# Patient Record
Sex: Female | Born: 1970 | Race: Black or African American | Hispanic: No | Marital: Single
Health system: Midwestern US, Community
[De-identification: ages and names within clinical notes are randomized; demographics above are authoritative.]

## PROBLEM LIST (undated history)

## (undated) DIAGNOSIS — M5126 Other intervertebral disc displacement, lumbar region: Secondary | ICD-10-CM

## (undated) DIAGNOSIS — I10 Essential (primary) hypertension: Secondary | ICD-10-CM

## (undated) DIAGNOSIS — D649 Anemia, unspecified: Secondary | ICD-10-CM

## (undated) DIAGNOSIS — R87619 Unspecified abnormal cytological findings in specimens from cervix uteri: Secondary | ICD-10-CM

## (undated) DIAGNOSIS — N3941 Urge incontinence: Principal | ICD-10-CM

## (undated) DIAGNOSIS — D509 Iron deficiency anemia, unspecified: Principal | ICD-10-CM

## (undated) DIAGNOSIS — E559 Vitamin D deficiency, unspecified: Secondary | ICD-10-CM

## (undated) DIAGNOSIS — R718 Other abnormality of red blood cells: Secondary | ICD-10-CM

## (undated) DIAGNOSIS — A6004 Herpesviral vulvovaginitis: Secondary | ICD-10-CM

## (undated) DIAGNOSIS — R928 Other abnormal and inconclusive findings on diagnostic imaging of breast: Secondary | ICD-10-CM

## (undated) DIAGNOSIS — R9341 Abnormal radiologic findings on diagnostic imaging of renal pelvis, ureter, or bladder: Secondary | ICD-10-CM

## (undated) DIAGNOSIS — Z0189 Encounter for other specified special examinations: Secondary | ICD-10-CM

## (undated) HISTORY — DX: Anemia, unspecified: D64.9

## (undated) HISTORY — DX: Unspecified abnormal cytological findings in specimens from cervix uteri: R87.619

---

## 1997-10-09 ENCOUNTER — Emergency Department (HOSPITAL_COMMUNITY): Admission: EM | Admit: 1997-10-09 | Discharge: 1997-10-09 | Payer: Self-pay

## 2002-10-27 LAB — HM PAP SMEAR: HM Pap smear: ABNORMAL

## 2003-03-22 HISTORY — PX: TUBAL LIGATION: SHX77

## 2003-04-20 ENCOUNTER — Inpatient Hospital Stay (HOSPITAL_COMMUNITY): Admission: AD | Admit: 2003-04-20 | Discharge: 2003-04-20 | Payer: Self-pay | Admitting: Family Medicine

## 2003-07-07 ENCOUNTER — Ambulatory Visit (HOSPITAL_COMMUNITY): Admission: RE | Admit: 2003-07-07 | Discharge: 2003-07-07 | Payer: Self-pay | Admitting: Obstetrics & Gynecology

## 2003-10-15 ENCOUNTER — Inpatient Hospital Stay (HOSPITAL_COMMUNITY): Admission: AD | Admit: 2003-10-15 | Discharge: 2003-10-15 | Payer: Self-pay | Admitting: Obstetrics & Gynecology

## 2003-11-06 ENCOUNTER — Inpatient Hospital Stay (HOSPITAL_COMMUNITY): Admission: AD | Admit: 2003-11-06 | Discharge: 2003-11-06 | Payer: Self-pay | Admitting: Obstetrics & Gynecology

## 2003-11-20 ENCOUNTER — Inpatient Hospital Stay (HOSPITAL_COMMUNITY): Admission: AD | Admit: 2003-11-20 | Discharge: 2003-11-24 | Payer: Self-pay | Admitting: Obstetrics & Gynecology

## 2003-11-21 ENCOUNTER — Encounter (INDEPENDENT_AMBULATORY_CARE_PROVIDER_SITE_OTHER): Payer: Self-pay | Admitting: Specialist

## 2010-02-14 ENCOUNTER — Emergency Department (HOSPITAL_COMMUNITY): Admission: EM | Admit: 2010-02-14 | Discharge: 2010-02-14 | Payer: Self-pay | Admitting: Emergency Medicine

## 2010-08-06 NOTE — Op Note (Signed)
Ruth Schmidt, Ruth Schmidt                          ACCOUNT NO.:  [id number]   MEDICAL RECORD NO.:  [id number]                   PATIENT TYPE:  INP   LOCATION:  9128                                 FACILITY:  [facility]   PHYSICIAN:  Roseanna Rainbow, M.D.         DATE OF BIRTH:  03-Feb-1971   DATE OF PROCEDURE:  11/21/2003  DATE OF DISCHARGE:                                 OPERATIVE REPORT   PREOPERATIVE DIAGNOSIS:  1.  Intrauterine pregnancy at term.  2.  Pregnancy induced hypertension.  3.  Suspicious fetal heart tracing.  4.  Multiparity, desires permanent sterilization.  5.  Latent labor.   POSTOPERATIVE DIAGNOSIS:  1.  Intrauterine pregnancy at term.  2.  Pregnancy induced hypertension.  3.  Suspicious fetal heart tracing.  4.  Multiparity, desires permanent sterilization.  5.  Latent labor.   PROCEDURE:  1.  Primary low uterine flap elective cesarean delivery.  2.  Modified Pomeroy bilateral tubal ligation via Pfannenstiel incision.   SURGEON:  Roseanna Rainbow, M.D.   ANESTHESIA:  Epidural.   ESTIMATED BLOOD LOSS:  800 mL.   FLUIDS/URINE OUTPUT:  As per anesthesiology.   COMPLICATIONS:  None.   DESCRIPTION OF PROCEDURE:  The patient was taken to the operating room with  an epidural catheter in place.  She was then placed in the dorsal supine  position with a leftward tilt and prepped and draped in the usual sterile  fashion.  A Pfannenstiel skin incision was then made with the scalpel and  carried down to the underlying layer of fascia.  The fascia was nicked in  the midline.  The fascial incision was then extended bilaterally with curved  Mayo scissors.  The superior aspect of the fascial incision was then grasped  with Kocher clamps and extended up in the underlying rectus muscles,  dissected up.  The inferior aspect of the fascial incision was then  manipulated in a similar fashion.  The rectus muscles were separated in the  midline.  The parietal peritoneum  was tented up and entered sharply.  The  peritoneal incision was then extended superiorly and inferiorly with good  visualization of the bladder. The bladder blade was then placed.  The  vesicouterine peritoneum was tented up and entered sharply.  The incision  was extended bilaterally and the bladder flap created bluntly.  The bladder  blade was then replaced.  The lower uterine segment was incised in a  transverse fashion with the scalpel.  The uterine incision was then extended  bluntly.  The infant's head was then delivered atraumatically.  The nose and  mouth were suctioned with the bulb suction.  A loose nuchal cord was noted  that was easily reduced.  The cord was clamped and cut.  The infant was  handed off to the waiting neonatologist.  Apgars were 9.  An umbilical  artery pH was 7.28.  The placenta was then removed.  The uterus was  exteriorized and evacuated of any remaining blood clots, amniotic fluid and  debris with a moistened laparotomy sponge.  The uterine incision was then  reapproximated in a running interlocking fashion.  Adequate hemostasis was  noted.  At this point, the mid isthmic portion of the left fallopian tube  was grasped with a Babcock clamp.  A 2-3 cm segment of tube was doubly  ligated with ties of plain gut and excised.  The right fallopian tube was  then manipulated in a similar fashion.  The uterus was returned to the  abdomen.  The pericolic gutters were then copiously irrigated.  The parietal  peritoneum was then reapproximated in a running fashion with 2-0 Vicryl.  The fascia was reapproximated in a running fashion with 0 Vicryl.  The skin  was reapproximated with staples.  At the close the procedure, the instrument  and pack counts were said to be correct x2.  The patient was taken to the  PACU, awake and in stable condition.                                               Roseanna Rainbow, M.D.    Judee Clara  D:  11/21/2003  T:  11/23/2003   Job:  [id number]

## 2010-08-06 NOTE — Discharge Summary (Signed)
Ruth Schmidt, Ruth Schmidt                ACCOUNT NO.:  [id number]   MEDICAL RECORD NO.:  [id number]          PATIENT TYPE:  INP   LOCATION:  9128                          FACILITY:  [facility]   PHYSICIAN:  Roseanna Rainbow, M.D.DATE OF BIRTH:  [birth year]   DATE OF ADMISSION:  11/20/2003  DATE OF DISCHARGE:  11/24/2003                                 DISCHARGE SUMMARY   CHIEF COMPLAINT:  The patient is a 40 year old para 2 with an estimated date  of confinement of December 06, 2003 with an intrauterine pregnancy at 55  and 5 weeks with elevated blood pressures.   HISTORY OF PRESENT ILLNESS:  The patient presented to the office complaining  of decreased fetal movement.  A subsequent nonstress test was nonreactive.  Blood pressures were elevated in the office with systolics in the 140s to  160s and diastolics in the 80s to 100s.  She complained of a mild headache  relieved with Tylenol.  She reported increased swelling in her lower  extremities and face.  Biophysical profile was 8/8 and an NST was reactive  in the maternity admissions unit on the day of admission.  Antepartum  course, problems, risks:  Pap smear with high-grade dysplasia.  Dating was  consistent with an ultrasound at 18 weeks 2 days.  GBS was positive on July  27.  Hemoglobin 11.6; platelets 216,000.  Blood type O positive, antibody  screen negative, RPR nonreactive, rubella immune, hepatitis B surface  antigen negative, HIV nonreactive, urine culture and sensitivity no growth,  Pap CIN-3.  Quad screen within normal limits.   PAST OBSTETRICAL AND GYNECOLOGICAL HISTORY:  In 1993 she was delivered of a  female, 5 pounds 11 ounces, spontaneous vaginal delivery full-term.  In 1998  she was delivered of a female, 6 pounds 11 ounces, full-term spontaneous  vaginal delivery.  She has had one voluntary termination of pregnancy.   PAST MEDICAL HISTORY:  She denies.   PAST SURGICAL HISTORY:  She denies.   SOCIAL HISTORY:  No  tobacco, ethanol, or drug use.   FAMILY HISTORY:  Hypertension, COPD.   MEDICATIONS:  Prenatal vitamins.   ALLERGIES:  No known drug allergies.   PHYSICAL EXAMINATION:  VITAL SIGNS:  Blood pressure is 140s to 160s over 80s  to 100s.  Fetal heart tracing reassuring.  Tocodynamometer with irregular  uterine contractions.  HEENT:  There is mild facial edema.  ABDOMEN:  Gravid.  PELVIC:  Sterile vaginal exam:  Cervix was fingertip to 1, 50%, with the  vertex at a -3 station.  EXTREMITIES:  Trace to 1+ lower extremity edema.  NEUROLOGIC:  Nonfocal.  Patellar reflexes were 1+ bilaterally.   LABORATORY DATA:  Urinalysis no proteinuria.  Complete metabolic profile  within normal limits.  Uric acid of 5.1.  Hemoglobin was 11.7 and platelets  were 176,000.   ASSESSMENT:  Multipara at 37 weeks with:  1.  Pregnancy-induced hypertension, rule out severe pregnancy-induced      hypertension; no evidence of HELLP syndrome.  2.  Unfavorable Bishop's.  3.  Reassuring fetal heart tracing.  4.  Group B  streptococcus positive.   PLAN:  Admission, induction of labor, penicillin GBS prophylaxis, possible  magnesium sulfate seizure prophylaxis.   HOSPITAL COURSE:  The patient was subsequently admitted and induction of  labor was initiated.  There was possible tachysystole with Cytotec and  efforts were made on intrauterine fetal resuscitation.  Her labor was  complicated by persistent suspicious fetal heart tracing.  She was brought  for a cesarean delivery and bilateral tubal ligation.  Please see the  dictated operative summary for further details.  Her postoperative course  was uneventful.  Her blood pressures remained in the normotensive range.  She was discharged to home on postoperative day #3 tolerating a regular  diet.   DISCHARGE DIAGNOSES:  1.  Mild preeclampsia.  2.  Intrauterine pregnancy at 37 weeks.  3.  Suspicious fetal heart tracing.  4.  Protracted latent phase of labor.    PROCEDURES:  Cesarean delivery and bilateral tubal ligation.   CONDITION:  Stable.   DIET:  Regular.   MEDICATIONS:  Resume home medications and Tylox.   ACTIVITY:  No strenuous activity, pelvic rest.   DISPOSITION:  The patient was to follow up in 1 week in the office.      LAJ/MEDQ  D:  12/10/2003  T:  12/11/2003  Job:  [id number]

## 2011-04-27 ENCOUNTER — Emergency Department (HOSPITAL_COMMUNITY): Payer: No Typology Code available for payment source

## 2011-04-27 ENCOUNTER — Encounter (HOSPITAL_COMMUNITY): Payer: Self-pay | Admitting: Emergency Medicine

## 2011-04-27 ENCOUNTER — Emergency Department (HOSPITAL_COMMUNITY)
Admission: EM | Admit: 2011-04-27 | Discharge: 2011-04-27 | Disposition: A | Discharge status: Home / Self Care | Payer: No Typology Code available for payment source | Attending: Emergency Medicine | Admitting: Emergency Medicine

## 2011-04-27 DIAGNOSIS — Z79899 Other long term (current) drug therapy: Secondary | ICD-10-CM | POA: Insufficient documentation

## 2011-04-27 DIAGNOSIS — I10 Essential (primary) hypertension: Secondary | ICD-10-CM | POA: Insufficient documentation

## 2011-04-27 DIAGNOSIS — M546 Pain in thoracic spine: Secondary | ICD-10-CM | POA: Insufficient documentation

## 2011-04-27 DIAGNOSIS — M542 Cervicalgia: Secondary | ICD-10-CM | POA: Insufficient documentation

## 2011-04-27 DIAGNOSIS — R209 Unspecified disturbances of skin sensation: Secondary | ICD-10-CM | POA: Insufficient documentation

## 2011-04-27 HISTORY — DX: Essential (primary) hypertension: I10

## 2011-04-27 MED ORDER — IBUPROFEN 800 MG PO TABS
800.0000 mg | ORAL_TABLET | Freq: Once | ORAL | Status: AC
  Administered 2011-04-27: 800 mg via ORAL
  Filled 2011-04-27: qty 1

## 2011-04-27 MED ORDER — NAPROXEN 500 MG PO TABS: 500.0000 mg | ORAL_TABLET | Freq: Two times a day (BID) | ORAL | Status: DC

## 2011-04-27 MED ORDER — TRAMADOL HCL 50 MG PO TABS: 50.0000 mg | ORAL_TABLET | Freq: Four times a day (QID) | ORAL | Status: AC | PRN

## 2011-04-27 MED ORDER — LORAZEPAM 1 MG PO TABS
1.0000 mg | ORAL_TABLET | Freq: Once | ORAL | Status: AC
  Administered 2011-04-27: 1 mg via ORAL
  Filled 2011-04-27: qty 1
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
## 2012-03-26 ENCOUNTER — Encounter (HOSPITAL_COMMUNITY): Payer: Self-pay | Admitting: *Deleted

## 2012-03-26 ENCOUNTER — Emergency Department (HOSPITAL_COMMUNITY)
Admission: EM | Admit: 2012-03-26 | Discharge: 2012-03-26 | Disposition: A | Discharge status: Home / Self Care | Payer: Self-pay | Attending: Emergency Medicine | Admitting: Emergency Medicine

## 2012-03-26 ENCOUNTER — Emergency Department (HOSPITAL_COMMUNITY): Payer: Self-pay

## 2012-03-26 DIAGNOSIS — R112 Nausea with vomiting, unspecified: Secondary | ICD-10-CM | POA: Insufficient documentation

## 2012-03-26 DIAGNOSIS — I10 Essential (primary) hypertension: Secondary | ICD-10-CM | POA: Insufficient documentation

## 2012-03-26 DIAGNOSIS — R109 Unspecified abdominal pain: Secondary | ICD-10-CM

## 2012-03-26 DIAGNOSIS — Z3202 Encounter for pregnancy test, result negative: Secondary | ICD-10-CM | POA: Insufficient documentation

## 2012-03-26 LAB — CBC WITH DIFFERENTIAL/PLATELET
Basophils Relative: 0 % (ref 0–1)
Eosinophils Absolute: 0 10*3/uL (ref 0.0–0.7)
Eosinophils Relative: 1 % (ref 0–5)
HCT: 33.4 % — ABNORMAL LOW (ref 36.0–46.0)
Hemoglobin: 10.6 g/dL — ABNORMAL LOW (ref 12.0–15.0)
MCH: 26.1 pg (ref 26.0–34.0)
MCHC: 31.7 g/dL (ref 30.0–36.0)
MCV: 82.3 fL (ref 78.0–100.0)
Monocytes Absolute: 0.5 10*3/uL (ref 0.1–1.0)
Monocytes Relative: 19 % — ABNORMAL HIGH (ref 3–12)

## 2012-03-26 LAB — COMPREHENSIVE METABOLIC PANEL
Alkaline Phosphatase: 44 U/L (ref 39–117)
BUN: 8 mg/dL (ref 6–23)
Calcium: 9.6 mg/dL (ref 8.4–10.5)
GFR calc Af Amer: >90 mL/min (ref >90)
Glucose, Bld: 97 mg/dL (ref 70–99)
Potassium: 4.1 mEq/L (ref 3.5–5.1)
Total Protein: 7.9 g/dL (ref 6.0–8.3)

## 2012-03-26 LAB — URINALYSIS, ROUTINE W REFLEX MICROSCOPIC
Leukocytes, UA: NEGATIVE
Nitrite: NEGATIVE
pH: 7 (ref 5.0–8.0)

## 2012-03-26 MED ORDER — SODIUM CHLORIDE 0.9 % IV BOLUS (SEPSIS)
1000.0000 mL | Freq: Once | INTRAVENOUS | Status: AC
  Administered 2012-03-26: 1000 mL via INTRAVENOUS

## 2012-03-26 MED ORDER — ONDANSETRON HCL 4 MG/2ML IJ SOLN
4.0000 mg | Freq: Once | INTRAMUSCULAR | Status: AC
  Administered 2012-03-26: 4 mg via INTRAVENOUS
  Filled 2012-03-26: qty 2

## 2012-03-26 MED ORDER — PANTOPRAZOLE SODIUM 40 MG IV SOLR
40.0000 mg | Freq: Once | INTRAVENOUS | Status: AC
  Administered 2012-03-26: 40 mg via INTRAVENOUS
  Filled 2012-03-26: qty 40

## 2012-03-26 MED ORDER — ONDANSETRON HCL 8 MG PO TABS: 8.0000 mg | ORAL_TABLET | Freq: Three times a day (TID) | ORAL | Status: DC | PRN

## 2012-03-26 NOTE — Progress Notes (Signed)
During her 03/26/12 [facility] ED visit, Pt listed with no insurance coverage. CM and [facility] liaison spoke with pt.  Pt offered services to assist with finding a [place] self pay provider & health reform information Pt states her coverage for Blue cross and blue shield will be on April 21 2012 and pcp is Tamela Oddi (CM unable to find pcp listed in EPIC)

## 2012-03-26 NOTE — ED Notes (Signed)
Pt reports generalized abd pain x4 days, worse since last night. +nausea, and "making herself vomit to feel better." Thought she had just ate something bad. Chills at home. Also reports spotting this am.

## 2012-03-26 NOTE — ED Provider Notes (Signed)
History     CSN: [id number]  Arrival date & time 03/26/12  0744   First MD Initiated Contact with Patient 03/26/12 [phone]      Chief Complaint  Patient presents with  . Abdominal Pain    (Consider location/radiation/quality/duration/timing/severity/associated sxs/prior treatment) Patient is a 42 y.o. female presenting with abdominal pain. The history is provided by the patient.  Abdominal Pain The primary symptoms of the illness include abdominal pain. The primary symptoms of the illness do not include fever or shortness of breath.  Symptoms associated with the illness do not include chills or back pain.  pt c/o mid/diffuse abd crampy pain for past few days w intermittent nv. Emesis color of recent ingested food/drink, then sl yellowish. No bloody or bilious emesis. Having normal bms. No diarrhea or constipation.  No focal or constant abd pain. No radiation of pain or back/flank pain. No specific exacerbating or alleviating factors. No fever or chills. No dysuria or gu c/o. No vaginal discharge or bleeding. lnmp 2 weeks ago. No hx pud, gallstones, or pancreatitis. Only prior abd surgery was c section.   Past Medical History  Diagnosis Date  . Hypertension     Past Surgical History  Procedure Date  . Cesarean section     No family history on file.  History  Substance Use Topics  . Smoking status: Never Smoker   . Smokeless tobacco: Not on file  . Alcohol Use: Yes     Comment: occasional    OB History    Grav Para Term Preterm Abortions TAB SAB Ect Mult Living                  Review of Systems  Constitutional: Negative for fever and chills.  HENT: Negative for neck pain.   Eyes: Negative for redness.  Respiratory: Negative for shortness of breath.   Cardiovascular: Negative for chest pain.  Gastrointestinal: Positive for abdominal pain.  Genitourinary: Negative for flank pain.  Musculoskeletal: Negative for back pain.  Skin: Negative for rash.  Neurological:  Negative for headaches.  Hematological: Does not bruise/bleed easily.  Psychiatric/Behavioral: Negative for confusion.    Allergies  Review of patient's allergies indicates no known allergies.  Home Medications   Current Outpatient Rx  Name  Route  Sig  Dispense  Refill  . BISMUTH SUBSALICYLATE 262 MG/15ML PO SUSP   Oral   Take 15 mLs by mouth every 6 (six) hours as needed. For symptoms.         Marland Kitchen ALKA-SELTZER PLUS MUCUS & CONG PO   Oral   Take 2 capsules by mouth at bedtime as needed. For symptoms.           BP 155/97  Pulse 90  Temp 98.2 F (36.8 C) (Oral)  Resp 14  SpO2 100%  LMP 03/26/2012  Physical Exam  Nursing note and vitals reviewed. Constitutional: She is oriented to person, place, and time. She appears well-developed and well-nourished. No distress.  HENT:  Mouth/Throat: Oropharynx is clear and moist.  Eyes: Conjunctivae normal are normal. No scleral icterus.  Neck: Neck supple. No tracheal deviation present.  Cardiovascular: Normal rate, regular rhythm, normal heart sounds and intact distal pulses.   Pulmonary/Chest: Effort normal and breath sounds normal. No respiratory distress.  Abdominal: Soft. Normal appearance and bowel sounds are normal. She exhibits no distension and no mass. There is no tenderness. There is no rebound and no guarding.  Genitourinary:       No cva tenderness  Musculoskeletal: She exhibits no edema and no tenderness.  Neurological: She is alert and oriented to person, place, and time.  Skin: Skin is warm and dry. No rash noted.  Psychiatric: She has a normal mood and affect.    ED Course  Procedures (including critical care time)  Results for orders placed during the hospital encounter of 03/26/12  CBC WITH DIFFERENTIAL      Component Value Range   WBC 2.5 (*) 4.0 - 10.5 K/uL   RBC 4.06  3.87 - 5.11 MIL/uL   Hemoglobin 10.6 (*) 12.0 - 15.0 g/dL   HCT 09.8 (*) 11.9 - 14.7 %   MCV 82.3  78.0 - 100.0 fL   MCH 26.1  26.0 -  34.0 pg   MCHC 31.7  30.0 - 36.0 g/dL   RDW 82.9 (*) 56.2 - 13.0 %   Platelets 297  150 - 400 K/uL   Neutrophils Relative 54  43 - 77 %   Neutro Abs 1.4 (*) 1.7 - 7.7 K/uL   Lymphocytes Relative 25  12 - 46 %   Lymphs Abs 0.6 (*) 0.7 - 4.0 K/uL   Monocytes Relative 19 (*) 3 - 12 %   Monocytes Absolute 0.5  0.1 - 1.0 K/uL   Eosinophils Relative 1  0 - 5 %   Eosinophils Absolute 0.0  0.0 - 0.7 K/uL   Basophils Relative 0  0 - 1 %   Basophils Absolute 0.0  0.0 - 0.1 K/uL  LIPASE, BLOOD      Component Value Range   Lipase 26  11 - 59 U/L  COMPREHENSIVE METABOLIC PANEL      Component Value Range   Sodium 133 (*) 135 - 145 mEq/L   Potassium 4.1  3.5 - 5.1 mEq/L   Chloride 99  96 - 112 mEq/L   CO2 24  19 - 32 mEq/L   Glucose, Bld 97  70 - 99 mg/dL   BUN 8  6 - 23 mg/dL   Creatinine, Ser 8.65  0.50 - 1.10 mg/dL   Calcium 9.6  8.4 - 78.4 mg/dL   Total Protein 7.9  6.0 - 8.3 g/dL   Albumin 3.8  3.5 - 5.2 g/dL   AST 21  0 - 37 U/L   ALT 15  0 - 35 U/L   Alkaline Phosphatase 44  39 - 117 U/L   Total Bilirubin 0.4  0.3 - 1.2 mg/dL   GFR calc non Af Amer >90  >90 mL/min   GFR calc Af Amer >90  >90 mL/min  URINALYSIS, ROUTINE W REFLEX MICROSCOPIC      Component Value Range   Color, Urine YELLOW  YELLOW   APPearance CLEAR  CLEAR   Specific Gravity, Urine 1.025  1.005 - 1.030   pH 7.0  5.0 - 8.0   Glucose, UA NEGATIVE  NEGATIVE mg/dL   Hgb urine dipstick NEGATIVE  NEGATIVE   Bilirubin Urine NEGATIVE  NEGATIVE   Ketones, ur TRACE (*) NEGATIVE mg/dL   Protein, ur NEGATIVE  NEGATIVE mg/dL   Urobilinogen, UA 1.0  0.0 - 1.0 mg/dL   Nitrite NEGATIVE  NEGATIVE   Leukocytes, UA NEGATIVE  NEGATIVE  PREGNANCY, URINE      Component Value Range   Preg Test, Ur NEGATIVE  NEGATIVE   US Abdomen Complete  03/26/2012  *RADIOLOGY REPORT*  Clinical Data:  Abdominal pain.  Evaluate for gallstones.  COMPLETE ABDOMINAL ULTRASOUND  Comparison:  No priors.  Findings:  Gallbladder:  No  shadowing  gallstones or echogenic sludge.  No gallbladder wall thickening or pericholecystic fluid.  Negative sonographic Murphy's sign according to the ultrasound technologist.  Common bile duct:  Normal caliber measuring 3.6 mm in the porta hepatis.  Liver:  Normal size and echotexture without focal parenchymal abnormality.  Patent portal vein with hepatopetal flow.  IVC:  Patent throughout its visualized course in the abdomen.  Pancreas:  Although the pancreas is difficult to visualize in its entirety, no focal pancreatic abnormality is identified.  Spleen:  Normal size and echotexture without focal parenchymal abnormality.5.8 cm in length.  Right Kidney:  No hydronephrosis.  Well-preserved cortex.  Normal size and parenchymal echotexture without focal abnormalities. 11.3 cm in length.  Left Kidney:  No hydronephrosis.  Well-preserved cortex.  Normal size and parenchymal echotexture without focal abnormalities. 11.7 cm in length.  Abdominal aorta:  Normal caliber measuring 1.9 cm proximally, and tapering appropriately distally.  IMPRESSION: 1.  No acute findings in the abdomen to account for the patient's symptoms.  Specifically, no evidence of gallstones or findings to suggest acute cholecystitis at this time.   Original Report Authenticated By: Trudie Reed, M.D.       MDM  Iv ns bolus. zofran iv.  Reviewed nursing notes and prior charts for additional history.   2nd liter ns. protonix iv.  Pt drove self to ed.  Recheck abd soft no tenderness w light/deep palpation.   Recheck no recurrent nv. abd soft nt. Discussed labs and u/s w pt.         Suzi Roots, MD 03/26/12 1122

## 2012-06-13 ENCOUNTER — Ambulatory Visit: Payer: Self-pay | Admitting: Obstetrics & Gynecology

## 2012-06-14 ENCOUNTER — Other Ambulatory Visit: Payer: Self-pay

## 2012-06-14 DIAGNOSIS — Z1231 Encounter for screening mammogram for malignant neoplasm of breast: Secondary | ICD-10-CM

## 2012-06-27 ENCOUNTER — Ambulatory Visit
Admission: RE | Admit: 2012-06-27 | Discharge: 2012-06-27 | Disposition: A | Discharge status: Home / Self Care | Payer: BC Managed Care – PPO | Source: Ambulatory Visit

## 2012-06-27 DIAGNOSIS — Z1231 Encounter for screening mammogram for malignant neoplasm of breast: Secondary | ICD-10-CM

## 2012-06-28 ENCOUNTER — Other Ambulatory Visit: Payer: Self-pay | Admitting: Obstetrics & Gynecology

## 2012-06-28 DIAGNOSIS — R928 Other abnormal and inconclusive findings on diagnostic imaging of breast: Secondary | ICD-10-CM

## 2012-07-09 ENCOUNTER — Encounter: Payer: Self-pay | Admitting: Obstetrics & Gynecology

## 2012-07-11 ENCOUNTER — Ambulatory Visit
Admission: RE | Admit: 2012-07-11 | Discharge: 2012-07-11 | Disposition: A | Discharge status: Home / Self Care | Payer: BC Managed Care – PPO | Source: Ambulatory Visit | Attending: Obstetrics & Gynecology | Admitting: Obstetrics & Gynecology

## 2012-07-11 DIAGNOSIS — R928 Other abnormal and inconclusive findings on diagnostic imaging of breast: Secondary | ICD-10-CM

## 2012-07-11 LAB — HM MAMMOGRAPHY: HM Mammogram: NORMAL

## 2012-07-23 ENCOUNTER — Encounter: Payer: Self-pay | Admitting: Obstetrics & Gynecology

## 2012-07-23 ENCOUNTER — Ambulatory Visit: Payer: Self-pay | Admitting: Obstetrics & Gynecology

## 2012-07-23 ENCOUNTER — Ambulatory Visit (INDEPENDENT_AMBULATORY_CARE_PROVIDER_SITE_OTHER): Payer: BC Managed Care – PPO | Admitting: Obstetrics & Gynecology

## 2012-07-23 VITALS — BP 142/85 | HR 92 | Temp 97.6°F | Ht 63.0 in | Wt 140.0 lb

## 2012-07-23 DIAGNOSIS — Z01419 Encounter for gynecological examination (general) (routine) without abnormal findings: Secondary | ICD-10-CM

## 2012-07-23 NOTE — Progress Notes (Signed)
Subjective:     Ruth Schmidt is a 42 y.o. female here for a routine exam.  Current complaints: none.  Personal health questionnaire reviewed: not asked.   Gynecologic History Patient's last menstrual period was 07/17/2012. Contraception: tubal ligation Last Pap: 2004. Results were: abnormal Last mammogram: 07/11/2012. Results were: normal   The following portions of the patient's history were reviewed and updated as appropriate: allergies, current medications, past family history, past medical history, past social history, past surgical history and problem list.  Review of Systems Pertinent items are noted in HPI.    Objective:    General appearance: alert Breasts: normal appearance, no masses or tenderness Abdomen: soft, non-tender; bowel sounds normal; no masses,  no organomegaly Pelvic: cervix normal in appearance, external genitalia normal, no adnexal masses or tenderness, uterus normal size, irregular shape, small anterior myoma palpated and and vagina normal without discharge    Informal U/S confirmed the pelvic exam  Assessment:    Healthy female exam.    Plan:    Education reviewed: f/u w/a primary care provider.

## 2012-07-23 NOTE — Patient Instructions (Addendum)

## 2012-07-24 ENCOUNTER — Encounter: Payer: Self-pay | Admitting: Obstetrics & Gynecology

## 2012-07-24 DIAGNOSIS — Z0001 Encounter for general adult medical examination with abnormal findings: Secondary | ICD-10-CM | POA: Insufficient documentation

## 2012-07-24 DIAGNOSIS — Z0000 Encounter for general adult medical examination without abnormal findings: Secondary | ICD-10-CM | POA: Insufficient documentation

## 2012-07-24 LAB — RPR

## 2012-07-24 LAB — PAP IG, CT-NG NAA, HPV HIGH-RISK: HPV DNA High Risk: NOT DETECTED

## 2012-12-19 ENCOUNTER — Ambulatory Visit: Payer: BC Managed Care – PPO | Admitting: Internal Medicine

## 2013-01-04 ENCOUNTER — Ambulatory Visit (INDEPENDENT_AMBULATORY_CARE_PROVIDER_SITE_OTHER): Payer: BC Managed Care – PPO | Admitting: Internal Medicine

## 2013-01-04 ENCOUNTER — Encounter: Payer: Self-pay | Admitting: Internal Medicine

## 2013-01-04 ENCOUNTER — Other Ambulatory Visit (INDEPENDENT_AMBULATORY_CARE_PROVIDER_SITE_OTHER): Payer: BC Managed Care – PPO

## 2013-01-04 VITALS — BP 150/110 | HR 99 | Temp 98.6°F | Ht 63.0 in | Wt 146.0 lb

## 2013-01-04 DIAGNOSIS — I10 Essential (primary) hypertension: Secondary | ICD-10-CM

## 2013-01-04 DIAGNOSIS — Z0000 Encounter for general adult medical examination without abnormal findings: Secondary | ICD-10-CM

## 2013-01-04 DIAGNOSIS — D649 Anemia, unspecified: Secondary | ICD-10-CM

## 2013-01-04 DIAGNOSIS — D509 Iron deficiency anemia, unspecified: Secondary | ICD-10-CM | POA: Insufficient documentation

## 2013-01-04 LAB — CBC WITH DIFFERENTIAL/PLATELET
Basophils Relative: 0.6 % (ref 0.0–3.0)
Eosinophils Relative: 0.7 % (ref 0.0–5.0)
HCT: 32.3 % — ABNORMAL LOW (ref 36.0–46.0)
Lymphs Abs: 1.1 10*3/uL (ref 0.7–4.0)
MCHC: 31.6 g/dL (ref 30.0–36.0)
MCV: 85.8 fl (ref 78.0–100.0)
Monocytes Absolute: 0.4 10*3/uL (ref 0.1–1.0)
Monocytes Relative: 10.8 % (ref 3.0–12.0)
Neutrophils Relative %: 61.4 % (ref 43.0–77.0)
Platelets: 275 10*3/uL (ref 150.0–400.0)
RBC: 3.77 Mil/uL — ABNORMAL LOW (ref 3.87–5.11)
WBC: 4.1 10*3/uL — ABNORMAL LOW (ref 4.5–10.5)

## 2013-01-04 LAB — HEPATIC FUNCTION PANEL
AST: 20 U/L (ref 0–37)
Albumin: 4.1 g/dL (ref 3.5–5.2)
Alkaline Phosphatase: 42 U/L (ref 39–117)
Bilirubin, Direct: 0.1 mg/dL (ref 0.0–0.3)
Total Bilirubin: 0.6 mg/dL (ref 0.3–1.2)
Total Protein: 7.8 g/dL (ref 6.0–8.3)

## 2013-01-04 LAB — URINALYSIS, ROUTINE W REFLEX MICROSCOPIC
Ketones, ur: NEGATIVE
Leukocytes, UA: NEGATIVE
Nitrite: NEGATIVE
Specific Gravity, Urine: 1.025 (ref 1.000–1.030)
Urine Glucose: NEGATIVE
pH: 5.5 (ref 5.0–8.0)

## 2013-01-04 LAB — BASIC METABOLIC PANEL
BUN: 7 mg/dL (ref 6–23)
CO2: 27 mEq/L (ref 19–32)
Chloride: 103 mEq/L (ref 96–112)
Potassium: 3.8 mEq/L (ref 3.5–5.1)

## 2013-01-04 LAB — VITAMIN B12: Vitamin B-12: 739 pg/mL (ref 211–911)

## 2013-01-04 LAB — LIPID PANEL
Cholesterol: 206 mg/dL — ABNORMAL HIGH (ref 0–200)
HDL: 68.4 mg/dL (ref >39.00)
VLDL: 15 mg/dL (ref 0.0–40.0)

## 2013-01-04 LAB — TSH: TSH: 1.7 u[IU]/mL (ref 0.35–5.50)

## 2013-01-04 MED ORDER — LOSARTAN POTASSIUM-HCTZ 100-25 MG PO TABS: 1.0000 | ORAL_TABLET | Freq: Every day | ORAL | Status: DC

## 2013-01-04 NOTE — Assessment & Plan Note (Signed)
Reports history of menorrhagia Recheck CBC now with iron studies

## 2013-01-04 NOTE — Progress Notes (Signed)
Subjective:    Patient ID: Ruth Schmidt, female    DOB: 29-Jan-1971, 42 y.o.   MRN: [id number]  HPI  New patient to me and our practice, here to establish primary care provider Here for annual physical and labs  Also notes concern about elevated blood pressure History of preeclampsia during her pregnancy in 2005 Strong family history of hypertension and reports blood pressure "high all the time" but has never been on medication for same  Past Medical History  Diagnosis Date  . Hypertension   . Abnormal Pap smear of cervix    Family History  Problem Relation Age of Onset  . Cancer    . Hypertension Mother   . Hypertension Maternal Grandmother   . Diabetes Father   . Hyperlipidemia Father   . Diabetes Paternal Grandmother   . Stroke Maternal Aunt 65  . Coronary artery disease Maternal Grandfather 47    MI, sudden death  . Lung cancer Maternal Uncle   . Colon cancer Maternal Uncle    History  Substance Use Topics  . Smoking status: Never Smoker   . Smokeless tobacco: Never Used  . Alcohol Use: Yes     Comment: wine    Review of Systems  Constitutional: Negative for fatigue and unexpected weight change.  Respiratory: Negative for cough, shortness of breath and wheezing.   Cardiovascular: Negative for chest pain, palpitations and leg swelling.  Gastrointestinal: Negative for nausea, abdominal pain and diarrhea.  Neurological: Negative for dizziness, weakness, light-headedness and headaches.  Psychiatric/Behavioral: Negative for dysphoric mood. The patient is not nervous/anxious.   All other systems reviewed and are negative.       Objective:   Physical Exam BP 150/110  Pulse 99  Temp(Src) 98.6 F (37 C) (Oral)  Ht 5\' 3"  (1.6 m)  Wt 146 lb (66.226 kg)  BMI 25.87 kg/m2  SpO2 97% Wt Readings from Last 3 Encounters:  01/04/13 146 lb (66.226 kg)  07/23/12 140 lb (63.504 kg)   Constitutional: She appears well-developed and well-nourished. No distress.  HENT:  Head: Normocephalic and atraumatic. Ears: B TMs ok, no erythema or effusion; Nose: Nose normal. Mouth/Throat: Oropharynx is clear and moist. No oropharyngeal exudate.  Eyes: Conjunctivae and EOM are normal. Pupils are equal, round, and reactive to light. No scleral icterus.  Neck: Normal range of motion. Neck supple. No JVD present. No thyromegaly present.  Cardiovascular: Normal rate, regular rhythm and normal heart sounds.  No murmur heard. No BLE edema. Pulmonary/Chest: Effort normal and breath sounds normal. No respiratory distress. She has no wheezes.  Abdominal: Soft. Bowel sounds are normal. She exhibits no distension. There is no tenderness. no masses Musculoskeletal: Normal range of motion, no joint effusions. No gross deformities Neurological: She is alert and oriented to person, place, and time. No cranial nerve deficit. Coordination, balance, strength, speech and gait are normal.  Skin: Skin is warm and dry. No rash noted. No erythema.  Psychiatric: She has a normal mood and affect. Her behavior is normal. Judgment and thought content normal.   Lab Results  Component Value Date   WBC 2.5* 03/26/2012   HGB 10.6* 03/26/2012   HCT 33.4* 03/26/2012   PLT 297 03/26/2012   GLUCOSE 97 03/26/2012   ALT 15 03/26/2012   AST 21 03/26/2012   NA 133* 03/26/2012   K 4.1 03/26/2012   CL 99 03/26/2012   CREATININE 0.59 03/26/2012   BUN 8 03/26/2012   CO2 24 03/26/2012  Assessment & Plan:  Assessment and plan:  CPX/v70.0 - Patient has been counseled on age-appropriate routine health concerns for screening and prevention. These are reviewed and up-to-date. Immunizations are up-to-date or declined. Labs ordered and reviewed.  Also See problem list. Medications and labs reviewed today.

## 2013-01-04 NOTE — Assessment & Plan Note (Signed)
BP Readings from Last 3 Encounters:  01/04/13 150/110  07/23/12 142/85  03/26/12 141/93   Long-standing history of same but never on medication therapy History of preeclampsia 2005 Strong family history of hypertension and stroke, no renal disease Start combination therapy with diuretic now Check labs including urinalysis Followup in 3-4 weeks to recheck blood pressure and titrate/adjust as needed we reviewed potential risk/benefit and possible side effects - pt understands and agrees to same

## 2013-01-04 NOTE — Patient Instructions (Addendum)
It was good to see you today.  We have reviewed your prior records including labs and tests today  Health Maintenance reviewed - all recommended immunizations and age-appropriate screenings are up-to-date or declined. Consider flu immunization as discussed.  Test(s) ordered today. Your results will be released to MyChart (or called to you) after review, usually within 72hours after test completion. If any changes need to be made, you will be notified at that same time.  Medications reviewed and updated: Start losartan-hctz once daily each morning for blood pressure control  Your prescription(s) have been submitted to your pharmacy. Please take as directed and contact our office if you believe you are having problem(s) with the medication(s).  Followup in 3-4 weeks to recheck blood pressure and adjust medications as needed, please call sooner if problems  Health Maintenance, Females A healthy lifestyle and preventative care can promote health and wellness.  Maintain regular health, dental, and eye exams.  Eat a healthy diet. Foods like vegetables, fruits, whole grains, low-fat dairy products, and lean protein foods contain the nutrients you need without too many calories. Decrease your intake of foods high in solid fats, added sugars, and salt. Get information about a proper diet from your caregiver, if necessary.  Regular physical exercise is one of the most important things you can do for your health. Most adults should get at least 150 minutes of moderate-intensity exercise (any activity that increases your heart rate and causes you to sweat) each week. In addition, most adults need muscle-strengthening exercises on 2 or more days a week.   Maintain a healthy weight. The body mass index (BMI) is a screening tool to identify possible weight problems. It provides an estimate of body fat based on height and weight. Your caregiver can help determine your BMI, and can help you achieve or  maintain a healthy weight. For adults 20 years and older:  A BMI below 18.5 is considered underweight.  A BMI of 18.5 to 24.9 is normal.  A BMI of 25 to 29.9 is considered overweight.  A BMI of 30 and above is considered obese.  Maintain normal blood lipids and cholesterol by exercising and minimizing your intake of saturated fat. Eat a balanced diet with plenty of fruits and vegetables. Blood tests for lipids and cholesterol should begin at age 35 and be repeated every 5 years. If your lipid or cholesterol levels are high, you are over 50, or you are a high risk for heart disease, you may need your cholesterol levels checked more frequently.Ongoing high lipid and cholesterol levels should be treated with medicines if diet and exercise are not effective.  If you smoke, find out from your caregiver how to quit. If you do not use tobacco, do not start.  If you are pregnant, do not drink alcohol. If you are breastfeeding, be very cautious about drinking alcohol. If you are not pregnant and choose to drink alcohol, do not exceed 1 drink per day. One drink is considered to be 12 ounces (355 mL) of beer, 5 ounces (148 mL) of wine, or 1.5 ounces (44 mL) of liquor.  Avoid use of street drugs. Do not share needles with anyone. Ask for help if you need support or instructions about stopping the use of drugs.  High blood pressure causes heart disease and increases the risk of stroke. Blood pressure should be checked at least every 1 to 2 years. Ongoing high blood pressure should be treated with medicines, if weight loss and exercise are  not effective.  If you are 16 to 41 years old, ask your caregiver if you should take aspirin to prevent strokes.  Diabetes screening involves taking a blood sample to check your fasting blood sugar level. This should be done once every 3 years, after age 12, if you are within normal weight and without risk factors for diabetes. Testing should be considered at a younger  age or be carried out more frequently if you are overweight and have at least 1 risk factor for diabetes.  Breast cancer screening is essential preventative care for women. You should practice "breast self-awareness." This means understanding the normal appearance and feel of your breasts and may include breast self-examination. Any changes detected, no matter how small, should be reported to a caregiver. Women in their 4s and 30s should have a clinical breast exam (CBE) by a caregiver as part of a regular health exam every 1 to 3 years. After age 70, women should have a CBE every year. Starting at age 47, women should consider having a mammogram (breast X-ray) every year. Women who have a family history of breast cancer should talk to their caregiver about genetic screening. Women at a high risk of breast cancer should talk to their caregiver about having an MRI and a mammogram every year.  The Pap test is a screening test for cervical cancer. Women should have a Pap test starting at age 28. Between ages 38 and 61, Pap tests should be repeated every 2 years. Beginning at age 66, you should have a Pap test every 3 years as long as the past 3 Pap tests have been normal. If you had a hysterectomy for a problem that was not cancer or a condition that could lead to cancer, then you no longer need Pap tests. If you are between ages 54 and 80, and you have had normal Pap tests going back 10 years, you no longer need Pap tests. If you have had past treatment for cervical cancer or a condition that could lead to cancer, you need Pap tests and screening for cancer for at least 20 years after your treatment. If Pap tests have been discontinued, risk factors (such as a new sexual partner) need to be reassessed to determine if screening should be resumed. Some women have medical problems that increase the chance of getting cervical cancer. In these cases, your caregiver may recommend more frequent screening and Pap  tests.  The human papillomavirus (HPV) test is an additional test that may be used for cervical cancer screening. The HPV test looks for the virus that can cause the cell changes on the cervix. The cells collected during the Pap test can be tested for HPV. The HPV test could be used to screen women aged 34 years and older, and should be used in women of any age who have unclear Pap test results. After the age of 49, women should have HPV testing at the same frequency as a Pap test.  Colorectal cancer can be detected and often prevented. Most routine colorectal cancer screening begins at the age of 52 and continues through age 69. However, your caregiver may recommend screening at an earlier age if you have risk factors for colon cancer. On a yearly basis, your caregiver may provide home test kits to check for hidden blood in the stool. Use of a small camera at the end of a tube, to directly examine the colon (sigmoidoscopy or colonoscopy), can detect the earliest forms of colorectal cancer.  Talk to your caregiver about this at age 41, when routine screening begins. Direct examination of the colon should be repeated every 5 to 10 years through age 31, unless early forms of pre-cancerous polyps or small growths are found.  Hepatitis C blood testing is recommended for all people born from [birth year] through 1964 and any individual with known risks for hepatitis C.  Practice safe sex. Use condoms and avoid high-risk sexual practices to reduce the spread of sexually transmitted infections (STIs). Sexually active women aged 67 and younger should be checked for Chlamydia, which is a common sexually transmitted infection. Older women with new or multiple partners should also be tested for Chlamydia. Testing for other STIs is recommended if you are sexually active and at increased risk.  Osteoporosis is a disease in which the bones lose minerals and strength with aging. This can result in serious bone fractures. The risk  of osteoporosis can be identified using a bone density scan. Women ages 53 and over and women at risk for fractures or osteoporosis should discuss screening with their caregivers. Ask your caregiver whether you should be taking a calcium supplement or vitamin D to reduce the rate of osteoporosis.  Menopause can be associated with physical symptoms and risks. Hormone replacement therapy is available to decrease symptoms and risks. You should talk to your caregiver about whether hormone replacement therapy is right for you.  Use sunscreen with a sun protection factor (SPF) of 30 or greater. Apply sunscreen liberally and repeatedly throughout the day. You should seek shade when your shadow is shorter than you. Protect yourself by wearing long sleeves, pants, a wide-brimmed hat, and sunglasses year round, whenever you are outdoors.  Notify your caregiver of new moles or changes in moles, especially if there is a change in shape or color. Also notify your caregiver if a mole is larger than the size of a pencil eraser.  Stay current with your immunizations. Document Released: 09/20/2010 Document Revised: 05/30/2011 Document Reviewed: 09/20/2010 [facility] Patient Information 2014 [place], [place]. DASH Diet The DASH diet stands for "Dietary Approaches to Stop Hypertension." It is a healthy eating plan that has been shown to reduce high blood pressure (hypertension) in as little as 14 days, while also possibly providing other significant health benefits. These other health benefits include reducing the risk of breast cancer after menopause and reducing the risk of type 2 diabetes, heart disease, colon cancer, and stroke. Health benefits also include weight loss and slowing kidney failure in patients with chronic kidney disease.  DIET GUIDELINES  Limit salt (sodium). Your diet should contain less than 1500 mg of sodium daily.  Limit refined or processed carbohydrates. Your diet should include mostly whole  grains. Desserts and added sugars should be used sparingly.  Include small amounts of heart-healthy fats. These types of fats include nuts, oils, and tub margarine. Limit saturated and trans fats. These fats have been shown to be harmful in the body. CHOOSING FOODS  The following food groups are based on a 2000 calorie diet. See your Registered Dietitian for individual calorie needs. Grains and Grain Products (6 to 8 servings daily)  Eat More Often: Whole-wheat bread, brown rice, whole-grain or wheat pasta, quinoa, popcorn without added fat or salt (air popped).  Eat Less Often: White bread, white pasta, white rice, cornbread. Vegetables (4 to 5 servings daily)  Eat More Often: Fresh, frozen, and canned vegetables. Vegetables may be raw, steamed, roasted, or grilled with a minimal amount of fat.  Eat  Less Often/Avoid: Creamed or fried vegetables. Vegetables in a cheese sauce. Fruit (4 to 5 servings daily)  Eat More Often: All fresh, canned (in natural juice), or frozen fruits. Dried fruits without added sugar. One hundred percent fruit juice ( cup [237 mL] daily).  Eat Less Often: Dried fruits with added sugar. Canned fruit in light or heavy syrup. [employer], Fish, and Poultry (2 servings or less daily. One serving is 3 to 4 oz [85-114 g]).  Eat More Often: Ninety percent or leaner ground beef, tenderloin, sirloin. Round cuts of beef, chicken breast, [place] breast. All fish. Grill, bake, or broil your meat. Nothing should be fried.  Eat Less Often/Avoid: Fatty cuts of meat, [place], or chicken leg, thigh, or wing. Fried cuts of meat or fish. Dairy (2 to 3 servings)  Eat More Often: Low-fat or fat-free milk, low-fat plain or light yogurt, reduced-fat or part-skim cheese.  Eat Less Often/Avoid: Milk (whole, 2%).Whole milk yogurt. Full-fat cheeses. Nuts, Seeds, and Legumes (4 to 5 servings per week)  Eat More Often: All without added salt.  Eat Less Often/Avoid: Salted nuts and  seeds, canned beans with added salt. Fats and Sweets (limited)  Eat More Often: Vegetable oils, tub margarines without trans fats, sugar-free gelatin. Mayonnaise and salad dressings.  Eat Less Often/Avoid: Coconut oils, palm oils, butter, stick margarine, cream, half and half, cookies, candy, pie. FOR MORE INFORMATION The Dash Diet Eating Plan: www.dashdiet.org Document Released: 02/24/2011 Document Revised: 05/30/2011 Document Reviewed: 02/24/2011 [facility] Patient Information 2014 [place], [place]. Hypertension Hypertension is another name for high blood pressure. High blood pressure may mean that your heart needs to work harder to pump blood. Blood pressure consists of two numbers, which includes a higher number over a lower number (example: 110/72). HOME CARE   Make lifestyle changes as told by your doctor. This may include weight loss and exercise.  Take your blood pressure medicine every day.  Limit how much salt you use.  Stop smoking if you smoke.  Do not use drugs.  Talk to your doctor if you are using decongestants or birth control pills. These medicines might make blood pressure higher.  Females should not drink more than 1 alcoholic drink per day. Males should not drink more than 2 alcoholic drinks per day.  See your doctor as told. GET HELP RIGHT AWAY IF:   You have a blood pressure reading with a top number of 180 or higher.  You get a very bad headache.  You get blurred or changing vision.  You feel confused.  You feel weak, numb, or faint.  You get chest or belly (abdominal) pain.  You throw up (vomit).  You cannot breathe very well. MAKE SURE YOU:   Understand these instructions.  Will watch your condition.  Will get help right away if you are not doing well or get worse. Document Released: 08/24/2007 Document Revised: 05/30/2011 Document Reviewed: 08/24/2007 [facility] Patient Information 2014 [place], [place].

## 2013-01-28 ENCOUNTER — Encounter: Payer: Self-pay | Admitting: Internal Medicine

## 2013-01-28 ENCOUNTER — Ambulatory Visit (INDEPENDENT_AMBULATORY_CARE_PROVIDER_SITE_OTHER): Payer: BC Managed Care – PPO | Admitting: Internal Medicine

## 2013-01-28 VITALS — BP 132/82 | HR 89 | Temp 97.4°F | Wt 143.2 lb

## 2013-01-28 DIAGNOSIS — B360 Pityriasis versicolor: Secondary | ICD-10-CM

## 2013-01-28 DIAGNOSIS — I10 Essential (primary) hypertension: Secondary | ICD-10-CM

## 2013-01-28 DIAGNOSIS — L819 Disorder of pigmentation, unspecified: Secondary | ICD-10-CM

## 2013-01-28 DIAGNOSIS — D509 Iron deficiency anemia, unspecified: Secondary | ICD-10-CM

## 2013-01-28 DIAGNOSIS — L811 Chloasma: Secondary | ICD-10-CM

## 2013-01-28 MED ORDER — NAVA-~~LOC~~ 4 % EX CREA: 1.0000 "application " | TOPICAL_CREAM | Freq: Every day | CUTANEOUS | Status: DC

## 2013-01-28 MED ORDER — KETOCONAZOLE 2 % EX CREA: 1.0000 "application " | TOPICAL_CREAM | Freq: Every day | CUTANEOUS | Status: DC

## 2013-01-28 NOTE — Assessment & Plan Note (Signed)
BP Readings from Last 3 Encounters:  01/28/13 132/82  01/04/13 150/110  07/23/12 142/85   Long-standing history of same but never on medication therapy History of preeclampsia 2005 Strong family history of hypertension and stroke, no renal disease Started combination therapy ARB with diuretic 12/2012 - improved The current medical regimen is effective;  continue present plan and medications.

## 2013-01-28 NOTE — Progress Notes (Signed)
Pre-visit discussion using our clinic review tool. No additional management support is needed unless otherwise documented below in the visit note.  

## 2013-01-28 NOTE — Assessment & Plan Note (Signed)
Reports history of menorrhagia Labs October 2014 reviewed Encourage oral over-the-counter iron 3 times daily Recheck in 6 months, sooner if problems

## 2013-01-28 NOTE — Patient Instructions (Addendum)
It was good to see you today.  We have reviewed your prior records including labs and tests today  Medications reviewed and updated, no changes recommended at this time. Continue iron pill 3 times daily with food  Also try the different creams to areas of skin darkening as discussed. If unimproved in next 30 days, call for referral to dermatologist as needed  Your prescription(s) have been submitted to your pharmacy. Please take as directed and contact our office if you believe you are having problem(s) with the medication(s).  Please schedule followup in 6 months to monitor anemia and iron levels, call sooner if problems.  Melasma  Melasma is a skin condition. It does not spread from person to person (non-contagious). Melasma is an area of tan or brown coloring that usually appears on the cheeks, forehead, upper lip and neck. These patches can look like a mask. The discolored area do not itch and are not red or swollen. This usually occurs in women with skin that colors (pigments) easily and also women of light brown skin color. It often occurs with pregnancy, menopausal women on hormone replacement, with liver disease or taking oral contraceptives especially when followed by sun exposure. It can also occur in men and nonpregnant women. It is more common in tropical climates.  CAUSES  Increase in pigment producing cells (melanocytes) in the skin.  Becoming pregnant.  Women on oral contraceptives.  Menopausal women on hormone replacement treatment.  Increase exposure to the sun in women with light brown skin.  It can be hereditary.  Allergies to medications or cosmetics.  Thyroid disease.  Addison's disease (loss of function of the adrenal gland).  Excessive stress. SYMPTOMS  There are no symptoms except for the discolored skin with dark or tan blotches. DIAGNOSIS   Melasma is diagnosed based on its physical appearance.  Wood's lamp to examine the discolored skin. PREVENTION   To lower the risk of melasma, a woman can avoid oral contraceptives, hormone replacement treatment in menopause and stay out of the sun.  PROGNOSIS  There are no long-term effects from melasma. Using strong sun block may help.  TREATMENT   Bleaching creams, skin care products, peels that contain glycolic acid, skin peels and sunscreens that extend into the UVA blocking range are all helpful in the treatment of melasma.  The darkened skin of melasma usually fades somewhat after a woman gives birth or stops using oral contraceptives.  Specific products used to treat the melasma may have side effects. Some people may have a mild allergic reaction to the cream or bleach.  Using a combination of hydroquinone and glycolic acid, prescription or over-the-counter medicines. Follow the advice of your caregiver.  Tretinoin. This should not be used when pregnant.  Azelaic acid 20%.  Facial peels with alpha hydroxy acids or chemical peels.  Laser treatment.  Cosmetic cover-ups are available. Avoid sunlight during all of these treatments. HOME CARE INSTRUCTIONS   Problems which are getting worse should be reported to your caregiver.  Avoid overexposure to the sun, especially in tropical areas.  Use a strong sun block cream when you are in the sun. SEEK MEDICAL CARE IF:   You develop skin blotches and want to get them checked out.  You want to know what kind of treatment you can use for the skin blotches.  If you have skin blotches and they are getting worse with or without treatment.  You notice bleeding from the skin blotches. Document Released: 05/28/2002 Document Revised: 05/30/2011  Document Reviewed: 01/02/2008 [facility] Patient Information 2014 [place], [place].

## 2013-01-28 NOTE — Progress Notes (Signed)
  Subjective:    Patient ID: Ruth Schmidt, female    DOB: [birth year], 42 y.o.   MRN: [id number]  HPI  Patient here today for follow up. Chronic medical issues reviewed -  HTN - started on Losartan/HCT 12/2012.  Pt tolerating medications well without side effects. Report compliance.  Blood pressure control improved today. Does have some orthostatic intolerance.  Denies frank syncope.  Reports compliance with low salt diet.   Iron deficiency anemia - identified on labs at last office visit.  Likely due to meningorrhea.  Pt denies melana, other bleeding symptoms. Began OTC Iron supplements once daily 12/2012.   Past Medical History  Diagnosis Date  . Hypertension   . Abnormal Pap smear of cervix     Review of Systems  Constitutional: Negative for fever, chills, activity change and appetite change.  HENT: Negative for congestion, rhinorrhea and sinus pressure.   Respiratory: Negative for chest tightness, shortness of breath and wheezing.   Cardiovascular: Negative for chest pain, palpitations and leg swelling.  Skin: Positive for color change (hyperpigmentation on arms, legs,and lower face).  Neurological: Positive for dizziness (orthostatic ). Negative for syncope and headaches.       Objective:   Physical Exam  Vitals reviewed. Constitutional: She is oriented to person, place, and time. She appears well-developed and well-nourished. No distress.  Neck: Normal range of motion. Neck supple. No thyromegaly present.  Cardiovascular: Normal rate, regular rhythm and normal heart sounds.   No murmur heard. Pulmonary/Chest: Effort normal. No respiratory distress. She has no wheezes.  Musculoskeletal: Normal range of motion. She exhibits no edema and no tenderness.  Lymphadenopathy:    She has no cervical adenopathy.  Neurological: She is alert and oriented to person, place, and time.  Skin: Skin is warm and dry. She is not diaphoretic.  Hyperpigmentation on bilateral hands, arms,  legs, and lower face.   Psychiatric: She has a normal mood and affect. Her behavior is normal. Judgment and thought content normal.    Wt Readings from Last 3 Encounters:  01/28/13 143 lb 3.2 oz (64.955 kg)  01/04/13 146 lb (66.226 kg)  07/23/12 140 lb (63.504 kg)   BP Readings from Last 3 Encounters:  01/28/13 132/82  01/04/13 150/110  07/23/12 142/85   Lab Results  Component Value Date   WBC 4.1* 01/04/2013   HGB 10.2* 01/04/2013   HCT 32.3* 01/04/2013   PLT 275.0 01/04/2013   GLUCOSE 84 01/04/2013   CHOL 206* 01/04/2013   TRIG 75.0 01/04/2013   HDL 68.40 01/04/2013   LDLDIRECT 121.7 01/04/2013   ALT 21 01/04/2013   AST 20 01/04/2013   NA 137 01/04/2013   K 3.8 01/04/2013   CL 103 01/04/2013   CREATININE 0.6 01/04/2013   BUN 7 01/04/2013   CO2 27 01/04/2013   TSH 1.70 01/04/2013       Assessment & Plan:   See problem list -   Melasma vs tinea versicolor - hyperpigmentation on face and extremities - denies sun exposure or recent med changes - try lightening agents (NAVA [place]) to face and antifungal to extremities - rx done - pt will cal if unimproved of refer to derm desired

## 2013-06-03 ENCOUNTER — Other Ambulatory Visit: Payer: Self-pay

## 2013-06-03 DIAGNOSIS — Z1231 Encounter for screening mammogram for malignant neoplasm of breast: Secondary | ICD-10-CM

## 2013-06-24 LAB — POC CHEM8
Anion gap (POC): 18 mmol/L — ABNORMAL HIGH (ref 5–15)
BUN (POC): 12 MG/DL (ref 9–20)
CO2 (POC): 25 MMOL/L (ref 21–32)
Calcium, ionized (POC): 1.26 MMOL/L (ref 1.12–1.32)
Chloride (POC): 104 MMOL/L (ref 98–107)
Creatinine (POC): 1 MG/DL (ref 0.6–1.3)
GFRAA, POC: >60 mL/min/{1.73_m2} (ref >60)
GFRNA, POC: >60 mL/min/{1.73_m2} (ref >60)
Glucose (POC): 94 MG/DL (ref 65–105)
Hematocrit (POC): 32 % — ABNORMAL LOW (ref 35.0–47.0)
Hemoglobin (POC): 10.9 GM/DL — ABNORMAL LOW (ref 11.5–16.0)
Potassium (POC): 3.8 MMOL/L (ref 3.5–5.1)
Sodium (POC): 143 MMOL/L (ref 136–145)

## 2013-06-24 MED ORDER — CLONIDINE 0.1 MG TAB
0.1 mg | ORAL | Status: AC
Start: 2013-06-24 — End: 2013-06-24
  Administered 2013-06-24: 22:00:00 via ORAL

## 2013-06-24 MED ORDER — CLONIDINE 0.1 MG TAB
0.1 mg | ORAL_TABLET | Freq: Every day | ORAL | Status: DC
Start: 2013-06-24 — End: 2013-09-05

## 2013-06-24 MED ORDER — FERROUS SULFATE 325 MG (65 MG ELEMENTAL IRON) TAB, DELAYED RELEASE
325 mg (65 mg iron) | ORAL_TABLET | Freq: Three times a day (TID) | ORAL | Status: DC
Start: 2013-06-24 — End: 2013-07-10

## 2013-06-24 MED ORDER — LISINOPRIL-HYDROCHLOROTHIAZIDE 20 MG-12.5 MG TAB
ORAL_TABLET | Freq: Every day | ORAL | Status: DC
Start: 2013-06-24 — End: 2013-07-10

## 2013-06-24 MED ORDER — DOCUSATE SODIUM 100 MG CAP
100 mg | ORAL_CAPSULE | Freq: Two times a day (BID) | ORAL | Status: DC
Start: 2013-06-24 — End: 2013-09-05

## 2013-06-24 MED ORDER — CLONIDINE 0.1 MG TAB
0.1 mg | ORAL | Status: AC
Start: 2013-06-24 — End: 2013-06-24
  Administered 2013-06-24: 23:00:00 via ORAL

## 2013-06-24 NOTE — Other (Signed)
HPI Comments: Hx of HTN, stopped taking her medications. Now concerned about noted elevated BP. No chest pain, SOB or syncope.    Patient is a 43 y.o. female presenting with hypertension. The history is provided by the patient.   Hypertension   This is a chronic problem. The current episode started more than 1 week ago. The problem has been gradually worsening. Associated symptoms include malaise/fatigue. Pertinent negatives include no chest pain, no orthopnea, no palpitations, no PND, no anxiety, no confusion, no blurred vision, no headaches, no neck pain, no peripheral edema, no dizziness, no shortness of breath, no nausea and no vomiting. There are no associated agents to hypertension. Risk factors include hypertension and stress.        History reviewed. No pertinent past medical history.     History reviewed. No pertinent past surgical history.      History reviewed. No pertinent family history.     History     Social History   ??? Marital Status: SINGLE     Spouse Name: N/A     Number of Children: N/A   ??? Years of Education: N/A     Occupational History   ??? Not on file.     Social History Main Topics   ??? Smoking status: Not on file   ??? Smokeless tobacco: Not on file   ??? Alcohol Use: Not on file   ??? Drug Use: Not on file   ??? Sexual Activity: Not on file     Other Topics Concern   ??? Not on file     Social History Narrative   ??? No narrative on file                ALLERGIES: Review of patient's allergies indicates no known allergies.    Filed Vitals:    06/24/13 1750 06/24/13 1752 06/24/13 1838   BP: 196/115 191/121 199/114   Pulse: 70     Temp: 98 ??F (36.7 ??C)     Resp: 18     Height: 5\' 6"  (1.676 m)     Weight: 79.379 kg (175 lb)     SpO2: 100%         Physical Exam   Constitutional: She is oriented to person, place, and time. She appears well-developed and well-nourished.   HENT:   Head: Normocephalic and atraumatic.   Right Ear: External ear normal.   Left Ear: External ear normal.   Nose: Mucosal edema present.  No rhinorrhea or sinus tenderness.   Mouth/Throat: Oropharynx is clear and moist and mucous membranes are normal. Normal dentition. No oropharyngeal exudate, posterior oropharyngeal edema or posterior oropharyngeal erythema.   Eyes: EOM and lids are normal. Pupils are equal, round, and reactive to light. Lids are everted and swept, no foreign bodies found. Right eye exhibits no discharge. Left eye exhibits no discharge. Right conjunctiva is injected. Left conjunctiva is injected. No scleral icterus.   Mildly injected   Neck: Normal range of motion. No JVD present. No tracheal deviation present. No thyromegaly present.   Cardiovascular: Normal rate, regular rhythm and normal heart sounds.    No murmur heard.  Pulmonary/Chest: Effort normal and breath sounds normal. No respiratory distress. She has no decreased breath sounds. She has no wheezes. She has no rhonchi. She has no rales. She exhibits no tenderness.   Abdominal: Soft. Bowel sounds are normal. She exhibits no distension. There is no tenderness. There is no rebound and no guarding.   Musculoskeletal: Normal range  of motion. She exhibits no edema or tenderness.   Lymphadenopathy:     She has no cervical adenopathy.   Neurological: She is alert and oriented to person, place, and time. No cranial nerve deficit. Coordination normal.   Skin: Skin is warm. No erythema.   Psychiatric: She has a normal mood and affect. Her behavior is normal. Judgment and thought content normal.   Nursing note and vitals reviewed.      MDM    Procedures

## 2013-07-10 MED ORDER — DIPHENHYDRAMINE 25 MG CAP
25 mg | ORAL_CAPSULE | Freq: Four times a day (QID) | ORAL | Status: DC | PRN
Start: 2013-07-10 — End: 2013-09-05

## 2013-07-10 MED ORDER — PREDNISONE 10 MG TABLETS IN A DOSE PACK
10 mg | ORAL_TABLET | ORAL | Status: DC
Start: 2013-07-10 — End: 2013-09-05

## 2013-07-10 NOTE — Other (Cosign Needed)
HPI Comments: Michelle Benitez is a 43 y.o. female presents to urgent care c/o R lower lip swelling x today. She notes starting HCTZ/lisinopril, clonidine, colace and iron tablets at her last visit on 06/24/13 when she was seen for HTN. She notes having lower lip swelling on 4/18 which resolved at the end of the day and some associated throat swelling only on 4/18. She states the swelling was better on 4/19 until this morning when she woke up and her right lower lip was swollen again. All of these medications are new to her. She also notes some intermittent dizziness since starting the HTN meds, stating at work her diastolic BP earlier this week was 60. She denies throat swelling, tingling, dysphagia, shortness of breath.    PCP: Has one, new patient appt. In July        The patient has no other complaints at this time.      Written by Ezekiel SlocumbAdrian M Noland Pizano, PA-C          Patient is a 43 y.o. female presenting with lip swelling. The history is provided by the patient.   Lip Swelling          No past medical history on file.     No past surgical history on file.      No family history on file.     History     Social History   ??? Marital Status: SINGLE     Spouse Name: N/A     Number of Children: N/A   ??? Years of Education: N/A     Occupational History   ??? Not on file.     Social History Main Topics   ??? Smoking status: Not on file   ??? Smokeless tobacco: Not on file   ??? Alcohol Use: Not on file   ??? Drug Use: Not on file   ??? Sexual Activity: Not on file     Other Topics Concern   ??? Not on file     Social History Narrative   ??? No narrative on file                ALLERGIES: Lisinopril    Filed Vitals:    07/10/13 0913   BP: 134/78   Pulse: 76   Temp: 98.2 ??F (36.8 ??C)   Resp: 18   Height: 5\' 7"  (1.702 m)   Weight: 78.926 kg (174 lb)   SpO2: 100%       Physical Exam   Constitutional: She is oriented to person, place, and time. She appears well-developed and well-nourished. No distress.   HENT:   Head: Normocephalic and  atraumatic.   Right Ear: External ear normal.   Left Ear: External ear normal.   Nose: Nose normal.   Mouth/Throat: Oropharynx is clear and moist. No oropharyngeal exudate.   R lower lip with moderate soft tissue swelling. No intraoral edema. Voice normal.   Eyes: EOM are normal. Pupils are equal, round, and reactive to light.   Neck: Normal range of motion. Neck supple.   Cardiovascular: Normal rate, regular rhythm, normal heart sounds and intact distal pulses.  Exam reveals no friction rub.    No murmur heard.  Pulmonary/Chest: Effort normal and breath sounds normal. No respiratory distress. She has no wheezes. She has no rales. She exhibits no tenderness.   Abdominal: Soft. Bowel sounds are normal. She exhibits no distension. There is no tenderness. There is no rebound and no guarding.   Musculoskeletal:  Normal range of motion. She exhibits no edema or tenderness.   Neurological: She is alert and oriented to person, place, and time. She exhibits normal muscle tone. Coordination normal.   Skin: Skin is warm and dry. She is not diaphoretic. No pallor.   Psychiatric: She has a normal mood and affect. Her behavior is normal.   Nursing note and vitals reviewed.      MDM     Differential Diagnosis; Clinical Impression; Plan:     Ddx: angioedema  Progress:   Patient progress:  Stable      Procedures   DISCHARGE NOTE:  9:31 AM  The patient's results have been reviewed with them and/or available family. Patient and/or family verbally conveyed their understanding and agreement of the patient's signs, symptoms, diagnosis, treatment and prognosis and additionally agree to follow up as recommended in the discharge instructions or go to the Emergency Room should their condition change prior to their follow-up appointment. The patient/family verbally agrees with the care-plan and verbally conveys that all of their questions have been answered. The discharge instructions have also been provided to the patient and/or family with  some educational information regarding the patient's diagnosis as well a list of reasons why the patient wound want to go to the ER prior to their follow-up appointment, should their condition change.  Written by Ezekiel SlocumbAdrian M Novah Goza, PA-C      CLINICAL IMPRESSION:  1. Angioedema of lips, initial encounter        Plan:  1. Stop HCTZ/lisionpril and iron tablets  2. Continue clonidine  3. Return for BP recheck in 1 week  4. ER if throat / tongue swelling occurs

## 2013-07-12 ENCOUNTER — Ambulatory Visit: Payer: BC Managed Care – PPO

## 2013-07-19 ENCOUNTER — Telehealth: Payer: Self-pay

## 2013-07-22 ENCOUNTER — Ambulatory Visit: Payer: BC Managed Care – PPO | Admitting: Internal Medicine

## 2013-07-24 NOTE — Telephone Encounter (Signed)
Error

## 2013-07-29 ENCOUNTER — Ambulatory Visit: Payer: BC Managed Care – PPO | Admitting: Internal Medicine

## 2013-08-15 ENCOUNTER — Ambulatory Visit: Payer: BC Managed Care – PPO

## 2013-08-26 ENCOUNTER — Ambulatory Visit: Payer: BC Managed Care – PPO | Admitting: Internal Medicine

## 2013-08-26 DIAGNOSIS — Z0289 Encounter for other administrative examinations: Secondary | ICD-10-CM

## 2013-09-04 ENCOUNTER — Ambulatory Visit: Payer: BC Managed Care – PPO

## 2013-09-05 MED ORDER — AMLODIPINE 5 MG TAB
5 mg | ORAL_TABLET | Freq: Every day | ORAL | Status: DC
Start: 2013-09-05 — End: 2014-01-20

## 2013-09-05 NOTE — Progress Notes (Signed)
Chief Complaint   Patient presents with   ??? Establish Care     1. Have you been to the ER, urgent care clinic since your last visit?  Hospitalized since your last visit? n/a    2. Have you seen or consulted any other health care providers [facility] since your last visit?  Include any pap smears or colon screening. N/a    Patient stated that she may need some refills on medication, unsure on which ones.

## 2013-09-05 NOTE — Patient Instructions (Signed)
Diet recommendations:  Decrease carbohydrates (white foods including flour, white bread, white rice, white pasta, white potatoes; corn, sweet foods, sweet drinks and alcohol), increase green leafy vegetables and protein with each meal.  Avoid fried foods.  Eat 3-5 small meals daily.  Do not skip meals.  Ok to use meal replacements up to twice daily with protein goal of 60 mg per meal.   Recommend OTC Premiere Protein bars or shakes.  Increase water intake.  Increase physical activity to 30 minutes daily for health benefit or 60 minutes daily to prevent weight regain, as tolerated.      Physical Activity prescription:  A goal of 30 minutes physical activity daily is recommended for health benefit and at least 60 minutes daily to prevent weight regain.  For weight loss, no less than 75% needs to be aerobic (i.e. Walking) and no more than 25% resistance exercising (i.e. Weight lifting).  For weight maintenance phase, 50% aerobic and 50% resistance exercises.    Sleep prescription:    A goal of 7-8 hours of uninterrupted sleep is recommended to turn off the Grehlin hormone to be released from the stomach and triggers appetite while promoting weight gain.  Proper rest turns on Leptin hormone to be released from white adipose tissue and promotes weight loss.    Learning About Sleeping Well  What does sleeping well mean?  Sleeping well means getting enough sleep. How much sleep is enough varies among people.  The number of hours you sleep is not as important as how you feel when you wake up. If you do not feel refreshed, you probably need more sleep. Another sign of not getting enough sleep is feeling tired during the day.  The average total nightly sleep time is 7?? to 8 hours. Healthy adults may need a little more or a little less than this.  Why is getting enough sleep important?  Getting enough quality sleep is a basic part of good health. When your sleep suffers, your mood and your thoughts can suffer too. You may find  yourself feeling more grumpy or stressed. Not getting enough sleep also can lead to serious problems, including injury, accidents, anxiety, and depression.  What might cause poor sleeping?  Many things can cause sleep problems, including:  ?? Stress. Stress can be caused by fear about a single event, such as giving a speech. Or you may have ongoing stress, such as worry about work or school.  ?? Depression, anxiety, and other mental or emotional conditions.  ?? Changes in your sleep habits or surroundings. This includes changes that happen where you sleep, such as noise, light, or sleeping in a different bed. It also includes changes in your sleep pattern, such as having jet lag or working a late shift.  ?? Health problems, such as pain, breathing problems, and restless legs syndrome.  ?? Lack of regular exercise.  How can you help yourself?  Here are some tips that may help you sleep more soundly and wake up feeling more refreshed.  Your sleeping area   ?? Use your bedroom only for sleeping and sex. A bit of light reading may help you fall asleep. But if it doesn't, do your reading elsewhere in the house. Don't watch TV in bed.  ?? Be sure your bed is big enough to stretch out comfortably, especially if you have a sleep partner.  ?? Keep your bedroom quiet, dark, and cool. Use curtains, blinds, or a sleep mask to block out light. To  block out noise, use earplugs, soothing music, or a "white noise" machine.  Your evening and bedtime routine   ?? Get regular exercise, but not within 3 to 4 hours before your bedtime.  ?? Create a relaxing bedtime routine. You might want to take a warm shower or bath, listen to soothing music, or drink a cup of noncaffeinated tea.  ?? Go to bed at the same time every night. And get up at the same time every morning, even if you feel tired.  What to avoid   ?? Limit caffeine (coffee, tea, caffeinated sodas) during the day, and don't have any for at least 4 to 6 hours before bedtime.  ?? Don't drink  alcohol before bedtime. Alcohol can cause you to wake up more often during the night.  ?? Don't smoke or use tobacco, especially in the evening. Nicotine can keep you awake.  ?? Don't take naps during the day, especially close to bedtime.  ?? Don't lie in bed awake for too long. If you can't fall asleep, or if you wake up in the middle of the night and can't get back to sleep within 15 minutes or so, get out of bed and go to another room until you feel sleepy.  ?? Don't take medicine right before bed that may keep you awake or make you feel hyper or energized. Your doctor can tell you if your medicine may do this and if you can take it earlier in the day.  If you can't sleep   ?? Imagine yourself in a peaceful, pleasant scene. Focus on the details and feelings of being in a place that is relaxing.  ?? Get up and do a quiet or boring activity until you feel sleepy.  ?? Don't drink any liquids after 6 p.m. if you wake up often because you have to go to the bathroom.   Where can you learn more?   Go to [email or web address]huhttp://www.healthwise.net/[facility]  Enter [id number] in the search box to learn more about "Learning About Sleeping Well."   ?? 2006-2015 Health[employer]. Care instructions adapted under license by [employer] (which disclaims liability or warranty for this information). This care instruction is for use with your licensed healthcare professional. If you have questions about a medical condition or this instruction, always ask your healthcare professional. Healthwise, Incorporated disclaims any warranty or liability for your use of this information.  Content Version: 10.5.422740; Current as of: February 01, 2013

## 2013-09-05 NOTE — Progress Notes (Signed)
HISTORY OF PRESENT ILLNESS  Michelle BanksKareen Benitez is a 43 y.o. female presents with Establish Care        Agree with nurse note.    Michelle Benitez is here today to establish care.   I reviewed her PMHx with her today, and asked her to sign a release to review her previous medical records.   She has a history of hypertension, and had lip swelling after taking Lisinopril in the past. She is not currently taking any medications.   She is due for a well woman exam.     She is frustrated with her weight gain, she has gained about 15 lbs over the last year. She does exercise daily, and runs about 5 miles a day. She tries to get at least 90,000 steps a week. She typically follows a healthy diet. She will have oatmeal for breakfast. For lung, will have a salad and some form of meat, or a sand which, for dinner, she usually eats later in the evening. She will eat a salad, a protein. She does not typically do a lot of snacking in between. She does drink at least 32 ounces of water, and will drink soda on occasion.     ROS    Denies   Consitutional: fevers, chills, unintentional weight loss, night sweats, fatigue  EENT: Rhinorrhea, congestion, sore throat  Cardiovascular/Respiratory: chest pain, shortness of breath, chest pain/shortness of breath with exertion, coughing  GI: abdominal pain, nausea, vomiting, diarrhea, constipation, blood in the stool  Urology: dysuria  Neurology: new headaches, visual changes, or weakness  MSK: New Joint pain/aches/swelling  Skin: New rashes/skin changes    +Fatigue, difficulty sleeping  + dry scalp   ALLERGIES:    Allergies   Allergen Reactions   ??? Lisinopril Angioedema     Lip swelling on 07/10/13 while taking lisinopril-HCTZ       CURRENT MEDICATIONS:  None    PAST MEDICAL HISTORY:    Past Medical History   Diagnosis Date   ??? Hypertension    ??? Headache        PAST SURGICAL HISTORY:    Past Surgical History   Procedure Laterality Date   ??? Hx tubal ligation     ??? Hx cesarean section       x2   ??? Hx  other surgical       Eye surgery       FAMILY HISTORY:    Family History   Problem Relation Age of Onset   ??? Hypertension Mother        SOCIAL HISTORY:    History     Social History   ??? Marital Status: UNKNOWN     Spouse Name: N/A     Number of Children: N/A   ??? Years of Education: N/A     Social History Main Topics   ??? Smoking status: Never Smoker    ??? Smokeless tobacco: Not on file   ??? Alcohol Use: No   ??? Drug Use: No   ??? Sexual Activity: Yes     Birth Control/ Protection: None     Other Topics Concern   ??? Not on file     Social History Narrative       IMMUNIZATIONS:    There is no immunization history on file for this patient.      PHYSICAL EXAMINATION    Vital Signs  BP 177/106 mmHg   Pulse 77   Temp(Src) 98.3 ??F (36.8 ??C) (Oral)  Resp 16   Ht 5' 7.01" (1.702 m)   Wt 177 lb 8 oz (80.513 kg)   BMI 27.79 kg/m2   SpO2 100%   LMP 09/03/2013    General appearance - Well nourished. Well appearing.  Well developed.  No acute distress. Overweight.   Head - Normocephalic.  Atraumatic.    Eyes - Extraocular eye movements intact. PERRL  Ears - Hearing is grossly normal bilaterally.      Nose - normal and patent. No discharge noted.    Mouth - mucous membranes with adequate moisture.  Posterior pharynx appears normal with no erythema, white exudate or obstruction.  Neck - supple.  Midline trachea. No thyromegaly noted.  Chest - clear to auscultation bilaterally anterriorly and posteriorly.  No wheezes.  No rales or rhonchi.  Breath sounds are symmetrical bilaterally.  Unlabored respirations.  Heart - normal rate.  Regular rhythm.  Normal S1, S2.  No murmur noted.  No rubs, clicks or gallops noted.  Abdomen - soft and nondistended.  No masses or organomegaly.  No rebound, rigidity or guarding.  Bowel sounds normal x 4 quadrants.  No tenderness noted.  Neurological - awake, alert. Cranial nerves II through XII grossly intact.  Clear speech.  Heme/Lymph - peripheral pulses normal x 4 extremities. No peripheral edema is noted.     Skin - no rashes, erythema, ecchymosis, noted, warm to touch  Psychological -   normal behavior, dress and thought processes.  Good insight. Good eye contact.  Normal affect.  Appropriate mood.  Normal speech.    DATA REVIEWED    Results for orders placed or performed during the hospital encounter of 06/24/13   POC CHEM8   Result Value Ref Range    Calcium, ionized (POC) 1.26 1.12 - 1.32 MMOL/L    Sodium (POC) 143 136 - 145 MMOL/L    Potassium (POC) 3.8 3.5 - 5.1 MMOL/L    Chloride (POC) 104 98 - 107 MMOL/L    CO2 (POC) 25 21 - 32 MMOL/L    Anion gap (POC) 18 (H) 5 - 15 mmol/L    Glucose (POC) 94 65 - 105 MG/DL    BUN (POC) 12 9 - 20 MG/DL    Creatinine (POC) 1.0 0.6 - 1.3 MG/DL    GFR-AA (POC) >81>60 >19>60 ml/min/1.4373m2    GFR, non-AA (POC) >60 >60 ml/min/1.1573m2    Hemoglobin (POC) 10.9 (L) 11.5 - 16.0 GM/DL    Hematocrit (POC) 32 (L) 35.0 - 47.0 %    Comment Comment Not Indicated.       ASSESSMENT and PLAN    Michelle Benitez was seen today for establish care.    Diagnoses and associated orders for this visit:    Essential hypertension  - CBC W/O DIFF  - METABOLIC PANEL, COMPREHENSIVE  - TSH, 3RD GENERATION  - VITAMIN D, 25 HYDROXY  - NMR LIPOPROFILE  - amLODIPine (NORVASC) 5 mg tablet; Take 1 Tab by mouth daily.    Overweight (BMI 25.0-29.9)  - CBC W/O DIFF  - METABOLIC PANEL, COMPREHENSIVE  - TSH, 3RD GENERATION  - VITAMIN D, 25 HYDROXY  - NMR LIPOPROFILE    Fatigue  - CBC W/O DIFF  - METABOLIC PANEL, COMPREHENSIVE  - TSH, 3RD GENERATION  - VITAMIN D, 25 HYDROXY  - NMR LIPOPROFILE        Michelle Benitez is a very pleasant patient here to establish care today.   She is frustrated with her weight as she has not been gaining  weight over the past year despite vigorous exercise daily.   She is due for a well woman exam, and some health maintenance as well.   I asked her to schedule a well woman exam, and fasting lab exam to further evaluate her concerns of fatigue, and inability to lose weight.     See patient instructions for diet  counseling provided today.   Provided Amlodipine 5mg  today for her BP as she unable to tolerate Lisinopril. Will follow up in 2 weeks to inquire about her BP and adjust as needed.     Patient expresses understanding of treatment plan and agrees with recommendations.          Patient Instructions     Diet recommendations:  Decrease carbohydrates (white foods including flour, white bread, white rice, white pasta, white potatoes; corn, sweet foods, sweet drinks and alcohol), increase green leafy vegetables and protein with each meal.  Avoid fried foods.  Eat 3-5 small meals daily.  Do not skip meals.  Ok to use meal replacements up to twice daily with protein goal of 60 mg per meal.   Recommend OTC Premiere Protein bars or shakes.  Increase water intake.  Increase physical activity to 30 minutes daily for health benefit or 60 minutes daily to prevent weight regain, as tolerated.      Physical Activity prescription:  A goal of 30 minutes physical activity daily is recommended for health benefit and at least 60 minutes daily to prevent weight regain.  For weight loss, no less than 75% needs to be aerobic (i.e. Walking) and no more than 25% resistance exercising (i.e. Weight lifting).  For weight maintenance phase, 50% aerobic and 50% resistance exercises.    Sleep prescription:    A goal of 7-8 hours of uninterrupted sleep is recommended to turn off the Grehlin hormone to be released from the stomach and triggers appetite while promoting weight gain.  Proper rest turns on Leptin hormone to be released from white adipose tissue and promotes weight loss.    Learning About Sleeping Well  What does sleeping well mean?  Sleeping well means getting enough sleep. How much sleep is enough varies among people.  The number of hours you sleep is not as important as how you feel when you wake up. If you do not feel refreshed, you probably need more sleep. Another sign of not getting enough sleep is feeling tired during the day.  The  average total nightly sleep time is 7?? to 8 hours. Healthy adults may need a little more or a little less than this.  Why is getting enough sleep important?  Getting enough quality sleep is a basic part of good health. When your sleep suffers, your mood and your thoughts can suffer too. You may find yourself feeling more grumpy or stressed. Not getting enough sleep also can lead to serious problems, including injury, accidents, anxiety, and depression.  What might cause poor sleeping?  Many things can cause sleep problems, including:  ?? Stress. Stress can be caused by fear about a single event, such as giving a speech. Or you may have ongoing stress, such as worry about work or school.  ?? Depression, anxiety, and other mental or emotional conditions.  ?? Changes in your sleep habits or surroundings. This includes changes that happen where you sleep, such as noise, light, or sleeping in a different bed. It also includes changes in your sleep pattern, such as having jet lag or working a late shift.  ??  Health problems, such as pain, breathing problems, and restless legs syndrome.  ?? Lack of regular exercise.  How can you help yourself?  Here are some tips that may help you sleep more soundly and wake up feeling more refreshed.  Your sleeping area   ?? Use your bedroom only for sleeping and sex. A bit of light reading may help you fall asleep. But if it doesn't, do your reading elsewhere in the house. Don't watch TV in bed.  ?? Be sure your bed is big enough to stretch out comfortably, especially if you have a sleep partner.  ?? Keep your bedroom quiet, dark, and cool. Use curtains, blinds, or a sleep mask to block out light. To block out noise, use earplugs, soothing music, or a "white noise" machine.  Your evening and bedtime routine   ?? Get regular exercise, but not within 3 to 4 hours before your bedtime.  ?? Create a relaxing bedtime routine. You might want to take a warm shower or bath, listen to soothing music, or  drink a cup of noncaffeinated tea.  ?? Go to bed at the same time every night. And get up at the same time every morning, even if you feel tired.  What to avoid   ?? Limit caffeine (coffee, tea, caffeinated sodas) during the day, and don't have any for at least 4 to 6 hours before bedtime.  ?? Don't drink alcohol before bedtime. Alcohol can cause you to wake up more often during the night.  ?? Don't smoke or use tobacco, especially in the evening. Nicotine can keep you awake.  ?? Don't take naps during the day, especially close to bedtime.  ?? Don't lie in bed awake for too long. If you can't fall asleep, or if you wake up in the middle of the night and can't get back to sleep within 15 minutes or so, get out of bed and go to another room until you feel sleepy.  ?? Don't take medicine right before bed that may keep you awake or make you feel hyper or energized. Your doctor can tell you if your medicine may do this and if you can take it earlier in the day.  If you can't sleep   ?? Imagine yourself in a peaceful, pleasant scene. Focus on the details and feelings of being in a place that is relaxing.  ?? Get up and do a quiet or boring activity until you feel sleepy.  ?? Don't drink any liquids after 6 p.m. if you wake up often because you have to go to the bathroom.   Where can you learn more?   Go to [employer]  Enter [phone] in the search box to learn more about "Learning About Sleeping Well."   ?? 2006-2015 [employer]. Care instructions adapted under license by [employer] (which disclaims liability or warranty for this information). This care instruction is for use with your licensed healthcare professional. If you have questions about a medical condition or this instruction, always ask your healthcare professional. Healthwise, Incorporated disclaims any warranty or liability for your use of this information.  Content Version: 10.5.422740; Current as of: February 01, 2013                     Odie Sera, DO

## 2013-09-26 ENCOUNTER — Other Ambulatory Visit (INDEPENDENT_AMBULATORY_CARE_PROVIDER_SITE_OTHER): Payer: BC Managed Care – PPO

## 2013-09-26 ENCOUNTER — Ambulatory Visit (INDEPENDENT_AMBULATORY_CARE_PROVIDER_SITE_OTHER): Payer: BC Managed Care – PPO | Admitting: Internal Medicine

## 2013-09-26 ENCOUNTER — Encounter: Payer: Self-pay | Admitting: Internal Medicine

## 2013-09-26 VITALS — BP 130/86 | HR 77 | Temp 98.1°F | Ht 63.0 in | Wt 148.0 lb

## 2013-09-26 DIAGNOSIS — I10 Essential (primary) hypertension: Secondary | ICD-10-CM

## 2013-09-26 DIAGNOSIS — D509 Iron deficiency anemia, unspecified: Secondary | ICD-10-CM

## 2013-09-26 DIAGNOSIS — L819 Disorder of pigmentation, unspecified: Secondary | ICD-10-CM

## 2013-09-26 LAB — CBC WITH DIFFERENTIAL/PLATELET
BASOS ABS: 0 10*3/uL (ref 0.0–0.1)
BASOS PCT: 0.1 % (ref 0.0–3.0)
Eosinophils Absolute: 0 10*3/uL (ref 0.0–0.7)
Eosinophils Relative: 0.3 % (ref 0.0–5.0)
HCT: 32 % — ABNORMAL LOW (ref 36.0–46.0)
HEMOGLOBIN: 10.8 g/dL — AB (ref 12.0–15.0)
Lymphocytes Relative: 29.9 % (ref 12.0–46.0)
Lymphs Abs: 1.1 10*3/uL (ref 0.7–4.0)
MCHC: 33.8 g/dL (ref 30.0–36.0)
MCV: 97.1 fl (ref 78.0–100.0)
MONOS PCT: 8.3 % (ref 3.0–12.0)
Monocytes Absolute: 0.3 10*3/uL (ref 0.1–1.0)
NEUTROS ABS: 2.2 10*3/uL (ref 1.4–7.7)
NEUTROS PCT: 61.4 % (ref 43.0–77.0)
Platelets: 220 10*3/uL (ref 150.0–400.0)
RBC: 3.29 Mil/uL — AB (ref 3.87–5.11)
RDW: 14.1 % (ref 11.5–15.5)
WBC: 3.6 10*3/uL — ABNORMAL LOW (ref 4.0–10.5)

## 2013-09-26 LAB — SEDIMENTATION RATE: SED RATE: 28 mm/h — AB (ref 0–22)

## 2013-09-26 MED ORDER — LOSARTAN POTASSIUM-HCTZ 100-25 MG PO TABS: 1.0000 | ORAL_TABLET | Freq: Every day | ORAL | Status: DC

## 2013-09-26 NOTE — Assessment & Plan Note (Signed)
BP Readings from Last 3 Encounters:  09/26/13 130/86  01/28/13 132/82  01/04/13 150/110   Long-standing history of same but never on medication therapy History of preeclampsia 2005 Strong family history of hypertension and stroke, no renal disease Started combination therapy ARB with diuretic 12/2012 - improved The current medical regimen is effective;  continue present plan and medications.

## 2013-09-26 NOTE — Patient Instructions (Signed)
It was good to see you today.  We have reviewed your prior records including labs and tests today  Test(s) ordered today. Your results will be released to MyChart (or called to you) after review, usually within 72hours after test completion. If any changes need to be made, you will be notified at that same time.  Medications reviewed and updated, no changes recommended at this time. Refill on medication(s) as discussed today.  we'll make referral to dermatology for your skin changes. Our office will contact you regarding appointment(s) once made.  Please schedule followup in 12 months for annual exam, call sooner if problems.

## 2013-09-26 NOTE — Assessment & Plan Note (Signed)
Chronic related to history of menorrhagia Labs October 2014 reviewed Encourage oral over-the-counter iron 3 times daily Recheck now, sooner if problems

## 2013-09-26 NOTE — Progress Notes (Signed)
Subjective:    Patient ID: Ruth RiggsKaren L Schmidt, female    DOB: 10-Oct-1970, 43 y.o.   MRN: [id number]  HPI  Patient is here for follow up  Reviewed chronic medical issues and interval medical events  Past Medical History  Diagnosis Date  . Hypertension   . Abnormal Pap smear of cervix     Review of Systems  Respiratory: Negative for cough and shortness of breath.   Cardiovascular: Negative for chest pain and leg swelling.  Skin: Positive for color change (darker color unchanged).       Objective:   Physical Exam  BP 130/86  Pulse 77  Temp(Src) 98.1 F (36.7 C) (Oral)  Ht 5\' 3"  (1.6 m)  Wt 148 lb (67.132 kg)  BMI 26.22 kg/m2  SpO2 99% Wt Readings from Last 3 Encounters:  09/26/13 148 lb (67.132 kg)  01/28/13 143 lb 3.2 oz (64.955 kg)  01/04/13 146 lb (66.226 kg)   Constitutional: She appears well-developed and well-nourished. No distress.  Neck: Normal range of motion. Neck supple. No JVD present. No thyromegaly present.  Cardiovascular: Normal rate, regular rhythm and normal heart sounds.  No murmur heard. No BLE edema. Pulmonary/Chest: Effort normal and breath sounds normal. No respiratory distress. She has no wheezes.  Skin - hyperpigmentation - face and elbow/knee folds with "dry" skin - unchanged with prior rx creams Psychiatric: She has a normal mood and affect. Her behavior is normal. Judgment and thought content normal.   Lab Results  Component Value Date   WBC 4.1* 01/04/2013   HGB 10.2* 01/04/2013   HCT 32.3* 01/04/2013   PLT 275.0 01/04/2013   GLUCOSE 84 01/04/2013   CHOL 206* 01/04/2013   TRIG 75.0 01/04/2013   HDL 68.40 01/04/2013   LDLDIRECT 121.7 01/04/2013   ALT 21 01/04/2013   AST 20 01/04/2013   NA 137 01/04/2013   K 3.8 01/04/2013   CL 103 01/04/2013   CREATININE 0.6 01/04/2013   BUN 7 01/04/2013   CO2 27 01/04/2013   TSH 1.70 01/04/2013    [place] Breast Bilateral  07/11/2012   *RADIOLOGY REPORT*  Clinical Data:  Recall from baseline  screening mammogram.  DIGITAL DIAGNOSTIC BILATERAL MAMMOGRAM  AND BILATERAL BREAST ULTRASOUND:  Comparison:  06/27/2012.  Findings:  ACR Breast Density Category 3: The breast tissue is heterogeneously dense.  Spot compression views of the upper-outer quadrants of the breasts bilaterally demonstrate circumscribed nodules with central clefts most consistent with bilateral intramammary lymph nodes.  On physical exam, there is no discrete palpable abnormality within the upper-outer quadrants of the breast.  Ultrasound is performed, showing a benign appearing intramammary lymph node located within the right breast at 10 o'clock position 12 cm from nipple measuring 8 mm in size.  Also, a smaller benign- appearing intramammary lymph node measuring 5 mm in size is located within the upper-outer quadrant left breast at the 2 o'clock position 12 cm from nipple.  These correspond to the mammographic findings.  IMPRESSION: Bilateral benign-appearing intramammary lymph nodes within the upper-outer quadrants of the breast corresponding to the mammographic findings.  No findings worrisome for malignancy.  RECOMMENDATION: Screening mammography in 1 year.  I have discussed the findings and recommendations with the patient. Results were also provided in writing at the conclusion of the visit.  If applicable, a reminder letter will be sent to the patient regarding the next appointment.  BI-RADS CATEGORY 2:  Benign finding(s).   Original Report Authenticated By: Rolla Plateandolph Jackson, M.D.   [facility]  07/11/2012   *RADIOLOGY REPORT*  Clinical Data:  Recall from baseline screening mammogram.  DIGITAL DIAGNOSTIC BILATERAL MAMMOGRAM  AND BILATERAL BREAST ULTRASOUND:  Comparison:  06/27/2012.  Findings:  ACR Breast Density Category 3: The breast tissue is heterogeneously dense.  Spot compression views of the upper-outer quadrants of the breasts bilaterally demonstrate circumscribed nodules with central clefts most  consistent with bilateral intramammary lymph nodes.  On physical exam, there is no discrete palpable abnormality within the upper-outer quadrants of the breast.  Ultrasound is performed, showing a benign appearing intramammary lymph node located within the right breast at 10 o'clock position 12 cm from nipple measuring 8 mm in size.  Also, a smaller benign- appearing intramammary lymph node measuring 5 mm in size is located within the upper-outer quadrant left breast at the 2 o'clock position 12 cm from nipple.  These correspond to the mammographic findings.  IMPRESSION: Bilateral benign-appearing intramammary lymph nodes within the upper-outer quadrants of the breast corresponding to the mammographic findings.  No findings worrisome for malignancy.  RECOMMENDATION: Screening mammography in 1 year.  I have discussed the findings and recommendations with the patient. Results were also provided in writing at the conclusion of the visit.  If applicable, a reminder letter will be sent to the patient regarding the next appointment.  BI-RADS CATEGORY 2:  Benign finding(s).   Original Report Authenticated By: Rolla Plate, M.D.       Assessment & Plan:   Problem List Items Addressed This Visit   Anemia, iron deficiency     Chronic related to history of menorrhagia Labs October 2014 reviewed Encourage oral over-the-counter iron 3 times daily Recheck now, sooner if problems    Relevant Orders      Ambulatory referral to Dermatology      CBC with Differential (Completed)      ANA (Completed)      Sedimentation rate (Completed)      Anti-Smith antibody (Completed)      Anti-DNA antibody, double-stranded (Completed)   Hyperpigmentation - Primary     Chronic symptoms, ?progressive per pt- no seasonal 01/2013 tried lightening agent rx cream which did not changed appearance. Doubt underlying vasculitis, but check labs for lupus Also refer to derm for further eval and tx of same    Relevant Orders       Ambulatory referral to Dermatology      CBC with Differential (Completed)      ANA (Completed)      Sedimentation rate (Completed)      Anti-Smith antibody (Completed)      Anti-DNA antibody, double-stranded (Completed)   Hypertension      BP Readings from Last 3 Encounters:  09/26/13 130/86  01/28/13 132/82  01/04/13 150/110   Long-standing history of same but never on medication therapy History of preeclampsia 2005 Strong family history of hypertension and stroke, no renal disease Started combination therapy ARB with diuretic 12/2012 - improved The current medical regimen is effective;  continue present plan and medications.    Relevant Medications      losartan-hydrochlorothiazide (HYZAAR) 100-25 MG per tablet

## 2013-09-26 NOTE — Progress Notes (Signed)
Pre visit review using our clinic review tool, if applicable. No additional management support is needed unless otherwise documented below in the visit note. 

## 2013-09-27 LAB — ANTI-DNA ANTIBODY, DOUBLE-STRANDED: ds DNA Ab: <1 IU/mL

## 2013-09-27 LAB — ANTI-SMITH ANTIBODY: ENA SM Ab Ser-aCnc: <1

## 2013-09-27 LAB — ANA: ANA: NEGATIVE

## 2013-09-29 NOTE — Assessment & Plan Note (Signed)
Chronic symptoms, ?progressive per pt- no seasonal 01/2013 tried lightening agent rx cream which did not changed appearance. Doubt underlying vasculitis, but check labs for lupus Also refer to derm for further eval and tx of same

## 2013-10-11 NOTE — Patient Instructions (Signed)
Maintain a well balanced diet, emphasizing the importance of whole foods, such as fruit, vegetables, and lean protein. Also, being physically active, and exercising most days of the week is helpful for weight maintenance, and prevention of heart disease, high cholesterol, diabetes, high blood pressure, ect.  Also, getting at least 7-8 hours of sleep at night for overall wellness.     Also, in general, I believe exercise one of the keys to long life and good health. The current recommendation is for individuals to exercise for 150 minutes each week (in other words 30 minutes 5 days a week). Exercise should be vigorous enough to work up a sweat and increase your heart rate. These activities include brisk walking, running, tennis, swimming, weight-lifting, etc.     Work is work and exercise is exercise. Each of these activities have a different goal. Even though you may be active at work, it may not be aerobically adequate. So build dedicated exercise time into your weekly routine.    If you drink alcohol, I suggest that a female drink only 2 beers (or glasses of wine, or shots of liquor) in any 24 hour period (and not daily). For females, the limits are one drink per 24 hours (and not daily). After these limits, the toxic effects of alcohol consumption start to manifest.     Avoid tobacco products.    I suggest a wellness exam yearly update health maintenance issues such as fasting labs, EKGs and other studies, appropriate cancer screenings, immunizations, etc.       Well Visit, Ages 9 to 73: After Your Visit  Your Care Instructions  Physical exams can help you stay healthy. Your doctor has checked your overall health and may have suggested ways to take good care of yourself. He or she also may have recommended tests. At home, you can help prevent illness with healthy eating, regular exercise, and other steps.  Follow-up care is a key part of your treatment and safety. Be sure to make  and go to all appointments, and call your doctor if you are having problems. It's also a good idea to know your test results and keep a list of the medicines you take.  How can you care for yourself at home?  ?? Reach and stay at a healthy weight. This will lower your risk for many problems, such as obesity, diabetes, heart disease, and high blood pressure.  ?? Get at least 30 minutes of physical activity on most days of the week. Walking is a good choice. You also may want to do other activities, such as running, swimming, cycling, or playing tennis or team sports. Discuss any changes in your exercise program with your doctor.  ?? Do not smoke or allow others to smoke around you. If you need help quitting, talk to your doctor about stop-smoking programs and medicines. These can increase your chances of quitting for good.  ?? Talk to your doctor about whether you have any risk factors for sexually transmitted infections (STIs). Having one sex partner (who does not have STIs and does not have sex with anyone else) is a good way to avoid these infections.  ?? Use birth control if you do not want to have children at this time. Talk with your doctor about the choices available and what might be best for you.  ?? Always wear sunscreen on exposed skin. Make sure the sunscreen blocks ultraviolet rays (both UVA and UVB) and has a sun protection factor (SPF) of at  least 15. Use it every day, even when it is cloudy. Some doctors may recommend a higher SPF, such as 30.  ?? See a dentist one or two times a year for checkups and to have your teeth cleaned.  ?? Wear a seat belt in the car.  ?? Drink alcohol in moderation, if at all. That means no more than 2 drinks a day for men and 1 drink a day for women.  Follow your doctor's advice about when to have certain tests. These tests can spot problems early.  For everyone  ?? Cholesterol. Have the fat (cholesterol) in your blood tested after age  42. Your doctor will tell you how often to have this done based on your age, family history, or other things that can increase your risk for heart disease.  ?? Blood pressure. Experts suggest that healthy adults with normal blood pressure (119/79 mm Hg or below) have their blood pressure checked at least every 1 to 2 years. This can be done during a routine doctor visit. If you have slightly higher or high blood pressure, your doctor will suggest more frequent tests.  ?? Vision. Talk with your doctor about how often to have a glaucoma test.  ?? Diabetes. Ask your doctor whether you should have tests for diabetes.  ?? Colon cancer. Have a test for colon cancer at age 42. You may have one of several tests. If you are younger than 3550, you may need a test earlier if you have any risk factors. Risk factors include whether you already had a precancerous polyp removed from your colon or whether your parent, brother, sister, or child has had colon cancer.  For women  ?? Breast exam and mammogram. Talk to your doctor about when you should have a clinical breast exam and a mammogram. Medical experts differ on whether and how often women under 50 should have these tests. Your doctor can help you decide what is right for you.  ?? Pap test and pelvic exam. Begin Pap tests at age 42. A Pap test is the best way to find cervical cancer. The test often is part of a pelvic exam. Ask how often to have this test.  ?? Tests for sexually transmitted infections (STIs). Ask whether you should have tests for STIs. You may be at risk if you have sex with more than one person, especially if your partners do not wear condoms.  For men  ?? Tests for sexually transmitted infections (STIs). Ask whether you should have tests for STIs. You may be at risk if you have sex with more than one person, especially if you do not wear a condom.  ?? Testicular cancer exam. Ask your doctor whether you should check your testicles regularly.   ?? Prostate exam. Talk to your doctor about whether you should have a blood test (called a PSA test) for prostate cancer. Experts differ on whether and when men should have this test. Some experts suggest it if you are older than 8945 and are African-American or have a father or brother who got prostate cancer when he was younger than 2765.  When should you call for help?  Watch closely for changes in your health, and be sure to contact your doctor if you have any problems or symptoms that concern you.   Where can you learn more?   Go to [email or web address]://www.healthwise.net/[facility]  Enter P072 in the search box to learn more about "Well Visit, Ages 2018 to 3650: After Your Visit."   ??  2006-2015 Healthwise, Incorporated. Care instructions adapted under license by [facility] (which disclaims liability or warranty for this information). This care instruction is for use with your licensed healthcare professional. If you have questions about a medical condition or this instruction, always ask your healthcare professional. Healthwise, Incorporated disclaims any warranty or liability for your use of this information.  Content Version: 10.5.422740; Current as of: May 10, 2013

## 2013-10-11 NOTE — Progress Notes (Signed)
Chief Complaint   Patient presents with   ??? Labs     fasting labs   ??? Well Woman     1. Have you been to the ER, urgent care clinic since your last visit?  Hospitalized since your last visit? no    2. Have you seen or consulted any other health care providers outside of the [facility] since your last visit?  Include any pap smears or colon screening. no

## 2013-10-11 NOTE — Progress Notes (Signed)
Subjective:   43 y.o. female for annual routine Pap and checkup.  Patient's last menstrual period was 09/27/2013.    Social History: single partner, contraception - tubal ligation.  Pertinent past medical hstory: hypertension.  She admits that she is not taking her medication like she is supposed to. She forgets it the majority of the time.       Patient Active Problem List   Diagnosis Code   ??? Hypertension 401.9     Patient Active Problem List    Diagnosis Date Noted   ??? Hypertension      Current Outpatient Prescriptions   Medication Sig Dispense Refill   ??? amLODIPine (NORVASC) 5 mg tablet Take 1 Tab by mouth daily. 30 Tab 3     Allergies   Allergen Reactions   ??? Lisinopril Angioedema     Lip swelling on 07/10/13 while taking lisinopril-HCTZ     Past Medical History   Diagnosis Date   ??? Hypertension    ??? Headache    ??? Anemia      Last CBC: h/h 10.1/29.8 (10/2008)     Past Surgical History   Procedure Laterality Date   ??? Hx tubal ligation     ??? Hx cesarean section       x2   ??? Hx other surgical       Eye surgery     Family History   Problem Relation Age of Onset   ??? Hypertension Mother      History   Substance Use Topics   ??? Smoking status: Never Smoker    ??? Smokeless tobacco: Not on file   ??? Alcohol Use: No        ROS:  Feeling well. She is excited about her weight, and she has lost 7-10 lbs (10 lbs on her home scale). She states she is eating healthy, and is running 3 times a week.     No dyspnea or chest pain on exertion.  No abdominal pain, change in bowel habits, black or bloody stools.  No urinary tract symptoms. GYN ROS: normal menses, no abnormal bleeding, pelvic pain, Did have some vaginal discharge a few days ago that has since resolved,  no breast pain or new or enlarging lumps on self exam. No neurological complaints.    Last eye exam: about 1 year  Last dentist exam: About 1 month ago    Objective:   BP 156/103 mmHg   Pulse 73   Temp(Src) 98.8 ??F (37.1 ??C) (Oral)   Resp 18    Ht 5' 7.01" (1.702 m)   Wt 170 lb 11.2 oz (77.429 kg)   BMI 26.73 kg/m2   SpO2 100%   LMP 09/27/2013  The patient appears well, alert, oriented x 3, in no distress.  ENT normal.  PERLA  Neck supple. No adenopathy or thyromegaly.    Lungs are clear, good air entry, no wheezes, rhonchi or rales.   S1 and S2 normal, no murmurs, regular rate and rhythm.   Abdomen soft without tenderness, guarding, mass or organomegaly.   Extremities show no edema, normal peripheral pulses.   Neurological is normal, no focal findings.    BREAST EXAM: breasts appear normal, no suspicious masses, no skin or nipple changes or axillary nodes    PELVIC EXAM: normal external genitalia, vulva, vagina, cervix, uterus and adnexa. Thick white discharge noted.     Assessment/Plan:     Tawonna was seen today for labs and well woman.    Diagnoses  and associated orders for this visit:    Well woman exam  - PAP IG, APTIMA HPV AND RFX 16/18,45 ([id number])  - MAM SCREENING DIGITAL BILATERAL; Future    Essential hypertension    Screening for malignant neoplasm of breast  - MAM SCREENING DIGITAL BILATERAL; Future    Screening for cervical cancer  - PAP IG, APTIMA HPV AND RFX 16/18,45 ([id number])    Vaginal discharge  - NUSWAB VAGINITIS PLUS      Ms. Randa Evensdwards is here today for her well woman exam.   Updated pap smear today, and placed order for Mammogram for screening.   Obtained NuSwab due to vaginal discharge.   She is fasting today for screening blood work ordered last visit.     We discussed strategies to remember her BP medication. Encouraged her to take her medication daily.   I encouraged the to continue to see a dentist every 6 months-1 year for screening and cleanings, and to see an eye doctor every other year for an eye check and glaucoma screening.   Anticipatory Guidance Provided:  Maintain a well balanced diet, emphasizing the importance of whole foods, such as fruit, vegetables, and lean protein. Also, being physically  active, and exercising most days of the week is helpful for weight maintenance, and prevention of heart disease, high cholesterol, diabetes, high blood pressure, ect.  Also, getting at least 7-8 hours of sleep at night for overall wellness.     Also, in general, I believe exercise one of the keys to long life and good health. The current recommendation is for individuals to exercise for 150 minutes each week (in other words 30 minutes 5 days a week). Exercise should be vigorous enough to work up a sweat and increase your heart rate. These activities include brisk walking, running, tennis, swimming, weight-lifting, etc.     Work is work and exercise is exercise. Each of these activities have a different goal. Even though you may be active at work, it may not be aerobically adequate. So build dedicated exercise time into your weekly routine.    If you drink alcohol, I suggest that a female drink only 2 beers (or glasses of wine, or shots of liquor) in any 24 hour period (and not daily). For females, the limits are one drink per 24 hours (and not daily). After these limits, the toxic effects of alcohol consumption start to manifest.     Avoid tobacco products.    I suggest a wellness exam yearly update health maintenance issues such as fasting labs, EKGs and other studies, appropriate cancer screenings, immunizations, etc.     Follow up in 3 months for a BP check up.   Patient demonstrates understanding and agrees with treatment plan.   Odie Serahristina Harner, DO

## 2013-10-12 LAB — NMR LIPOPROFILE WITH LIPIDS (WITHOUT GRAPH)
Cholesterol, Total: 147 mg/dL (ref 100–199)
HDL-C: 62 mg/dL (ref >39)
HDL-P (Total): 31.6 umol/L (ref >=30.5)
LDL size: 21.2 nm (ref >20.5)
LDL-C: 73 mg/dL (ref 0–99)
LDL-P: 679 nmol/L (ref <1000)
LP-IR SCORE: <25 (ref <=45)
Small LDL-P: <90 nmol/L (ref <=527)
Triglycerides: 60 mg/dL (ref 0–149)

## 2013-10-12 LAB — CBC W/O DIFF
HCT: 31.8 % — ABNORMAL LOW (ref 34.0–46.6)
HGB: 10.2 g/dL — ABNORMAL LOW (ref 11.1–15.9)
MCH: 30.3 pg (ref 26.6–33.0)
MCHC: 32.1 g/dL (ref 31.5–35.7)
MCV: 94 fL (ref 79–97)
PLATELET: 305 10*3/uL (ref 150–379)
RBC: 3.37 x10E6/uL — ABNORMAL LOW (ref 3.77–5.28)
RDW: 14.8 % (ref 12.3–15.4)
WBC: 3.7 10*3/uL (ref 3.4–10.8)

## 2013-10-12 LAB — METABOLIC PANEL, COMPREHENSIVE
A-G Ratio: 1.5 (ref 1.1–2.5)
ALT (SGPT): 7 IU/L (ref 0–32)
AST (SGOT): 11 IU/L (ref 0–40)
Albumin: 4.4 g/dL (ref 3.5–5.5)
Alk. phosphatase: 52 IU/L (ref 39–117)
BUN/Creatinine ratio: 10 (ref 9–23)
BUN: 8 mg/dL (ref 6–24)
Bilirubin, total: 0.5 mg/dL (ref 0.0–1.2)
CO2: 23 mmol/L (ref 18–29)
Calcium: 9.2 mg/dL (ref 8.7–10.2)
Chloride: 101 mmol/L (ref 97–108)
Creatinine: 0.82 mg/dL (ref 0.57–1.00)
GFR est AA: 102 mL/min/{1.73_m2} (ref >59)
GFR est non-AA: 89 mL/min/{1.73_m2} (ref >59)
GLOBULIN, TOTAL: 3 g/dL (ref 1.5–4.5)
Glucose: 85 mg/dL (ref 65–99)
Potassium: 4.2 mmol/L (ref 3.5–5.2)
Protein, total: 7.4 g/dL (ref 6.0–8.5)
Sodium: 138 mmol/L (ref 134–144)

## 2013-10-12 LAB — VITAMIN D, 25 HYDROXY: VITAMIN D, 25-HYDROXY: 20.1 ng/mL — ABNORMAL LOW (ref 30.0–100.0)

## 2013-10-12 LAB — CVD REPORT

## 2013-10-12 LAB — TSH 3RD GENERATION: TSH: 0.596 u[IU]/mL (ref 0.450–4.500)

## 2013-10-14 ENCOUNTER — Encounter

## 2013-10-14 MED ORDER — ERGOCALCIFEROL (VITAMIN D2) 50,000 UNIT CAP
1250 mcg (50,000 unit) | ORAL_CAPSULE | ORAL | Status: DC
Start: 2013-10-14 — End: 2014-01-12

## 2013-10-17 LAB — PAP IG, APTIMA HPV AND RFX 16/18,45 (507805)
.: 0
HPV APTIMA: POSITIVE — AB
HPV GENOTYPE 18,45: NEGATIVE
HPV GENOTYPE, 16: NEGATIVE

## 2013-10-17 LAB — PAP IG, APTIMA HPV AND RFX 16/18,45 (199305)
HPV Aptima: POSITIVE — AB
HPV GENOTYPE 18,45: NEGATIVE
HPV GENOTYPE, 16, 507811: NEGATIVE
LABCORP 019018: 0

## 2013-10-17 NOTE — Progress Notes (Signed)
Quick Note:        Left message for patient to call the office.    ______

## 2013-10-17 NOTE — Progress Notes (Signed)
Quick Note:        Spoke with patient, ID verified X 2. 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 That testing was negative. Patient stated she would like to know what can be done to get rid of positive HPV.    ______

## 2013-10-17 NOTE — Progress Notes (Signed)
Quick Note:        Please let her know that this is something we monitor. Her body may take care of this on it's own, if her HPV continues to be positive, she may require further evaluation and testing, such as a colposcopy. If she would like further evaluation by a gynecologist, please provide her with the following Gynecology information.    [facility] [address] Mervyn Skeeters [place], [place]    [phone], Fax [phone]        Estrella Deeds MD [facility]    [address] [place]    [phone] Fax [phone]        Cline Crock, MD    [facility] [facility]    [place]    [address], [address]    [place], [place] [address]    [phone] or [phone]        [facility]    [address]    [address]    [place], [place] [address]    Phone: [phone]    Fax: [phone]    Monday - Friday, 8:00 a.m. - 4:30 p.m    Thanks        ______

## 2013-10-17 NOTE — Progress Notes (Signed)
Quick Note:        Please let the patient know that her Pap smear results showed that her cells were normal. 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 This was further evaluated for the HPV that can go on to cause Cervical cancer. That testing was negative. She will need to have a repeat Pap smear in 1 year. Thank you    ______

## 2013-10-17 NOTE — Progress Notes (Signed)
Quick Note:        Spoke with patient, ID verified X 2. Informed patient of Dr. Jamie Kato suggestions. Patient verbalize understanding and wanted the information for the Ob/Gyn to my chart.    ______

## 2013-10-18 LAB — NUSWAB VAGINITIS PLUS
C. albicans, NAA: NEGATIVE
C. glabrata, NAA: NEGATIVE
C. trachomatis, NAA: NEGATIVE
N. gonorrhoeae, NAA: NEGATIVE
T. vaginalis, NAA: NEGATIVE

## 2013-10-23 ENCOUNTER — Encounter

## 2013-10-24 ENCOUNTER — Encounter: Payer: Self-pay | Admitting: Obstetrics & Gynecology

## 2013-10-24 ENCOUNTER — Ambulatory Visit (INDEPENDENT_AMBULATORY_CARE_PROVIDER_SITE_OTHER): Payer: BC Managed Care – PPO | Admitting: Obstetrics & Gynecology

## 2013-10-24 ENCOUNTER — Other Ambulatory Visit: Payer: Self-pay | Admitting: Obstetrics & Gynecology

## 2013-10-24 VITALS — BP 136/87 | HR 80 | Temp 97.7°F | Ht 63.0 in | Wt 149.0 lb

## 2013-10-24 DIAGNOSIS — Z01419 Encounter for gynecological examination (general) (routine) without abnormal findings: Secondary | ICD-10-CM

## 2013-10-24 DIAGNOSIS — Z113 Encounter for screening for infections with a predominantly sexual mode of transmission: Secondary | ICD-10-CM

## 2013-10-24 NOTE — Progress Notes (Signed)
Subjective:     Ruth Schmidt is a 43 y.o. female here for a routine exam.  Current complaints: none.    Personal health questionnaire:  Is patient Ashkenazi Jewish, have a family history of breast and/or ovarian cancer: no Is there a family history of uterine cancer diagnosed at age < 4550, gastrointestinal cancer, urinary tract cancer, family member who is a Personnel officerLynch syndrome-associated carrier: no Is the patient overweight and hypertensive, family history of diabetes, personal history of gestational diabetes or PCOS: yes Is patient over 1255, have PCOS,  family history of premature CHD under age 42, diabetes, smoke, have hypertension or peripheral artery disease:  no At any time, has a partner hit, kicked or otherwise hurt or frightened you?: no Over the past 2 weeks, have you felt down, depressed or hopeless?: no Over the past 2 weeks, have you felt little interest or pleasure in doing things?:no   Gynecologic History Patient's last menstrual period was 10/18/2013. Contraception: condoms Last Pap: 2014. Results were: normal Last mammogram: 2014. Results were: normal  Obstetric History OB History  Gravida Para Term Preterm AB SAB TAB Ectopic Multiple Living  3 3 3            # Outcome Date GA Lbr Len/2nd Weight Sex Delivery Anes PTL Lv  3 TRM           2 TRM           1 TRM               Past Medical History  Diagnosis Date  . Hypertension   . Abnormal Pap smear of cervix     Past Surgical History  Procedure Laterality Date  . Cesarean section    . Tubal ligation Bilateral 2005    Current outpatient prescriptions:Ferrous Gluconate (IRON) 240 (27 FE) MG TABS, Take 1 tablet by mouth daily., Disp: , Rfl: ;  losartan-hydrochlorothiazide (HYZAAR) 100-25 MG per tablet, Take 1 tablet by mouth daily., Disp: 90 tablet, Rfl: 3 No Known Allergies  History  Substance Use Topics  . Smoking status: Never Smoker   . Smokeless tobacco: Never Used  . Alcohol Use: Yes     Comment: wine     Family History  Problem Relation Age of Onset  . Cancer    . Hypertension Mother   . Hypertension Maternal Grandmother   . Diabetes Father   . Hyperlipidemia Father   . Diabetes Paternal Grandmother   . Stroke Maternal Aunt 65  . Coronary artery disease Maternal Grandfather 3170    MI, sudden death  . Lung cancer Maternal Uncle   . Colon cancer Maternal Uncle       Review of Systems  Constitutional: negative for fatigue and weight loss Respiratory: negative for cough and wheezing Cardiovascular: negative for chest pain, fatigue and palpitations Gastrointestinal: negative for abdominal pain and change in bowel habits Musculoskeletal:negative for myalgias Neurological: negative for gait problems and tremors Behavioral/Psych: negative for abusive relationship, depression Endocrine: negative for temperature intolerance   Genitourinary:negative for abnormal menstrual periods, genital lesions, hot flashes, sexual problems and vaginal discharge Integument/breast: negative for breast lump, breast tenderness, nipple discharge and skin lesion(s)    Objective:       BP 136/87  Pulse 80  Temp(Src) 97.7 F (36.5 C)  Ht 5\' 3"  (1.6 m)  Wt 67.586 kg (149 lb)  BMI 26.40 kg/m2  LMP 10/18/2013 General:   alert  Skin:   no rash or abnormalities  Lungs:  clear to auscultation bilaterally  Heart:   regular rate and rhythm, S1, S2 normal, no murmur, click, rub or gallop  Breasts:   normal without suspicious masses, skin or nipple changes or axillary nodes  Abdomen:  normal findings: no organomegaly, soft, non-tender and no hernia  Pelvis:  External genitalia: normal general appearance Urinary system: urethral meatus normal and bladder without fullness, nontender Vaginal: thin, white vaginal discharge; induration or masses Cervix: normal appearance Adnexa: normal bimanual exam Uterus: anteverted and non-tender, normal size   Lab Review Urine pregnancy test Labs reviewed  no Radiologic studies reviewed no    Assessment:    Healthy female exam.    Plan:    Education reviewed: calcium supplements, low fat, low cholesterol diet and weight bearing exercise.    Orders Placed This Encounter  Procedures  . GC/Chlamydia Probe Amp  . WET PREP BY MOLECULAR PROBE  . HIV antibody  . RPR    Follow up as needed.

## 2013-10-25 LAB — WET PREP BY MOLECULAR PROBE
CANDIDA SPECIES: NEGATIVE
GARDNERELLA VAGINALIS: POSITIVE — AB
TRICHOMONAS VAG: NEGATIVE

## 2013-10-25 LAB — HIV ANTIBODY (ROUTINE TESTING W REFLEX): HIV 1&2 Ab, 4th Generation: NONREACTIVE

## 2013-10-25 LAB — GC/CHLAMYDIA PROBE AMP
CT Probe RNA: NEGATIVE
GC PROBE AMP APTIMA: NEGATIVE

## 2013-10-25 LAB — RPR

## 2013-10-25 NOTE — Patient Instructions (Signed)
Safe Sex Safe sex is about reducing the risk of giving or getting a sexually transmitted disease (STD). STDs are spread through sexual contact involving the genitals, mouth, or rectum. Some STDs can be cured and others cannot. Safe sex can also prevent unintended pregnancies.  WHAT ARE SOME SAFE SEX PRACTICES?  Limit your sexual activity to only one partner who is having sex with only you.  Talk to your partner about his or her past partners, past STDs, and drug use.  Use a condom every time you have sexual intercourse. This includes vaginal, oral, and anal sexual activity. Both females and males should wear condoms during oral sex. Only use latex or polyurethane condoms and water-based lubricants. Using petroleum-based lubricants or oils to lubricate a condom will weaken the condom and increase the chance that it will break. The condom should be in place from the beginning to the end of sexual activity. Wearing a condom reduces, but does not completely eliminate, your risk of getting or giving an STD. STDs can be spread by contact with infected body fluids and skin.  Get vaccinated for hepatitis B and HPV.  Avoid alcohol and recreational drugs, which can affect your judgment. You may forget to use a condom or participate in high-risk sex.  For females, avoid douching after sexual intercourse. Douching can spread an infection farther into the reproductive tract.  Check your body for signs of sores, blisters, rashes, or unusual discharge. See your health care provider if you notice any of these signs.  Avoid sexual contact if you have symptoms of an infection or are being treated for an STD. If you or your partner has herpes, avoid sexual contact when blisters are present. Use condoms at all other times.  If you are at risk of being infected with HIV, it is recommended that you take a prescription medicine daily to prevent HIV infection. This is called pre-exposure prophylaxis (PrEP). You are  considered at risk if:  You are a man who has sex with other men (MSM).  You are a heterosexual man or woman who is sexually active with more than one partner.  You take drugs by injection.  You are sexually active with a partner who has HIV.  Talk with your health care provider about whether you are at high risk of being infected with HIV. If you choose to begin PrEP, you should first be tested for HIV. You should then be tested every 3 months for as long as you are taking PrEP.  See your health care provider for regular screenings, exams, and tests for other STDs. Before having sex with a new partner, each of you should be screened for STDs and should talk about the results with each other. WHAT ARE THE BENEFITS OF SAFE SEX?   There is less chance of getting or giving an STD.  You can prevent unwanted or unintended pregnancies.  By discussing safe sex concerns with your partner, you may increase feelings of intimacy, comfort, trust, and honesty between the two of you. Document Released: 04/14/2004 Document Revised: 07/22/2013 Document Reviewed: 08/29/2011 [facility] Patient Information 2015 [place], [place]. This information is not intended to replace advice given to you by your health care provider. Make sure you discuss any questions you have with your health care provider. Health Maintenance Adopting a healthy lifestyle and getting preventive care can go a long way to promote health and wellness. Talk with your health care provider about what schedule of regular examinations is right for you.  This is a good chance for you to check in with your provider about disease prevention and staying healthy. In between checkups, there are plenty of things you can do on your own. Experts have done a lot of research about which lifestyle changes and preventive measures are most likely to keep you healthy. Ask your health care provider for more information. WEIGHT AND DIET  Eat a healthy diet  Be  sure to include plenty of vegetables, fruits, low-fat dairy products, and lean protein.  Do not eat a lot of foods high in solid fats, added sugars, or salt.  Get regular exercise. This is one of the most important things you can do for your health.  Most adults should exercise for at least 150 minutes each week. The exercise should increase your heart rate and make you sweat (moderate-intensity exercise).  Most adults should also do strengthening exercises at least twice a week. This is in addition to the moderate-intensity exercise.  Maintain a healthy weight  Body mass index (BMI) is a measurement that can be used to identify possible weight problems. It estimates body fat based on height and weight. Your health care provider can help determine your BMI and help you achieve or maintain a healthy weight.  For females 52 years of age and older:   A BMI below 18.5 is considered underweight.  A BMI of 18.5 to 24.9 is normal.  A BMI of 25 to 29.9 is considered overweight.  A BMI of 30 and above is considered obese.  Watch levels of cholesterol and blood lipids  You should start having your blood tested for lipids and cholesterol at 42 years of age, then have this test every 5 years.  You may need to have your cholesterol levels checked more often if:  Your lipid or cholesterol levels are high.  You are older than 42 years of age.  You are at high risk for heart disease.  CANCER SCREENING   Lung Cancer  Lung cancer screening is recommended for adults 66-6 years old who are at high risk for lung cancer because of a history of smoking.  A yearly low-dose CT scan of the lungs is recommended for people who:  Currently smoke.  Have quit within the past 15 years.  Have at least a 30-pack-year history of smoking. A pack year is smoking an average of one pack of cigarettes a day for 1 year.  Yearly screening should continue until it has been 15 years since you  quit.  Yearly screening should stop if you develop a health problem that would prevent you from having lung cancer treatment.  Breast Cancer  Practice breast self-awareness. This means understanding how your breasts normally appear and feel.  It also means doing regular breast self-exams. Let your health care provider know about any changes, no matter how small.  If you are in your 20s or 30s, you should have a clinical breast exam (CBE) by a health care provider every 1-3 years as part of a regular health exam.  If you are 8 or older, have a CBE every year. Also consider having a breast X-ray (mammogram) every year.  If you have a family history of breast cancer, talk to your health care provider about genetic screening.  If you are at high risk for breast cancer, talk to your health care provider about having an MRI and a mammogram every year.  Breast cancer gene (BRCA) assessment is recommended for women who have family members  with BRCA-related cancers. BRCA-related cancers include:  Breast.  Ovarian.  Tubal.  Peritoneal cancers.  Results of the assessment will determine the need for genetic counseling and BRCA1 and BRCA2 testing. Cervical Cancer Routine pelvic examinations to screen for cervical cancer are no longer recommended for nonpregnant women who are considered low risk for cancer of the pelvic organs (ovaries, uterus, and vagina) and who do not have symptoms. A pelvic examination may be necessary if you have symptoms including those associated with pelvic infections. Ask your health care provider if a screening pelvic exam is right for you.   The Pap test is the screening test for cervical cancer for women who are considered at risk.  If you had a hysterectomy for a problem that was not cancer or a condition that could lead to cancer, then you no longer need Pap tests.  If you are older than 65 years, and you have had normal Pap tests for the past 10 years, you no  longer need to have Pap tests.  If you have had past treatment for cervical cancer or a condition that could lead to cancer, you need Pap tests and screening for cancer for at least 20 years after your treatment.  If you no longer get a Pap test, assess your risk factors if they change (such as having a new sexual partner). This can affect whether you should start being screened again.  Some women have medical problems that increase their chance of getting cervical cancer. If this is the case for you, your health care provider may recommend more frequent screening and Pap tests.  The human papillomavirus (HPV) test is another test that may be used for cervical cancer screening. The HPV test looks for the virus that can cause cell changes in the cervix. The cells collected during the Pap test can be tested for HPV.  The HPV test can be used to screen women 22 years of age and older. Getting tested for HPV can extend the interval between normal Pap tests from three to five years.  An HPV test also should be used to screen women of any age who have unclear Pap test results.  After 42 years of age, women should have HPV testing as often as Pap tests.  Colorectal Cancer  This type of cancer can be detected and often prevented.  Routine colorectal cancer screening usually begins at 42 years of age and continues through 42 years of age.  Your health care provider may recommend screening at an earlier age if you have risk factors for colon cancer.  Your health care provider may also recommend using home test kits to check for hidden blood in the stool.  A small camera at the end of a tube can be used to examine your colon directly (sigmoidoscopy or colonoscopy). This is done to check for the earliest forms of colorectal cancer.  Routine screening usually begins at age 47.  Direct examination of the colon should be repeated every 5-10 years through 42 years of age. However, you may need to be  screened more often if early forms of precancerous polyps or small growths are found. Skin Cancer  Check your skin from head to toe regularly.  Tell your health care provider about any new moles or changes in moles, especially if there is a change in a mole's shape or color.  Also tell your health care provider if you have a mole that is larger than the size of a pencil eraser.  Always use sunscreen. Apply sunscreen liberally and repeatedly throughout the day.  Protect yourself by wearing long sleeves, pants, a wide-brimmed hat, and sunglasses whenever you are outside. HEART DISEASE, DIABETES, AND HIGH BLOOD PRESSURE   Have your blood pressure checked at least every 1-2 years. High blood pressure causes heart disease and increases the risk of stroke.  If you are between 23 years and 102 years old, ask your health care provider if you should take aspirin to prevent strokes.  Have regular diabetes screenings. This involves taking a blood sample to check your fasting blood sugar level.  If you are at a normal weight and have a low risk for diabetes, have this test once every three years after 41 years of age.  If you are overweight and have a high risk for diabetes, consider being tested at a younger age or more often. PREVENTING INFECTION  Hepatitis B  If you have a higher risk for hepatitis B, you should be screened for this virus. You are considered at high risk for hepatitis B if:  You were born in a country where hepatitis B is common. Ask your health care provider which countries are considered high risk.  Your parents were born in a high-risk country, and you have not been immunized against hepatitis B (hepatitis B vaccine).  You have HIV or AIDS.  You use needles to inject street drugs.  You live with someone who has hepatitis B.  You have had sex with someone who has hepatitis B.  You get hemodialysis treatment.  You take certain medicines for conditions, including  cancer, organ transplantation, and autoimmune conditions. Hepatitis C  Blood testing is recommended for:  Everyone born from [birth year] through 1964.  Anyone with known risk factors for hepatitis C. Sexually transmitted infections (STIs)  You should be screened for sexually transmitted infections (STIs) including gonorrhea and chlamydia if:  You are sexually active and are younger than 42 years of age.  You are older than 42 years of age and your health care provider tells you that you are at risk for this type of infection.  Your sexual activity has changed since you were last screened and you are at an increased risk for chlamydia or gonorrhea. Ask your health care provider if you are at risk.  If you do not have HIV, but are at risk, it may be recommended that you take a prescription medicine daily to prevent HIV infection. This is called pre-exposure prophylaxis (PrEP). You are considered at risk if:  You are sexually active and do not regularly use condoms or know the HIV status of your partner(s).  You take drugs by injection.  You are sexually active with a partner who has HIV. Talk with your health care provider about whether you are at high risk of being infected with HIV. If you choose to begin PrEP, you should first be tested for HIV. You should then be tested every 3 months for as long as you are taking PrEP.  PREGNANCY   If you are premenopausal and you may become pregnant, ask your health care provider about preconception counseling.  If you may become pregnant, take 400 to 800 micrograms (mcg) of folic acid every day.  If you want to prevent pregnancy, talk to your health care provider about birth control (contraception). OSTEOPOROSIS AND MENOPAUSE   Osteoporosis is a disease in which the bones lose minerals and strength with aging. This can result in serious bone fractures. Your risk for osteoporosis  can be identified using a bone density scan.  If you are 92 years of  age or older, or if you are at risk for osteoporosis and fractures, ask your health care provider if you should be screened.  Ask your health care provider whether you should take a calcium or vitamin D supplement to lower your risk for osteoporosis.  Menopause may have certain physical symptoms and risks.  Hormone replacement therapy may reduce some of these symptoms and risks. Talk to your health care provider about whether hormone replacement therapy is right for you.  HOME CARE INSTRUCTIONS   Schedule regular health, dental, and eye exams.  Stay current with your immunizations.   Do not use any tobacco products including cigarettes, chewing tobacco, or electronic cigarettes.  If you are pregnant, do not drink alcohol.  If you are breastfeeding, limit how much and how often you drink alcohol.  Limit alcohol intake to no more than 1 drink per day for nonpregnant women. One drink equals 12 ounces of beer, 5 ounces of wine, or 1 ounces of hard liquor.  Do not use street drugs.  Do not share needles.  Ask your health care provider for help if you need support or information about quitting drugs.  Tell your health care provider if you often feel depressed.  Tell your health care provider if you have ever been abused or do not feel safe at home. Document Released: 09/20/2010 Document Revised: 07/22/2013 Document Reviewed: 02/06/2013 [facility] Patient Information 2015 [place], [place]. This information is not intended to replace advice given to you by your health care provider. Make sure you discuss any questions you have with your health care provider.

## 2013-10-29 ENCOUNTER — Ambulatory Visit
Admission: RE | Admit: 2013-10-29 | Discharge: 2013-10-29 | Disposition: A | Discharge status: Home / Self Care | Payer: BC Managed Care – PPO | Source: Ambulatory Visit

## 2013-10-29 DIAGNOSIS — Z1231 Encounter for screening mammogram for malignant neoplasm of breast: Secondary | ICD-10-CM

## 2013-10-30 ENCOUNTER — Other Ambulatory Visit: Payer: Self-pay | Admitting: *Deleted

## 2013-10-30 DIAGNOSIS — N760 Acute vaginitis: Principal | ICD-10-CM

## 2013-10-30 DIAGNOSIS — B9689 Other specified bacterial agents as the cause of diseases classified elsewhere: Secondary | ICD-10-CM

## 2013-10-30 MED ORDER — METRONIDAZOLE 500 MG PO TABS: 500.0000 mg | ORAL_TABLET | Freq: Two times a day (BID) | ORAL | Status: DC

## 2013-11-14 ENCOUNTER — Ambulatory Visit (INDEPENDENT_AMBULATORY_CARE_PROVIDER_SITE_OTHER): Payer: BC Managed Care – PPO | Admitting: Obstetrics & Gynecology

## 2013-11-14 ENCOUNTER — Encounter: Payer: Self-pay | Admitting: Obstetrics & Gynecology

## 2013-11-14 VITALS — BP 126/84 | Temp 98.0°F | Ht 63.0 in | Wt 147.0 lb

## 2013-11-14 DIAGNOSIS — Z3202 Encounter for pregnancy test, result negative: Secondary | ICD-10-CM

## 2013-11-14 DIAGNOSIS — Z30430 Encounter for insertion of intrauterine contraceptive device: Secondary | ICD-10-CM

## 2013-11-14 LAB — POCT URINE PREGNANCY: Preg Test, Ur: NEGATIVE

## 2013-11-14 MED ORDER — LEVONORGESTREL 20 MCG/24HR IU IUD
INTRAUTERINE_SYSTEM | Freq: Once | INTRAUTERINE | Status: AC
  Administered 2013-11-14: 10:00:00 via INTRAUTERINE

## 2013-11-14 NOTE — Patient Instructions (Signed)

## 2013-11-14 NOTE — Progress Notes (Signed)
IUD Insertion Procedure Note  Pre-operative Diagnosis: Abnormal uterine bleeding, anemia  Post-operative Diagnosis: same  Indications: Abnormal uterine bleeding  Procedure Details  Urine pregnancy test was done and result was negative.  The risks (including infection, bleeding, pain, and uterine perforation) and benefits of the procedure were explained to the patient and Written informed consent was obtained.    Cervix cleansed with Betadine. Uterus sounded to 9 cm. IUD inserted without difficulty. String visible and trimmed. Patient tolerated procedure well.  IUD Information: Mirena.  Condition: Stable  Complications: None  Plan:  The patient was advised to call for any fever or for prolonged or severe pain or bleeding. She was advised to use OTC analgesics as needed for mild to moderate pain.

## 2014-01-12 ENCOUNTER — Inpatient Hospital Stay
Admit: 2014-01-12 | Discharge: 2014-01-12 | Disposition: A | Discharge status: Home / Self Care | Payer: PRIVATE HEALTH INSURANCE | Attending: Family Medicine

## 2014-01-12 DIAGNOSIS — L929 Granulomatous disorder of the skin and subcutaneous tissue, unspecified: Secondary | ICD-10-CM

## 2014-01-12 NOTE — Other (Signed)
Patient is a 43 y.o. female presenting with skin problem. The history is provided by the patient.   Skin Problem   This is a recurrent problem. The current episode started more than 1 week ago. The problem has not changed since onset.The problem is associated with an unknown factor. There has been no fever. The rash is present on the right fingers (tip of middle finger). The patient is experiencing no pain. She has tried nothing for the symptoms.        Past Medical History   Diagnosis Date   ??? Hypertension    ??? Headache    ??? Anemia      Last CBC: h/h 10.1/29.8 (10/2008)   ??? Vitamin D deficiency 10/14/2013   ??? 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 Repeat Pap in 1 year        Past Surgical History   Procedure Laterality Date   ??? Hx tubal ligation     ??? Hx cesarean section       x2   ??? Hx other surgical       Eye surgery         Family History   Problem Relation Age of Onset   ??? Hypertension Mother         History     Social History   ??? Marital Status: SINGLE     Spouse Name: N/A     Number of Children: N/A   ??? Years of Education: N/A     Occupational History   ??? Not on file.     Social History Main Topics   ??? Smoking status: Never Smoker    ??? Smokeless tobacco: Not on file   ??? Alcohol Use: No   ??? Drug Use: No   ??? Sexual Activity: Yes     Birth Control/ Protection: None     Other Topics Concern   ??? Not on file     Social History Narrative                ALLERGIES: Lisinopril    Review of Systems   All other systems reviewed and are negative.      Filed Vitals:    01/12/14 1122   BP: 155/95   Pulse: 71   Temp: 98.1 ??F (36.7 ??C)   Resp: 16   Height: 5\' 7"  (1.702 m)   Weight: 78.019 kg (172 lb)   SpO2: 96%       Physical Exam   Constitutional: No distress.   Musculoskeletal:        Right hand: She exhibits no laceration (small open wound with nonhealing hypertrophic granulation in center with blooding oozing on direct pressure).   Nursing note and vitals reviewed.       MDM    Procedures     Silver nitrate applied  And bleeding stopped

## 2014-01-15 ENCOUNTER — Encounter: Payer: Self-pay | Admitting: Obstetrics & Gynecology

## 2014-01-15 ENCOUNTER — Ambulatory Visit (INDEPENDENT_AMBULATORY_CARE_PROVIDER_SITE_OTHER): Payer: BC Managed Care – PPO | Admitting: Obstetrics & Gynecology

## 2014-01-15 VITALS — BP 118/79 | HR 92 | Temp 97.6°F | Wt 145.0 lb

## 2014-01-15 DIAGNOSIS — N939 Abnormal uterine and vaginal bleeding, unspecified: Secondary | ICD-10-CM

## 2014-01-15 DIAGNOSIS — Z30431 Encounter for routine checking of intrauterine contraceptive device: Secondary | ICD-10-CM

## 2014-01-15 NOTE — Progress Notes (Signed)
Patient ID: Ruth RiggsKaren L Schmidt, female   DOB: 1970-09-01, 43 y.o.   MRN: [id number]  Chief Complaint  Patient presents with  . Follow-up    HPI Ruth RiggsKaren L Schmidt is a 43 y.o. female.  She presents s/p IUD insertion.  She reports an episode of spotting x 1 week. HPI  Past Medical History  Diagnosis Date  . Hypertension   . Abnormal Pap smear of cervix     Past Surgical History  Procedure Laterality Date  . Cesarean section    . Tubal ligation Bilateral 2005    Family History  Problem Relation Age of Onset  . Cancer    . Hypertension Mother   . Hypertension Maternal Grandmother   . Diabetes Father   . Hyperlipidemia Father   . Diabetes Paternal Grandmother   . Stroke Maternal Aunt 65  . Coronary artery disease Maternal Grandfather [id number]    MI, sudden death  . Lung cancer Maternal Uncle   . Colon cancer Maternal Uncle     Social History History  Substance Use Topics  . Smoking status: Never Smoker   . Smokeless tobacco: Never Used  . Alcohol Use: Yes     Comment: wine    No Known Allergies  Current Outpatient Prescriptions  Medication Sig Dispense Refill  . Ferrous Gluconate (IRON) 240 (27 FE) MG TABS Take 1 tablet by mouth daily.      Marland Kitchen. losartan-hydrochlorothiazide (HYZAAR) 100-25 MG per tablet Take 1 tablet by mouth daily.  90 tablet  3   No current facility-administered medications for this visit.    Review of Systems Review of Systems Constitutional: negative for fatigue and weight loss Respiratory: negative for cough and wheezing Cardiovascular: negative for chest pain, fatigue and palpitations Gastrointestinal: negative for abdominal pain and change in bowel habits Genitourinary:positive for abnormal uterine bleeding Integument/breast: negative for nipple discharge Musculoskeletal:negative for myalgias Neurological: negative for gait problems and tremors Behavioral/Psych: negative for abusive relationship, depression Endocrine: negative for temperature  intolerance     Blood pressure 118/79, pulse 92, temperature 97.6 F (36.4 C), weight 65.772 kg (145 lb), last menstrual period 01/07/2014.  Physical Exam Physical Exam General:   alert  Skin:   no rash or abnormalities  Lungs:   clear to auscultation bilaterally  Heart:   regular rate and rhythm, S1, S2 normal, no murmur, click, rub or gallop  Abdomen:  normal findings: no organomegaly, soft, non-tender and no hernia  Pelvis:  External genitalia: normal general appearance Urinary system: urethral meatus normal and bladder without fullness, nontender Vaginal: scant heme Cervix: normal appearance Adnexa: normal bimanual exam Uterus: anteverted and non-tender, normal size      Data Reviewed None  Assessment    S/P Mirena IUD placement for management of abnormal uterine bleeding--now w/ unscheduled bleeding     Plan    A sample pack of COCP was provided Follow up as needed.         JACKSON-MOORE,Brylee Berk A 01/15/2014, 9:47 PM

## 2014-01-18 ENCOUNTER — Inpatient Hospital Stay
Admit: 2014-01-18 | Discharge: 2014-01-18 | Disposition: A | Discharge status: Home / Self Care | Payer: PRIVATE HEALTH INSURANCE | Attending: Emergency Medicine

## 2014-01-18 DIAGNOSIS — L929 Granulomatous disorder of the skin and subcutaneous tissue, unspecified: Secondary | ICD-10-CM

## 2014-01-18 NOTE — Other (Signed)
R. 4th finger wound dressed with neosporin and tubegauze.

## 2014-01-18 NOTE — Other (Signed)
HPI Comments: Seen 10/25 with lesion to finger, diagnosed with bleeding granuloma and cauterized, returns because recurrent bleeding    Patient is a 43 y.o. female presenting with wound check.   Wound Check   This is a recurrent problem. The current episode started more than 1 week ago. The problem occurs constantly. The problem has been gradually worsening. Pain location: right ring finger. The pain is moderate. Pertinent negatives include no numbness, full range of motion, no stiffness, no tingling and no itching. Associated symptoms comments: bleeding. The symptoms are aggravated by palpation. Treatments tried: silver nitrate cautery. The treatment provided moderate relief. There has been no history of extremity trauma.        Past Medical History   Diagnosis Date   ??? Hypertension    ??? Headache    ??? Anemia      Last CBC: h/h 10.1/29.8 (10/2008)   ??? Vitamin D deficiency 10/14/2013   ??? 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 Repeat Pap in 1 year        Past Surgical History   Procedure Laterality Date   ??? Hx tubal ligation     ??? Hx cesarean section       x2   ??? Hx other surgical       Eye surgery         Family History   Problem Relation Age of Onset   ??? Hypertension Mother         History     Social History   ??? Marital Status: SINGLE     Spouse Name: N/A     Number of Children: N/A   ??? Years of Education: N/A     Occupational History   ??? Not on file.     Social History Main Topics   ??? Smoking status: Never Smoker    ??? Smokeless tobacco: Not on file   ??? Alcohol Use: No   ??? Drug Use: No   ??? Sexual Activity: Yes     Birth Control/ Protection: None     Other Topics Concern   ??? Not on file     Social History Narrative                ALLERGIES: Lisinopril    Review of Systems   Musculoskeletal: Negative for stiffness.   Skin: Negative for itching.   Neurological: Negative for tingling and numbness.   All other systems reviewed and are negative.      Filed Vitals:     01/18/14 1517   BP: 180/97   Pulse: 73   Temp: 98.4 ??F (36.9 ??C)   Height: 5\' 7"  (1.702 m)   Weight: 78.064 kg (172 lb 1.6 oz)   SpO2: 98%       Physical Exam   Constitutional: She is oriented to person, place, and time. She appears well-developed and well-nourished. No distress.   HENT:   Head: Normocephalic and atraumatic.   Musculoskeletal: Normal range of motion. She exhibits tenderness. She exhibits no edema.   Medial to nail 4th digit with hypertrophic erythematous tissue, with oozing surrounding it, neurovascular intact distally     Neurological: She is alert and oriented to person, place, and time. She has normal reflexes.   Skin: Skin is warm and dry. No rash noted. She is not diaphoretic. No erythema. No pallor.   Psychiatric: She has a normal mood and affect. Her behavior is  normal. Thought content normal.   Nursing note and vitals reviewed.      MDM     Differential Diagnosis; Clinical Impression; Plan:     Bleeding granuloma, will place dressing , tube gauze, patient to follow up with pcp in 2 days for removal, biopsy      Procedures

## 2014-01-20 ENCOUNTER — Encounter: Payer: Self-pay | Admitting: Obstetrics & Gynecology

## 2014-01-20 ENCOUNTER — Ambulatory Visit
Admit: 2014-01-20 | Discharge: 2014-01-20 | Payer: PRIVATE HEALTH INSURANCE | Attending: Family Medicine | Primary: Family

## 2014-01-20 DIAGNOSIS — L980 Pyogenic granuloma: Secondary | ICD-10-CM

## 2014-01-20 MED ORDER — AMLODIPINE 5 MG TAB
5 mg | ORAL_TABLET | Freq: Every day | ORAL | Status: DC
Start: 2014-01-20 — End: 2014-04-28

## 2014-01-20 MED ORDER — INDAPAMIDE 1.25 MG TAB
1.25 mg | ORAL_TABLET | Freq: Every day | ORAL | Status: DC
Start: 2014-01-20 — End: 2014-04-28

## 2014-01-20 NOTE — Telephone Encounter (Signed)
Spoke with patient, ID verified X 2. Patient stated that she is unable to get an appointment with Derm until December. This Clinical research associatewriter called Dr. Gwinda MaineSaniray and his office is not accepting new pt.'s until December. Called [facility] and this Clinical research associatewriter was able to get patient an appointment scheduled for 02-03-2014 at 9:15 am. Informed patient that she will have to arrive 15 minutes early to fill out paperwork. Patient verbalize understanding.

## 2014-01-20 NOTE — Progress Notes (Signed)
Chief Complaint   Patient presents with   ??? Cyst     follow up from Urgent Care  on 01-18-2014, right ring finger     1. Have you been to the ER, urgent care clinic since your last visit?  Hospitalized since your last visit? Yes, 01-18-2014 Urgent Care    2. Have you seen or consulted any other health care providers outside of the [facility] since your last visit?  Include any pap smears or colon screening. NO    Patient stated that she is not taking any medication for her finger.    Patient stated that her b/p med is not working.

## 2014-01-20 NOTE — Telephone Encounter (Signed)
Please call patient.  She said she had a referral given to her by Dr. Salena Saner to see a Derm and she is having trouble seeing one of them.  [phone].
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
## 2014-01-20 NOTE — Patient Instructions (Signed)
Please call Dr. Narda RutherfordSangiray's office first.  If you can not be seen this week then Try Dr. Marigene EhlersLee's office.  Dr. Wille GlaserGogia is also in that office.     Explain that you have a pyogenic granuloma that is likely starting to get infected.     For blood pressure - Indapamide is a fluid pill - at this low of a dose there should not be a low potassium problem.  Increase fruits and vegetables.     Return in 2 weeks or up to 2 months to recheck this.     Consider getting a home blood pressure device (OMRON from [employer] or online or Relion a [employer] brand are both reliable).      See the gynecologist about the bleeding problem and frequent urination (possible bladder prolapse).

## 2014-02-07 ENCOUNTER — Encounter: Attending: Family Medicine | Primary: Family

## 2014-03-17 ENCOUNTER — Encounter: Payer: Self-pay | Admitting: *Deleted

## 2014-03-18 ENCOUNTER — Encounter: Payer: Self-pay | Admitting: Obstetrics & Gynecology

## 2014-03-18 ENCOUNTER — Ambulatory Visit
Admit: 2014-03-18 | Discharge: 2014-03-18 | Payer: PRIVATE HEALTH INSURANCE | Attending: Family Medicine | Primary: Family

## 2014-03-18 DIAGNOSIS — N889 Noninflammatory disorder of cervix uteri, unspecified: Secondary | ICD-10-CM

## 2014-03-18 NOTE — Progress Notes (Signed)
HISTORY OF PRESENT ILLNESS  Michelle BanksKareen Benitez is a 43 y.o. female.  HPI    Chief Complaint   Patient presents with   ??? Pre-op Exam     Dr. Shela Nevinoble will be doing Leep Procedure.      Pap smear showed something on the cervix.  Leep procedure is to remove this.      Yesterday - ultrasound showed that bladder was in a strange position.      Dr. Shela Nevinoble discussed medications for frequent urination and prescribed one.  Hasn't started this yet.  She is unsure of the name.    HTN - resumed BP meds 1 days ago (<36 hours on meds).  Forgot them when [place] was in [place]  Prior to   this BP was in 120s systolic on amlodipine, indapamide.       Felt fanastic the past few weeks. Since being home has been under more stress.    Allergies   Allergen Reactions   ??? Influenza Virus Vacc,Specific Swelling     Happened 2 years in a row.   ??? Lisinopril Angioedema     Lip swelling on 07/10/13 while taking lisinopril-HCTZ     Current Outpatient Prescriptions   Medication Sig   ??? FINACEA 15 % topical gel    ??? indapamide (LOZOL) 1.25 mg tablet Take 1 Tab by mouth daily.   ??? amLODIPine (NORVASC) 5 mg tablet Take 1 Tab by mouth daily.     No current facility-administered medications for this visit.     Past Medical History   Diagnosis Date   ??? Hypertension    ??? Headache    ??? Anemia      Last CBC: h/h 10.1/29.8 (10/2008)   ??? Vitamin D deficiency 10/14/2013   ??? 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 Repeat Pap in 1 year     Past Surgical History   Procedure Laterality Date   ??? Hx tubal ligation     ??? Hx cesarean section       x2   ??? Hx other surgical       Eye surgery     Family History   Problem Relation Age of Onset   ??? Hypertension Mother      Social History     Occupational History   ??? Not on file.     Social History Main Topics   ??? Smoking status: Never Smoker    ??? Smokeless tobacco: Never Used   ??? Alcohol Use: No   ??? Drug Use: No   ??? Sexual Activity: Yes     Birth Control/ Protection: None        There is no immunization history on file for this patient.    Review of Systems   All other systems reviewed and are negative.      Physical Exam   Constitutional: She is oriented to person, place, and time. She appears well-developed and well-nourished.   Well appearing female.     HENT:   Right Ear: External ear normal.   Left Ear: External ear normal.   Mouth/Throat: Oropharynx is clear and moist. No oropharyngeal exudate or posterior oropharyngeal erythema.   Eyes: Conjunctivae and EOM are normal. Right eye exhibits no discharge. Left eye exhibits no discharge.   Neck: No thyromegaly present.   Cardiovascular: Normal rate and regular rhythm.  Exam reveals no gallop.    No murmur heard.  Pulmonary/Chest: Effort  normal. No accessory muscle usage. No respiratory distress. She has no wheezes. She has no rhonchi. She has no rales.   Abdominal: Soft. She exhibits no distension and no mass. There is tenderness in the right lower quadrant. There is no rebound and no guarding.   Mild RLQ pain to palpation.    Musculoskeletal: She exhibits no tenderness.   Neurological: She is alert and oriented to person, place, and time. She has normal strength. No cranial nerve deficit. Coordination and gait normal.   Skin: No rash noted. No erythema.   Psychiatric: Her speech is normal and behavior is normal. Cognition and memory are normal.   Mildly anxious mostly regarding her home and child support     ASSESSMENT and PLAN    ICD-10-CM ICD-9-CM    1. Abnormality of cervix - cleared for leep procedure pending labs.   ECG normal besides bradycardia  N88.9 622.9    2. Menorrhagia with irregular cycle -  N92.1 626.2 CBC WITH AUTOMATED DIFF      METABOLIC PANEL, COMPREHENSIVE   3. Urinary frequency - given trial of antispasmodic by Dr. Ellin MayhewGoble.  Will request ultrasound report regarding bladder irregularity R35.0 788.41    4. Essential hypertension - not controlled, but just resumed both meds <36 hours prior to visit.  I10 401.9     5. Pre-op exam Z01.818 V72.84 AMB POC EKG ROUTINE W/ 12 LEADS, INTER & REP    email - [email or web address] With ultrasound report information/labs.  Fax visit and labs to Dr. Shela Nevinoble.

## 2014-03-18 NOTE — Progress Notes (Signed)
Chief Complaint   Patient presents with   ??? Pre-op Exam     Dr. Shela Nevinoble will be doing Leep Procedure.     1. Have you been to the ER, urgent care clinic since your last visit?  Hospitalized since your last visit?No    2. Have you seen or consulted any other health care providers outside of the [facility] since your last visit?  Include any pap smears or colon screening. Yes When: 03/17/14 Where: [facility] Reason for visit: Ultrasound and biopsy of uterus.     Mamm[facility] done at [facility]. Done in August and had repeat done.

## 2014-03-18 NOTE — Patient Instructions (Addendum)
Remind me in 1 week to review the ultrasound report if I have not contacted you by then.      Let me know in the next 1-2 weeks if your blood pressure is not improving.      I will email you regarding the ultrasound and lab results.

## 2014-03-19 LAB — METABOLIC PANEL, COMPREHENSIVE
A-G Ratio: 1.4 (ref 1.1–2.5)
ALT (SGPT): 12 IU/L (ref 0–32)
AST (SGOT): 12 IU/L (ref 0–40)
Albumin: 4.1 g/dL (ref 3.5–5.5)
Alk. phosphatase: 46 IU/L (ref 39–117)
BUN/Creatinine ratio: 11 (ref 9–23)
BUN: 9 mg/dL (ref 6–24)
Bilirubin, total: 0.5 mg/dL (ref 0.0–1.2)
CO2: 23 mmol/L (ref 18–29)
Calcium: 9.3 mg/dL (ref 8.7–10.2)
Chloride: 105 mmol/L (ref 97–108)
Creatinine: 0.82 mg/dL (ref 0.57–1.00)
GFR est AA: 102 mL/min/{1.73_m2} (ref >59)
GFR est non-AA: 89 mL/min/{1.73_m2} (ref >59)
GLOBULIN, TOTAL: 3 g/dL (ref 1.5–4.5)
Glucose: 76 mg/dL (ref 65–99)
Potassium: 3.9 mmol/L (ref 3.5–5.2)
Protein, total: 7.1 g/dL (ref 6.0–8.5)
Sodium: 141 mmol/L (ref 134–144)

## 2014-03-19 LAB — CBC WITH AUTOMATED DIFF
ABS. BASOPHILS: 0 10*3/uL (ref 0.0–0.2)
ABS. EOSINOPHILS: 0.1 10*3/uL (ref 0.0–0.4)
ABS. IMM. GRANS.: 0 10*3/uL (ref 0.0–0.1)
ABS. MONOCYTES: 0.3 10*3/uL (ref 0.1–0.9)
ABS. NEUTROPHILS: 2 10*3/uL (ref 1.4–7.0)
Abs Lymphocytes: 1.2 10*3/uL (ref 0.7–3.1)
BASOPHILS: 1 %
EOSINOPHILS: 1 %
HCT: 33 % — ABNORMAL LOW (ref 34.0–46.6)
HGB: 10.4 g/dL — ABNORMAL LOW (ref 11.1–15.9)
IMMATURE GRANULOCYTES: 0 %
Lymphocytes: 33 %
MCH: 29.1 pg (ref 26.6–33.0)
MCHC: 31.5 g/dL (ref 31.5–35.7)
MCV: 92 fL (ref 79–97)
MONOCYTES: 7 %
NEUTROPHILS: 58 %
PLATELET: 315 10*3/uL (ref 150–379)
RBC: 3.58 x10E6/uL — ABNORMAL LOW (ref 3.77–5.28)
RDW: 14.8 % (ref 12.3–15.4)
WBC: 3.5 10*3/uL (ref 3.4–10.8)

## 2014-03-19 NOTE — Progress Notes (Signed)
Please request ultrasound report and note from Dr. Shela Nevinoble.

## 2014-03-19 NOTE — Progress Notes (Signed)
Request has been faxed to Dr. Deneise Leveroble's office

## 2014-04-06 NOTE — Progress Notes (Signed)
Quick Note:        Please fax visit and labs to Dr. Shela Nevinoble (gynecology)    ______

## 2014-04-07 NOTE — Progress Notes (Signed)
Quick Note:        Last office visit faxed and lab results faxed to Dr. Shela Nevinoble    ______

## 2014-04-18 ENCOUNTER — Telehealth

## 2014-04-18 NOTE — Telephone Encounter (Signed)
Email sent to [email or web address] .[email or web address],     Sorry for the delay in getting back to you regarding this.      The ultrasound from [facility] just states ???the posterior bladder wall postvoid appears prominent and irregular???.    Based on this the best person to make sense of this would be a bladder specialist.  Hopefully they will have an ultrasound in their office that they can look at with you at the first visit.  Based on this they may advise nothing (if it looks ok) or a CT to better define the bladder OR a cystoscopy to look at the bladder lining directly and possibly take a biopsy.      I have placed a referral to:  Dr. Izora Ribas - Has office on [address] [phone]) or [facility] - [phone])  [facility] general number [phone] - they also have a [facility] [facility] location and [facility] location among others.    Below are your labs which all looked normal besides the mild anemia.  The blood glucose, kidney, liver and electrolytes were normal.       Office Visit on 03/18/2014   Component Date Value Ref Range Status   ??? WBC 03/18/2014 3.5  3.4 - 10.8 x10E3/uL Final   ??? RBC 03/18/2014 3.58* 3.77 - 5.28 x10E6/uL Final   ??? HGB 03/18/2014 10.4* 11.1 - 15.9 g/dL Final   ??? HCT 03/18/2014 33.0* 34.0 - 46.6 % Final   ??? MCV 03/18/2014 92  79 - 97 fL Final   ??? MCH 03/18/2014 29.1  26.6 - 33.0 pg Final   ??? MCHC 03/18/2014 31.5  31.5 - 35.7 g/dL Final   ??? RDW 03/18/2014 14.8  12.3 - 15.4 % Final   ??? PLATELET 03/18/2014 315  150 - 379 x10E3/uL Final   ??? NEUTROPHILS 03/18/2014 58   Final   ??? Lymphocytes 03/18/2014 33   Final   ??? MONOCYTES 03/18/2014 7   Final   ??? EOSINOPHILS 03/18/2014 1   Final   ??? BASOPHILS 03/18/2014 1   Final   ??? ABS. NEUTROPHILS 03/18/2014 2.0  1.4 - 7.0 x10E3/uL Final   ??? Abs Lymphocytes 03/18/2014 1.2  0.7 - 3.1 x10E3/uL Final   ??? ABS. MONOCYTES 03/18/2014 0.3  0.1 - 0.9 x10E3/uL Final   ??? ABS. EOSINOPHILS 03/18/2014 0.1  0.0 - 0.4 x10E3/uL Final    ??? ABS. BASOPHILS 03/18/2014 0.0  0.0 - 0.2 x10E3/uL Final   ??? IMMATURE GRANULOCYTES 03/18/2014 0   Final   ??? ABS. IMM. GRANS. 03/18/2014 0.0  0.0 - 0.1 x10E3/uL Final   ??? Glucose 03/18/2014 76  65 - 99 mg/dL Final   ??? BUN 03/18/2014 9  6 - 24 mg/dL Final   ??? Creatinine 03/18/2014 0.82  0.57 - 1.00 mg/dL Final   ??? GFR est non-AA 03/18/2014 89  >59 mL/min/1.73 Final   ??? GFR est AA 03/18/2014 102  >59 mL/min/1.73 Final   ??? BUN/Creatinine ratio 03/18/2014 11  9 - 23 Final   ??? Sodium 03/18/2014 141  134 - 144 mmol/L Final   ??? Potassium 03/18/2014 3.9  3.5 - 5.2 mmol/L Final   ??? Chloride 03/18/2014 105  97 - 108 mmol/L Final   ??? CO2 03/18/2014 23  18 - 29 mmol/L Final   ??? Calcium 03/18/2014 9.3  8.7 - 10.2 mg/dL Final   ??? Protein, total 03/18/2014 7.1  6.0 - 8.5 g/dL Final   ???  Albumin 03/18/2014 4.1  3.5 - 5.5 g/dL Final   ??? GLOBULIN, TOTAL 03/18/2014 3.0  1.5 - 4.5 g/dL Final   ??? A-G Ratio 03/18/2014 1.4  1.1 - 2.5 Final   ??? Bilirubin, total 03/18/2014 0.5  0.0 - 1.2 mg/dL Final   ??? Alk. phosphatase 03/18/2014 46  39 - 117 IU/L Final   ??? AST 03/18/2014 12  0 - 40 IU/L Final   ??? ALT 03/18/2014 12  0 - 32 IU/L Final

## 2014-04-18 NOTE — Progress Notes (Signed)
Quick Note:        Sent to patient via email    ______

## 2014-04-29 MED ORDER — INDAPAMIDE 1.25 MG TAB
1.25 mg | ORAL_TABLET | ORAL | Status: DC
Start: 2014-04-29 — End: 2014-08-18

## 2014-04-29 MED ORDER — AMLODIPINE 5 MG TAB
5 mg | ORAL_TABLET | ORAL | Status: DC
Start: 2014-04-29 — End: 2014-10-08

## 2014-08-19 MED ORDER — INDAPAMIDE 1.25 MG TAB
1.25 mg | ORAL_TABLET | ORAL | Status: DC
Start: 2014-08-19 — End: 2014-10-28

## 2014-09-25 ENCOUNTER — Encounter: Payer: Self-pay | Admitting: Internal Medicine

## 2014-10-08 MED ORDER — AMLODIPINE 5 MG TAB
5 mg | ORAL_TABLET | ORAL | Status: DC
Start: 2014-10-08 — End: 2014-10-28

## 2014-10-08 NOTE — Telephone Encounter (Signed)
LOV 03-18-2014  Message was sent to [facility] to call patient to schedule an office visit with Dr. Adriana Simasook or Irving BurtonEmily for hypertension

## 2014-10-08 NOTE — Telephone Encounter (Signed)
Please call patient to schedule an office visit with Dr. Adriana Simas or Irving Burton for her blood pressure.

## 2014-10-08 NOTE — Telephone Encounter (Signed)
[facility] is checking status with refill.  They faxed over order on the 15th.  Its for the amlodopene, 5mg  (30).  [phone].

## 2014-10-20 ENCOUNTER — Other Ambulatory Visit: Payer: Self-pay | Admitting: Internal Medicine

## 2014-10-28 ENCOUNTER — Ambulatory Visit
Admit: 2014-10-28 | Discharge: 2014-10-28 | Payer: PRIVATE HEALTH INSURANCE | Attending: Family Medicine | Primary: Family

## 2014-10-28 DIAGNOSIS — I10 Essential (primary) hypertension: Secondary | ICD-10-CM

## 2014-10-28 MED ORDER — AMLODIPINE 5 MG TAB
5 mg | ORAL_TABLET | ORAL | Status: DC
Start: 2014-10-28 — End: 2016-01-22

## 2014-10-28 MED ORDER — VALACYCLOVIR 1 G TAB
1 gram | ORAL_TABLET | Freq: Every day | ORAL | Status: DC
Start: 2014-10-28 — End: 2016-01-22

## 2014-10-28 MED ORDER — INDAPAMIDE 1.25 MG TAB
1.25 mg | ORAL_TABLET | ORAL | Status: DC
Start: 2014-10-28 — End: 2016-01-22

## 2014-10-28 NOTE — Progress Notes (Signed)
HISTORY OF PRESENT ILLNESS  Michelle Benitez is a 44 y.o. female.  HPI   Chief Complaint   Patient presents with   ??? Blood Pressure Check      1 mo ago BP was fine at doctors.    Yesterday had HA all day long.  Medication helped headache.    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  Given valacyclovir.  Itching started after completing Rx.  She is very distraught about this diagnosis.     She request mammogram referral.   Vitamin d - not currently supplementing.  Reviewed prior labs/deficiency    Allergies   Allergen Reactions   ??? Influenza Virus Vaccine, Specific Swelling     Happened 2 years in a row.   ??? Lisinopril Angioedema     Lip swelling on 07/10/13 while taking lisinopril-HCTZ     Current Outpatient Prescriptions   Medication Sig   ??? VESICARE 5 mg tablet Take 5 mg by mouth daily.   ??? valACYclovir (VALTREX) 1 gram tablet Take 1 Tab by mouth daily.   ??? amLODIPine (NORVASC) 5 mg tablet TAKE 1 TABLET BY MOUTH EVERY DAY   ??? indapamide (LOZOL) 1.25 mg tablet TAKE 1 TABLET BY MOUTH DAILY   ??? FINACEA 15 % topical gel      No current facility-administered medications for this visit.      Past Medical History   Diagnosis Date   ??? Anemia      Last CBC: h/h 10.1/29.8 (10/2008)   ??? Headache    ??? 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 Repeat Pap in 1 year   ??? Hypertension    ??? Vitamin D deficiency 10/14/2013     Past Surgical History   Procedure Laterality Date   ??? Hx other surgical       Eye surgery   ??? Hx tubal ligation     ??? Hx cesarean section       x2   ??? Hx dilation and curettage  04/14/14     ROS    Physical Exam   Constitutional: She is oriented to person, place, and time. She appears well-developed and well-nourished. No distress.   HENT:   Right Ear: External ear normal.   Left Ear: External ear normal.   Eyes: Conjunctivae, EOM and lids are normal.   Neurological: She is alert and oriented to person, place, and time. No  cranial nerve deficit. Coordination and gait normal.   Skin: She is not diaphoretic.   Psychiatric: Her speech is normal and behavior is normal. Judgment and thought content normal. Her mood appears anxious. Cognition and memory are normal.       ASSESSMENT and PLAN    ICD-10-CM ICD-9-CM    1. Essential hypertension with goal blood pressure less than 140/90 - good control. No change I10 401.9 amLODIPine (NORVASC) 5 mg tablet      indapamide (LOZOL) 1.25 mg tablet   2. 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  Counseling and support given.  May not be herpes.  She prefers daily suppressive therapy currently given recent recurrences, but discussed PRN use as well A60.04 054.11 valACYclovir (VALTREX) 1 gram tablet   4. Vitamin D deficiency - encouraged supplementation E55.9 268.9       >25 min spent face to  face with patient with more than 50% of the time spent in counseling and care coordination regarding above concerns .

## 2014-10-28 NOTE — Progress Notes (Signed)
Chief Complaint   Patient presents with   ??? Blood Pressure Check     1. Have you been to the ER, urgent care clinic since your last visit?  Hospitalized since your last visit?Yes When: 04/14/14 [facility] Reason for visit: D &C    2. Have you seen or consulted any other health care providers outside of the [facility] since your last visit?  Include any pap smears or colon screening. No     The patient was counseled on the dangers of tobacco use, and Patient is a non smoker. Reviewed strategies to maximize success, including Continue not to smoke.  I have reviewed Health Maintenance with the patient and updated.  Advance Care Planning information reviewed and given to the patient.

## 2014-10-28 NOTE — Patient Instructions (Signed)
Valacylovir - 500 or 1000mg  can be used to suppress the herpes virus.  If you wanted to just use this as needed, then at the onset of an outbreak you would take 1000mg  (1 gram) daily for 5 days to treat it.     Keep the skin calm with moisturizers (Vasaline or similar gentle/hypoallergenic option).     Http://www.thevpfoundation.org/  The above website discusses that some women can get vulvar pain/itching/irration related to excess oxalates in the diet.     Vitamin d - 4-5,000 units per day OR roughly 30,000 units once weekly should be adequate to keep your vitamin d level in the normal range.

## 2014-10-30 ENCOUNTER — Other Ambulatory Visit: Payer: Self-pay | Admitting: Internal Medicine

## 2015-01-19 ENCOUNTER — Ambulatory Visit: Payer: BC Managed Care – PPO | Admitting: Obstetrics & Gynecology

## 2015-04-21 ENCOUNTER — Encounter: Attending: Family Medicine | Primary: Family

## 2015-05-04 ENCOUNTER — Ambulatory Visit
Admit: 2015-05-04 | Discharge: 2015-05-04 | Payer: PRIVATE HEALTH INSURANCE | Attending: Family Medicine | Primary: Family

## 2015-05-04 DIAGNOSIS — F320 Major depressive disorder, single episode, mild: Secondary | ICD-10-CM

## 2015-05-04 NOTE — Progress Notes (Signed)
Chief Complaint   Patient presents with   ??? Establish Care   ??? Medication Problem     stated that she thinks her B/P meds aren't working she's been getting a very abnormal B/P, she also stated that she has been having a headache for about 2 weeks, not sure if her valtrex meds are working either    ??? Sleep Problem     having some horrible dreams about her past not able to sleep    She also stated that she was given suppositories for hemorrhoids but stated that they didn't work, and would like to see if something else can be prescribed      1. Have you been to the ER, urgent care clinic since your last visit?  Hospitalized since your last visit? No     2. Have you seen or consulted any other health care providers outside of the [facility] since your last visit?  Include any pap smears or colon screening. No     The patient was counseled on the dangers of tobacco use, and was never to start .  Reviewed strategies to maximize success, including never to start.    Health Maintenance Due   Topic Date Due   ??? DTaP/Tdap/Td series (1 - Tdap) She stated she will receive it today.  05/15/1992     ACP is not on file, booklet given advised to return

## 2015-05-04 NOTE — Patient Instructions (Addendum)
TODAY, please go to:   CHECK OUT    Have Tdap today       Please schedule the following appointments at CHECK OUT:  ?? Skin lesion and mood follow up with Dr. Maisie Fus in 1-2 weeks    _____________________     Today's Plan:  Use the following to improve your blood pressure control  1. Physical activity  2. Healthy food choices- consider the DASH diet. Lots of fruit and vegetables. Less sugars. Limit your sodium.  3. Achieve and maintain a healthy weight  4. Limit or eliminate alcohol  5. Take blood pressure medication as directed    Home Blood Pressure Monitoring  1. Check your blood pressure daily.  2. Record your blood pressure.  3. Bring your blood pressure log and home cuff to your next appointment with Dr. Maisie Fus.     Consider taking melatonin OTC to sleep    Go to [email or web address] and choose a counselor     Keys to Better Sleep   ?? Moderate-intensity exercise should not occur just before bedtime  ?? Consider relaxation therapy or meditation  ?? Avoid bright lights (including television)  ?? Avoid noise and temperature extremes;  ?? Avoid large meals, caffeine, tobacco, and alcohol at night  ?? Minimize evening fluid intake  ?? Leave the bedroom if unable to fall asleep within 20 minutes  ?? Limit use of the bedroom to sleep and intimacy  ?? Aim for consistent time of waking  ?? Minimize daytime naps    Adapted from [email or web address]://www.schwartz.org/    _____________________     Review your health maintenance below. Make plans to return and address anything that is due or will be due soon.   Health Maintenance Due   Topic Date Due   ??? DTaP/Tdap/Td  (1 - Tdap) 05/15/1992
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
## 2015-05-19 ENCOUNTER — Ambulatory Visit: Admit: 2015-05-19 | Payer: PRIVATE HEALTH INSURANCE | Attending: Family Medicine | Primary: Family

## 2015-05-19 DIAGNOSIS — A6000 Herpesviral infection of urogenital system, unspecified: Secondary | ICD-10-CM

## 2015-05-19 NOTE — Patient Instructions (Signed)
TODAY, please go to:   CHECK OUT     Please schedule the following appointments at CHECK OUT:  ?? Mood follow up with Dr. Maisie Fus in 6 months    _____________________     Today's Plan:  ?? Use anusol  ?? Schedule to see general surgery   _____________________     Review your health maintenance below. Make plans to return and address anything that is due or will be due soon.   There are no preventive care reminders to display for this patient.

## 2015-05-19 NOTE — Progress Notes (Signed)
[facility]  Clinic Visit   Patient ID:   Michelle Benitez is a 45 y.o. female.  Assessment/Plan:    Michelle Benitez was seen today for lesion, anxiety and hemorrhoids.    Diagnoses and all orders for this visit:    Genital herpes simplex, unspecified site  No lesion present today. Counseled on transmission of HSV. Provided support re stress of this dx. Encouraged open, healthy, safe dialog with partner as needed.    External hemorrhoids  Sx frequently. rx for anusol. Referred to gen surg to discuss surgical mx.  -     REFERRAL TO GENERAL SURGERY  -     hydrocortisone (ANUSOL-HC) 25 mg supp; Insert 1 Suppository into rectum every twelve (12) hours for 14 days. And then when needed for itch, bleeding, pain    Anxiety  F/u as outlined. Pt to establish with therapist      Counselled pt on:  Patient health concerns.   Patient was offered a choice/choices in the treatment plan today.  Patient expresses understanding of the plan and agrees with recommendations.    Patient Instructions   TODAY, please go to:   CHECK OUT     Please schedule the following appointments at CHECK OUT:  ?? Mood follow up with Dr. Maisie Fus in 6 months    _____________________     Today's Plan:  ?? Use anusol  ?? Schedule to see general surgery   _____________________     Review your health maintenance below. Make plans to return and address anything that is due or will be due soon.   There are no preventive care reminders to display for this patient.        ?  Subjective:   HPI:  Michelle Benitez is a 45 y.o. female being seen for:   Chief Complaint   Patient presents with   ??? Lesion     skin 1 week follow up   ??? Anxiety     1 week follow up   ??? Hemorrhoids     anxiety  ?? Has not been able to find counselor yet  ?? No sleep change yet    Skin lesion  ?? Vaginal lesion X1 week, gone now. Has been taking valtrex daily   ?? Only had one spot in the past at the same area. Did sample and came back as such.    ?? When first diagnosed with HSV, she says her doctor actually sampled the lesion and sent to lab.  ?? She has discussed this with current partner    hemorrhoids  ?? Present for years  ?? They get irritated, painful, itchy  ?? Has tried suppositories in the past  ?? Wonders about surgery     Review of Systems  Otherwise, per HPI  Active Problem List:  Patient Active Problem List   Diagnosis Code   ??? Hypertension I10   ??? Vitamin D deficiency E55.9   ??? Urinary frequency R35.0   ??? Menorrhagia with irregular cycle N92.1   ??? Anemia D64.9   ??? 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 Anxiety F41.9     ?  Objective:     Visit Vitals   ??? BP 117/83 (BP 1 Location: Left arm, BP Patient Position: Sitting)   ??? Pulse 71   ??? Temp 99.1 ??F (37.3 ??C) (Oral)   ??? Resp 16   ??? Ht 5' 7.01" (1.702 m)   ??? Wt 177 lb 11.2 oz (80.6 kg)   ??? SpO2  99%   ??? BMI 27.83 kg/m2     Physical Exam   Constitutional: She appears well-developed and well-nourished. No distress.   Pulmonary/Chest: Effort normal. No respiratory distress.   Genitourinary: Vagina normal. Rectal exam shows external hemorrhoid (not thrombosed. skin toned, soft, nontender today). There is no rash, lesion or injury on the right labia. There is no rash, lesion or injury on the left labia. No bleeding in the vagina. No vaginal discharge found.   Genitourinary Comments: No lesions seen in vagina or external skin   Neurological: She is alert.   Psychiatric: She has a normal mood and affect. Her behavior is normal.     Allergies   Allergen Reactions   ??? Influenza Virus Vaccine, Specific Swelling     Happened 2 years in a row.   ??? Lisinopril Angioedema     Lip swelling on 07/10/13 while taking lisinopril-HCTZ     Prior to Admission medications    Medication Sig Start Date End Date Taking? Authorizing Provider   hydrocortisone (ANUSOL-HC) 25 mg supp Insert 1 Suppository into rectum every twelve (12) hours for 14 days. And then when needed for itch, bleeding, pain 05/19/15 06/02/15 Yes Barrington Ellison. Maisie Fus, MD    valACYclovir (VALTREX) 1 gram tablet Take 1 Tab by mouth daily. 10/28/14  Yes Titus Dubin, MD   amLODIPine (NORVASC) 5 mg tablet TAKE 1 TABLET BY MOUTH EVERY DAY 10/28/14  Yes Titus Dubin, MD   indapamide (LOZOL) 1.25 mg tablet TAKE 1 TABLET BY MOUTH DAILY 10/28/14  Yes Titus Dubin, MD

## 2015-05-19 NOTE — Progress Notes (Signed)
Chief Complaint   Patient presents with   ??? Lesion     skin 1 week follow up   ??? Anxiety     1 week follow up   ??? Hemorrhoids   '    1. Have you been to the ER, urgent care clinic since your last visit?  Hospitalized since your last visit? no    2. Have you seen or consulted any other health care providers outside of the [facility] since your last visit?  Include any pap smears or colon screening. no

## 2015-05-20 MED ORDER — HYDROCORTISONE ACETATE 25 MG RECTAL SUPPOSITORY
25 mg | Freq: Two times a day (BID) | RECTAL | 1 refills | Status: AC
Start: 2015-05-20 — End: 2015-06-02

## 2015-11-10 ENCOUNTER — Ambulatory Visit
Admit: 2015-11-10 | Discharge: 2015-11-10 | Payer: PRIVATE HEALTH INSURANCE | Attending: Family Medicine | Primary: Family

## 2015-11-10 DIAGNOSIS — R519 Headache, unspecified: Secondary | ICD-10-CM

## 2015-11-10 LAB — AMB POC URINALYSIS DIP STICK AUTO W/O MICRO
Bilirubin (UA POC): NEGATIVE
Glucose (UA POC): NEGATIVE
Ketones (UA POC): NEGATIVE
Leukocyte esterase (UA POC): NEGATIVE
Nitrites (UA POC): NEGATIVE
Protein (UA POC): NEGATIVE mg/dL
Specific gravity (UA POC): 1.005 (ref 1.001–1.035)
Urobilinogen (UA POC): 0.2 (ref 0.2–1)
pH (UA POC): 7 (ref 4.6–8.0)

## 2015-11-10 MED ORDER — BUTALBITAL-ACETAMINOPHEN-CAFFEINE 50 MG-325 MG-40 MG TAB
50-325-40 mg | ORAL_TABLET | Freq: Every day | ORAL | 0 refills | Status: DC | PRN
Start: 2015-11-10 — End: 2017-08-16

## 2015-11-10 MED ORDER — NAPROXEN 500 MG TAB
500 mg | ORAL_TABLET | Freq: Two times a day (BID) | ORAL | 1 refills | Status: DC | PRN
Start: 2015-11-10 — End: 2015-12-31

## 2015-11-10 MED ORDER — SOLIFENACIN 5 MG TAB
5 mg | ORAL_TABLET | Freq: Every day | ORAL | 5 refills | Status: DC
Start: 2015-11-10 — End: 2016-09-01

## 2015-11-10 NOTE — Progress Notes (Signed)
Chief Complaint   Patient presents with   ??? Headache     stated that the haeadaches come and goe and can be so bad that at times she feels dizzy and has pains shooting down her back and neck.    ??? Complete Physical   ??? Anxiety     follow up      1. Have you been to the ER, urgent care clinic since your last visit?  Hospitalized since your last visit? No     2. Have you seen or consulted any other health care providers outside of the [facility] since your last visit?  Include any pap smears or colon screening. No    The patient was counseled on the dangers of tobacco use, and was advised to never start.  Reviewed strategies to maximize success, including never to start.    Health Maintenance Due   Topic Date Due   ??? INFLUENZA AGE 47 TO ADULT Discussed with patient today and advised to follow up.    10/20/2015     ACP is not on file, advised to return

## 2015-11-10 NOTE — Patient Instructions (Addendum)
.  TODAY, please go to:   CHECK OUT    LAB   Urine   Vaginal swab    Please schedule the following appointments at CHECK OUT:  ?? Complete Physical Exam, headache and lab follow up with Dr. Maisie Fushomas in Sept or Oct 2017  ?? Lab visit, fasting the week before our visit.      Today's Plan:  - Flu season is coming! Remember to get your flu vaccine!   - take naprosyn when needed  - minimize stress  - can combine naprosyn with EXCEDRIN TENSION HEADACHE (tylenol and caffeine)  - start vesicare  - we will call if labs are abnormal

## 2015-11-10 NOTE — Progress Notes (Signed)
..  [facility]  Clinic Visit   Patient ID:   Janese BanksKareen Benner is a 45 y.o. female.  Assessment/Plan:    Diagnoses and all orders for this visit:    1. Nonintractable headache, unspecified chronicity pattern, unspecified headache type  eval for tick borne illness. fioricet prn headache. HA may be multifactorial. Likely tension component  -     CBC WITH AUTOMATED DIFF  -     METABOLIC PANEL, COMPREHENSIVE  -     URINALYSIS W/ RFLX MICROSCOPIC  -     LYME AB, IGM, WITH REFLEX WBLOT  -     EHRLICHIOSIS (HME AND HGE) AB PANEL  -     R RICKETTSII AB IGG W/REFL  -     NUSWAB VAGINITIS PLUS  -     butalbital-acetaminophen-caffeine (FIORICET, ESGIC) 50-325-40 mg per tablet; Take 1 Tab by mouth daily as needed (severe headache).  -     naproxen (NAPROSYN) 500 mg tablet; Take 1 Tab by mouth two (2) times daily as needed. Indications: headache    2. Urinary urgency  -     AMB POC URINALYSIS DIP STICK MANUAL W/O MICRO  -     CULTURE, URINE  -     solifenacin (VESICARE) 5 mg tablet; Take 1 Tab by mouth daily.  -     AMB POC URINALYSIS DIP STICK AUTO W/O MICRO    Other orders  -     CULTURE, URINE  -     MICROSCOPIC EXAMINATION  -     LYME DISEASE, WESTERN BLOT       Next visit: CPE    See patient instructions for more.    Counselled pt on:  Patient health concerns, plan as outlined in patient instructions and above.  Patient was offered a choice/choices in the treatment plan today.  Patient expresses understanding of the plan and agrees with recommendations.  Patient Instructions   .TODAY, please go to:   CHECK OUT    LAB   Urine   Vaginal swab    Please schedule the following appointments at CHECK OUT:  ?? Complete Physical Exam, headache and lab follow up with Dr. Maisie Fushomas in Sept or Oct 2017  ?? Lab visit, fasting the week before our visit.      Today's Plan:  - Flu season is coming! Remember to get your flu vaccine!   - take naprosyn when needed  - minimize stress   - can combine naprosyn with EXCEDRIN TENSION HEADACHE (tylenol and caffeine)  - start vesicare  - we will call if labs are abnormal        Subjective:   HPI:  Janese BanksKareen Hankerson is a 45 y.o. female being seen for:   Chief Complaint   Patient presents with   ??? Headache     stated that the haeadaches come and goe and can be so bad that at times she feels dizzy and has pains shooting down her back and neck.    ??? Complete Physical   ??? Anxiety     follow up      Headaches   ??  says she has been trying to get in for about a month  ?? Had 800mg  ibuprofen and tried those instead of UC. Now thinks she should have gone to UC because   ?? 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 Turned in assignments. She finished an assignment a day late because of headache.  Then this week noted that she got an F because she did not turn in assignment on time. But now sx have started again.   ?? Had fever with headache    ?? Headaches started about 5/10, at right eye then gradually strengthen.  ?? At end of July, early August more intense.   ?? Radiates into neck   ?? By the time she got home had subjective fever, fatigue, body aches. Took ibuprofen with some relief. Dizziness as in lightheaded. Slept more that night.  ?? The next day felt better, went to work, then sx recurred in PM.   ?? To [place] last week  ?? No tick bites  ?? Went camping with students in [place] of [address]  ?? No rash.   ?? No HA last week, then returned after a challenging conversation   ?? HA lasts for hours.     Urgency  ?? Was on vesicare in past. Has been having urgency X 1 month. No dysuria, hematuria.   ?? Had a fever with head      Screening and Prevention Due:  Health Maintenance Due   Topic Date Due   ??? INFLUENZA AGE 38 TO ADULT  10/20/2015        Review of Systems  Otherwise as noted in HPI    Active Problem List:  Patient Active Problem List   Diagnosis Code   ??? Hypertension I10   ??? Vitamin D deficiency E55.9   ??? Urinary frequency R35.0    ??? Menorrhagia with irregular cycle N92.1   ??? Anemia D64.9   ??? 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 Anxiety F41.9     ?  Objective:     Visit Vitals   ??? BP 136/84 (BP 1 Location: Left arm, BP Patient Position: Sitting)   ??? Pulse 73   ??? Temp 97.6 ??F (36.4 ??C) (Oral)   ??? Resp 18   ??? Ht 5\' 7"  (1.702 m)   ??? Wt 183 lb (83 kg)   ??? SpO2 97%   ??? BMI 28.66 kg/m2     Wt Readings from Last 3 Encounters:   11/10/15 183 lb (83 kg)   05/19/15 177 lb 11.2 oz (80.6 kg)   05/04/15 179 lb (81.2 kg)     BP Readings from Last 3 Encounters:   11/10/15 136/84   05/19/15 117/83   05/04/15 132/84     PHQ over the last two weeks 11/10/2015   Little interest or pleasure in doing things Not at all   Feeling down, depressed or hopeless Not at all   Total Score PHQ 2 0     Physical Exam   Cardiovascular: Normal rate, regular rhythm and normal heart sounds.  Exam reveals no gallop and no friction rub.    No murmur heard.  Pulmonary/Chest: Effort normal. No accessory muscle usage. No respiratory distress. She has no decreased breath sounds. She has no wheezes. She has no rhonchi. She has no rales.   Abdominal: Soft. Normal appearance and bowel sounds are normal. She exhibits no distension, no fluid wave and no mass. There is no hepatosplenomegaly. There is no tenderness. There is no rigidity, no rebound and no guarding.   Neurological: She is alert. She has normal strength and normal reflexes. No cranial nerve deficit or sensory deficit. She exhibits normal muscle tone. She displays a negative Romberg sign. Coordination and gait normal. GCS eye subscore is 4. GCS verbal subscore is 5. GCS motor subscore  is 6.   Reflex Scores:       Bicep reflexes are 2+ on the right side and 2+ on the left side.       Patellar reflexes are 2+ on the right side and 2+ on the left side.  Nl FTN, HTS, RAM   Skin: No rash noted.     Allergies   Allergen Reactions   ??? Influenza Virus Vaccine, Specific Swelling     Happened 2 years in a row.   ??? Lisinopril Angioedema      Lip swelling on 07/10/13 while taking lisinopril-HCTZ     Prior to Admission medications    Medication Sig Start Date End Date Taking? Authorizing Provider   solifenacin (VESICARE) 5 mg tablet Take 1 Tab by mouth daily. 11/10/15  Yes Barrington Ellisonlaire W. Maisie Fushomas, MD   butalbital-acetaminophen-caffeine (FIORICET, ESGIC) 50-325-40 mg per tablet Take 1 Tab by mouth daily as needed (severe headache). 11/10/15  Yes Barrington Ellisonlaire W. Maisie Fushomas, MD   naproxen (NAPROSYN) 500 mg tablet Take 1 Tab by mouth two (2) times daily as needed. Indications: headache 11/10/15  Yes Barrington Ellisonlaire W. Maisie Fushomas, MD   valACYclovir (VALTREX) 1 gram tablet Take 1 Tab by mouth daily. 10/28/14  Yes Titus DubinAndrew D Cook, MD   amLODIPine (NORVASC) 5 mg tablet TAKE 1 TABLET BY MOUTH EVERY DAY 10/28/14  Yes Titus DubinAndrew D Cook, MD   indapamide (LOZOL) 1.25 mg tablet TAKE 1 TABLET BY MOUTH DAILY 10/28/14  Yes Titus DubinAndrew D Cook, MD

## 2015-11-13 LAB — CULTURE, URINE

## 2015-11-13 NOTE — Progress Notes (Signed)
Low cfu. Will not treat. If new sx will repeat test.     Hematuria needs follow up as well, starting with repeat U/A on follow up  _____________________   Please call patient:  Urine grew a small amount of bacteria, but not enough to treat. No antibiotic needed. If she has new or worsening symptoms, please let us know.   There was a small amount of blood in the urine. We will need to repeat this test on follow up.

## 2015-11-14 LAB — EHRLICHIOSIS (HME AND HGE) AB PANEL
E. chaffeensis (HME) IgM titer: NEGATIVE
E. chaffeensis IgG: NEGATIVE
HGE IgG: NEGATIVE
HGE IgM: NEGATIVE

## 2015-11-14 LAB — CBC WITH AUTOMATED DIFF
ABS. BASOPHILS: 0 10*3/uL (ref 0.0–0.2)
ABS. EOSINOPHILS: 0 10*3/uL (ref 0.0–0.4)
ABS. IMM. GRANS.: 0 10*3/uL (ref 0.0–0.1)
ABS. MONOCYTES: 0.4 10*3/uL (ref 0.1–0.9)
ABS. NEUTROPHILS: 1.7 10*3/uL (ref 1.4–7.0)
Abs Lymphocytes: 1.4 10*3/uL (ref 0.7–3.1)
BASOPHILS: 0 %
EOSINOPHILS: 1 %
HCT: 36.2 % (ref 34.0–46.6)
HGB: 11.8 g/dL (ref 11.1–15.9)
IMMATURE GRANULOCYTES: 1 %
Lymphocytes: 41 %
MCH: 31 pg (ref 26.6–33.0)
MCHC: 32.6 g/dL (ref 31.5–35.7)
MCV: 95 fL (ref 79–97)
MONOCYTES: 10 %
NEUTROPHILS: 47 %
PLATELET: 367 10*3/uL (ref 150–379)
RBC: 3.81 x10E6/uL (ref 3.77–5.28)
RDW: 13.9 % (ref 12.3–15.4)
WBC: 3.5 10*3/uL (ref 3.4–10.8)

## 2015-11-14 LAB — MICROSCOPIC EXAMINATION: Casts: NONE SEEN /lpf

## 2015-11-14 LAB — METABOLIC PANEL, COMPREHENSIVE
A-G Ratio: 1.3 (ref 1.2–2.2)
ALT (SGPT): 10 IU/L (ref 0–32)
AST (SGOT): 15 IU/L (ref 0–40)
Albumin: 4.4 g/dL (ref 3.5–5.5)
Alk. phosphatase: 47 IU/L (ref 39–117)
BUN/Creatinine ratio: 13 (ref 9–23)
BUN: 11 mg/dL (ref 6–24)
Bilirubin, total: 0.5 mg/dL (ref 0.0–1.2)
CO2: 28 mmol/L (ref 18–29)
Calcium: 9.9 mg/dL (ref 8.7–10.2)
Chloride: 101 mmol/L (ref 96–106)
Creatinine: 0.87 mg/dL (ref 0.57–1.00)
GFR est AA: 94 mL/min/{1.73_m2} (ref >59)
GFR est non-AA: 81 mL/min/{1.73_m2} (ref >59)
GLOBULIN, TOTAL: 3.4 g/dL (ref 1.5–4.5)
Glucose: 95 mg/dL (ref 65–99)
Potassium: 4.1 mmol/L (ref 3.5–5.2)
Protein, total: 7.8 g/dL (ref 6.0–8.5)
Sodium: 141 mmol/L (ref 134–144)

## 2015-11-14 LAB — URINALYSIS W/ RFLX MICROSCOPIC
Bilirubin: NEGATIVE
Glucose: NEGATIVE
Ketone: NEGATIVE
Nitrites: NEGATIVE
Protein: NEGATIVE
Specific Gravity: 1.023 (ref 1.005–1.030)
Urobilinogen: 0.2 mg/dL (ref 0.2–1.0)
pH (UA): 7 (ref 5.0–7.5)

## 2015-11-14 LAB — LYME DISEASE, WESTERN BLOT
IgG P18 Ab: ABSENT
IgG P23 Ab: ABSENT
IgG P28 Ab: ABSENT
IgG P30 Ab: ABSENT
IgG P39 Ab: ABSENT
IgG P41 Ab: ABSENT
IgG P45 Ab: ABSENT
IgG P58 Ab: ABSENT
IgG P66 Ab: ABSENT
IgG P93 Ab: ABSENT
IgM P23 Ab: ABSENT
IgM P39 Ab: ABSENT
IgM P41 Ab: ABSENT
Lyme Ab, IgG WB Interp.: NEGATIVE
Lyme Ab, IgM WB Interp.: NEGATIVE

## 2015-11-14 LAB — LYME AB, IGM, WITH REFLEX WBLOT: Lyme Disease Ab, IgM, QT: 1.14 index — ABNORMAL HIGH (ref 0.00–0.79)

## 2015-11-14 LAB — R RICKETTSII AB IGG W/REFL: R. rickettsii, IgG: NEGATIVE

## 2015-11-15 LAB — NUSWAB VAGINITIS PLUS
C. albicans, NAA: NEGATIVE
C. glabrata, NAA: NEGATIVE
C. trachomatis, NAA: NEGATIVE
N. gonorrhoeae, NAA: NEGATIVE
T. vaginalis, NAA: NEGATIVE

## 2015-11-16 NOTE — Progress Notes (Signed)
Spoke with patient and I.D. X 2 informed patient that there was a small amount of blood in urine and a small amount of bacteria but not enough to treat with an antibiotic. Informed patient that test will need to be repeated at follow up, but if symptoms worsen than to let us know. Patient verbalized understanding.

## 2015-12-03 NOTE — Progress Notes (Signed)
Lyme test was equivocal.   Your test is negative for bacterial vaginosis, yeast infection, gonorrhea, chlamydia, and trichomonas.   Other tick illnesses- negative.       Please call patient and ask how she is feeling. Headache? Urine symptoms?

## 2015-12-04 NOTE — Progress Notes (Signed)
Called patient at 08:41 am; No pick-up at this time. Mailbox is full at this time. Will try again later.

## 2015-12-04 NOTE — Progress Notes (Signed)
Spoke with patient, ID verified X 2; Spoken with patient regarding to lab results @ 09:17am . Patient states as well no symptoms.Patient understood

## 2015-12-24 ENCOUNTER — Other Ambulatory Visit: Admit: 2015-12-24 | Discharge: 2015-12-24 | Payer: PRIVATE HEALTH INSURANCE | Primary: Family

## 2015-12-24 DIAGNOSIS — Z0000 Encounter for general adult medical examination without abnormal findings: Secondary | ICD-10-CM

## 2015-12-25 LAB — METABOLIC PANEL, COMPREHENSIVE
A-G Ratio: 1.5 (ref 1.2–2.2)
ALT (SGPT): 12 IU/L (ref 0–32)
AST (SGOT): 18 IU/L (ref 0–40)
Albumin: 4.4 g/dL (ref 3.5–5.5)
Alk. phosphatase: 41 IU/L (ref 39–117)
BUN/Creatinine ratio: 11 (ref 9–23)
BUN: 10 mg/dL (ref 6–24)
Bilirubin, total: 0.7 mg/dL (ref 0.0–1.2)
CO2: 28 mmol/L (ref 18–29)
Calcium: 9.7 mg/dL (ref 8.7–10.2)
Chloride: 101 mmol/L (ref 96–106)
Creatinine: 0.88 mg/dL (ref 0.57–1.00)
GFR est AA: 92 mL/min/{1.73_m2} (ref >59)
GFR est non-AA: 80 mL/min/{1.73_m2} (ref >59)
GLOBULIN, TOTAL: 2.9 g/dL (ref 1.5–4.5)
Glucose: 98 mg/dL (ref 65–99)
Potassium: 3.8 mmol/L (ref 3.5–5.2)
Protein, total: 7.3 g/dL (ref 6.0–8.5)
Sodium: 142 mmol/L (ref 134–144)

## 2015-12-25 LAB — CBC W/O DIFF
HCT: 36.2 % (ref 34.0–46.6)
HGB: 11.5 g/dL (ref 11.1–15.9)
MCH: 30.3 pg (ref 26.6–33.0)
MCHC: 31.8 g/dL (ref 31.5–35.7)
MCV: 96 fL (ref 79–97)
PLATELET: 266 10*3/uL (ref 150–379)
RBC: 3.79 x10E6/uL (ref 3.77–5.28)
RDW: 14.7 % (ref 12.3–15.4)
WBC: 2.9 10*3/uL — ABNORMAL LOW (ref 3.4–10.8)

## 2015-12-25 LAB — URINALYSIS W/ RFLX MICROSCOPIC
Bilirubin: NEGATIVE
Blood: NEGATIVE
Glucose: NEGATIVE
Ketone: NEGATIVE
Leukocyte Esterase: NEGATIVE
Nitrites: NEGATIVE
Protein: NEGATIVE
Specific Gravity: 1.018 (ref 1.005–1.030)
Urobilinogen: 1 mg/dL (ref 0.2–1.0)
pH (UA): 7 (ref 5.0–7.5)

## 2015-12-25 LAB — VITAMIN D, 25 HYDROXY: VITAMIN D, 25-HYDROXY: 29.4 ng/mL — ABNORMAL LOW (ref 30.0–100.0)

## 2015-12-25 LAB — LIPID PANEL
Cholesterol, total: 163 mg/dL (ref 100–199)
HDL Cholesterol: 58 mg/dL (ref >39)
LDL, calculated: 96 mg/dL (ref 0–99)
Triglyceride: 47 mg/dL (ref 0–149)
VLDL, calculated: 9 mg/dL (ref 5–40)

## 2015-12-25 LAB — CVD REPORT

## 2015-12-25 LAB — HEMOGLOBIN A1C WITH EAG
Estimated average glucose: 103 mg/dL
Hemoglobin A1c: 5.2 % (ref 4.8–5.6)

## 2015-12-31 ENCOUNTER — Ambulatory Visit
Admit: 2015-12-31 | Discharge: 2015-12-31 | Payer: PRIVATE HEALTH INSURANCE | Attending: Family Medicine | Primary: Family

## 2015-12-31 DIAGNOSIS — Z0000 Encounter for general adult medical examination without abnormal findings: Secondary | ICD-10-CM

## 2015-12-31 NOTE — Patient Instructions (Addendum)
TODAY, please go to:      CHECK OUT    LAB    Please schedule the following appointments at CHECK OUT:  ?? Blood pressure follow up with Dr.Thomas in April 2018      Today's Plan:    take vitamin D3 1000 units by mouth daily        Well Visit, Ages 3618 to 2650: Care Instructions  Your Care Instructions  Physical exams can help you stay healthy. Your doctor has checked your overall health and may have suggested ways to take good care of yourself. He or she also may have recommended tests. At home, you can help prevent illness with healthy eating, regular exercise, and other steps.  Follow-up care is a key part of your treatment and safety. Be sure to make and go to all appointments, and call your doctor if you are having problems. It's also a good idea to know your test results and keep a list of the medicines you take.  How can you care for yourself at home?  ?? Reach and stay at a healthy weight. This will lower your risk for many problems, such as obesity, diabetes, heart disease, and high blood pressure.  ?? Get at least 30 minutes of physical activity on most days of the week. Walking is a good choice. You also may want to do other activities, such as running, swimming, cycling, or playing tennis or team sports. Discuss any changes in your exercise program with your doctor.  ?? Do not smoke or allow others to smoke around you. If you need help quitting, talk to your doctor about stop-smoking programs and medicines. These can increase your chances of quitting for good.  ?? Talk to your doctor about whether you have any risk factors for sexually transmitted infections (STIs). Having one sex partner (who does not have STIs and does not have sex with anyone else) is a good way to avoid these infections.  ?? Use birth control if you do not want to have children at this time. Talk with your doctor about the choices available and what might be best for you.   ?? Protect your skin from too much sun. When you're outdoors from 10 a.m. to 4 p.m., stay in the shade or cover up with clothing and a hat with a wide brim. Wear sunglasses that block UV rays. Even when it's cloudy, put broad-spectrum sunscreen (SPF 30 or higher) on any exposed skin.  ?? See a dentist one or two times a year for checkups and to have your teeth cleaned.  ?? Wear a seat belt in the car.  ?? Drink alcohol in moderation, if at all. That means no more than 2 drinks a day for men and 1 drink a day for women.  Follow your doctor's advice about when to have certain tests. These tests can spot problems early.  For everyone  ?? Cholesterol. Have the fat (cholesterol) in your blood tested after age 44. Your doctor will tell you how often to have this done based on your age, family history, or other things that can increase your risk for heart disease.  ?? Blood pressure. Have your blood pressure checked during a routine doctor visit. Your doctor will tell you how often to check your blood pressure based on your age, your blood pressure results, and other factors.  ?? Vision. Talk with your doctor about how often to have a glaucoma test.  ?? Diabetes. Ask your doctor whether you  should have tests for diabetes.  ?? Colon cancer. Have a test for colon cancer at age 44. You may have one of several tests. If you are younger than 9350, you may need a test earlier if you have any risk factors. Risk factors include whether you already had a precancerous polyp removed from your colon or whether your parent, brother, sister, or child has had colon cancer.  For women  ?? Breast exam and mammogram. Talk to your doctor about when you should have a clinical breast exam and a mammogram. Medical experts differ on whether and how often women under 50 should have these tests. Your doctor can help you decide what is right for you.  ?? Pap test and pelvic exam. Begin Pap tests at age 44. A Pap test is the  best way to find cervical cancer. The test often is part of a pelvic exam. Ask how often to have this test.  ?? Tests for sexually transmitted infections (STIs). Ask whether you should have tests for STIs. You may be at risk if you have sex with more than one person, especially if your partners do not wear condoms.  For men  ?? Tests for sexually transmitted infections (STIs). Ask whether you should have tests for STIs. You may be at risk if you have sex with more than one person, especially if you do not wear a condom.  ?? Testicular cancer exam. Ask your doctor whether you should check your testicles regularly.  ?? Prostate exam. Talk to your doctor about whether you should have a blood test (called a PSA test) for prostate cancer. Experts differ on whether and when men should have this test. Some experts suggest it if you are older than 1745 and are African-American or have a father or brother who got prostate cancer when he was younger than 9365.  When should you call for help?  Watch closely for changes in your health, and be sure to contact your doctor if you have any problems or symptoms that concern you.  Where can you learn more?  Go to InsuranceStats.cahttp://www.healthwise.net/GoodHelpConnections.  Enter P072 in the search box to learn more about "Well Visit, Ages 118 to 7550: Care Instructions."  Current as of: October 07, 2014  Content Version: 11.3  ?? 2006-2017 [employer]wise[employer]. Care instructions adapted under license by [employer] (which disclaims liability or warranty for this information). If you have questions about a medical condition or this instruction, always ask your healthcare professional. Healthwise, Incorporated disclaims any warranty or liability for your use of this information.

## 2015-12-31 NOTE — Progress Notes (Signed)
Chief Complaint   Patient presents with   ??? Complete Physical     1. Have you been to the ER, urgent care clinic since your last visit?  Hospitalized since your last visit?No    2. Have you seen or consulted any other health care providers outside of the [facility] since your last visit?  Include any pap smears or colon screening. No       Health Maintenance  ?? Diet:   ?? What do you want to improve about your diet? nothing  ?? What is going well? Everything is well  ?? Any foods you do not eat at all? no  ?? Exercise:  ?? What do you want to improve about your physical activity? Would like to run more  ?? What is going well? Being active  ?? Dental screening:   ?? Brushing twice a day?yes  ?? Seeing Dentist every 6 months? Every 4months  ?? Vision screening:   ?? Glasses or contacts? Glasses for reading only  ?? Seeing eye doctor at least every 2 years? yes
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
## 2016-01-01 LAB — CBC WITH AUTOMATED DIFF
ABS. BASOPHILS: 0 10*3/uL (ref 0.0–0.2)
ABS. EOSINOPHILS: 0 10*3/uL (ref 0.0–0.4)
ABS. IMM. GRANS.: 0 10*3/uL (ref 0.0–0.1)
ABS. MONOCYTES: 0.3 10*3/uL (ref 0.1–0.9)
ABS. NEUTROPHILS: 1.7 10*3/uL (ref 1.4–7.0)
Abs Lymphocytes: 1.3 10*3/uL (ref 0.7–3.1)
BASOPHILS: 0 %
EOSINOPHILS: 1 %
HCT: 34.4 % (ref 34.0–46.6)
HGB: 11.5 g/dL (ref 11.1–15.9)
IMMATURE GRANULOCYTES: 0 %
Lymphocytes: 39 %
MCH: 30.8 pg (ref 26.6–33.0)
MCHC: 33.4 g/dL (ref 31.5–35.7)
MCV: 92 fL (ref 79–97)
MONOCYTES: 8 %
NEUTROPHILS: 52 %
PLATELET: 309 10*3/uL (ref 150–379)
RBC: 3.73 x10E6/uL — ABNORMAL LOW (ref 3.77–5.28)
RDW: 14.9 % (ref 12.3–15.4)
WBC: 3.3 10*3/uL — ABNORMAL LOW (ref 3.4–10.8)

## 2016-01-01 LAB — HIV 1/2 AG/AB, 4TH GENERATION,W RFLX CONFIRM: HIV SCREEN 4TH GENERATION WRFX: NONREACTIVE

## 2016-01-01 LAB — HIV 1/2 ANTIGEN/ANTIBODY, FOURTH GENERATION W/RFL: HIV Screen 4th Generation wRfx: NONREACTIVE

## 2016-01-22 ENCOUNTER — Encounter

## 2016-01-22 NOTE — Telephone Encounter (Signed)
PCP: Barrington Ellisonlaire W. Maisie Fushomas, MD    Last appt: 12/31/2015  No future appointments.    Requested Prescriptions     Pending Prescriptions Disp Refills   ??? valACYclovir (VALTREX) 1 gram tablet 90 Tab 3     Sig: Take 1 Tab by mouth daily.   ??? amLODIPine (NORVASC) 5 mg tablet 90 Tab 3     Sig: TAKE 1 TABLET BY MOUTH EVERY DAY  Indications: hypertension   ??? indapamide (LOZOL) 1.25 mg tablet 90 Tab 3     Sig: TAKE 1 TABLET BY MOUTH DAILY  Indications: hypertension       Request for a 30 or 90 day supply? 90     Pharmacy: Pharmacy listed in chart     Other Comments:

## 2016-01-25 MED ORDER — AMLODIPINE 5 MG TAB
5 mg | ORAL_TABLET | ORAL | 3 refills | Status: DC
Start: 2016-01-25 — End: 2016-12-14

## 2016-01-25 MED ORDER — INDAPAMIDE 1.25 MG TAB
1.25 mg | ORAL_TABLET | ORAL | 3 refills | Status: DC
Start: 2016-01-25 — End: 2016-12-14

## 2016-01-25 MED ORDER — VALACYCLOVIR 1 G TAB
1 gram | ORAL_TABLET | Freq: Every day | ORAL | 3 refills | Status: DC
Start: 2016-01-25 — End: 2016-12-14

## 2016-05-02 ENCOUNTER — Inpatient Hospital Stay
Admit: 2016-05-02 | Discharge: 2016-05-02 | Disposition: A | Discharge status: Home / Self Care | Payer: PRIVATE HEALTH INSURANCE | Attending: Family Medicine

## 2016-05-02 DIAGNOSIS — J111 Influenza due to unidentified influenza virus with other respiratory manifestations: Secondary | ICD-10-CM

## 2016-05-02 LAB — POC INFLUENZA A & B SCREEN
Influenza A Ag (POC): NEGATIVE
Influenza B Ag (POC): NEGATIVE

## 2016-05-02 MED ORDER — OSELTAMIVIR PHOSPHATE 75 MG CAP
75 mg | ORAL_CAPSULE | Freq: Two times a day (BID) | ORAL | 0 refills | Status: AC
Start: 2016-05-02 — End: 2016-05-07

## 2016-05-02 NOTE — Other (Signed)
HPI Comments:   Michelle Benitez is a 46 y.o. female who present complaining of flu-like symptoms: fevers, chills, myalgias, dry cough, nasal congestion, runny nose. Symptom onset 2-3 days ago without any preceding illness. Has tried OTC cold meds without resolution. No aggravating or alleviating factors. Symptoms are constant and overall worsening. Promotes no decrease in PO intake of fluids. Denies: severe lethargy, SOB, syncope/near syncope, vomiting/diarrhea, chest pain, chest pain with breathing, labored breathing, severe headache, non-intractable fevers .  Hx significant for: HTN, Anemia, Anxiety    Patient is a 46 y.o. female presenting with cough.   Cough   Associated symptoms include chills, rhinorrhea and myalgias. Pertinent negatives include no chest pain, no sore throat, no shortness of breath, no wheezing, no nausea, no vomiting and no confusion.        Past Medical History:   Diagnosis Date   ??? Anemia     Last CBC: h/h 10.1/29.8 (10/2008)   ??? Genital herpes    ??? Headache    ??? History of loop electrical excision procedure (LEEP)    ??? 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 Repeat Pap in 1 year   ??? Hypertension    ??? Vitamin D deficiency 10/14/2013        Past Surgical History:   Procedure Laterality Date   ??? HX CESAREAN SECTION  1995, 2012    x2   ??? HX DILATION AND CURETTAGE  04/14/14   ??? HX LEEP PROCEDURE  02/2013    with colposcopy   ??? HX OTHER SURGICAL Right 2007, 02/2013    Eye surgery, ptyergium removal   ??? HX TUBAL LIGATION  01/2011         Family History   Problem Relation Age of Onset   ??? Hypertension Mother    ??? Cancer Maternal Grandmother      cervical?   ??? Cancer Maternal Aunt      cervical?   ??? No Known Problems Father    ??? Hypertension Sister    ??? No Known Problems Brother    ??? No Known Problems Maternal Grandfather    ??? Diabetes Paternal Grandmother    ??? No Known Problems Paternal Grandfather    ??? No Known Problems Brother     ??? No Known Problems Sister    ??? No Known Problems Sister    ??? No Known Problems Son    ??? No Known Problems Son    ??? No Known Problems Daughter    ??? Breast Cancer Neg Hx         Social History     Social History   ??? Marital status: SINGLE     Spouse name: N/A   ??? Number of children: 3   ??? Years of education: N/A     Occupational History   ??? teacher      [employer]   ???  Student Part Time     edu doc, PhD   ??? independent business owner      sales products, in partnership with [employer]      Social History Main Topics   ??? Smoking status: Never Smoker   ??? Smokeless tobacco: Never Used   ??? Alcohol use 0.0 oz/week     0 Standard drinks or equivalent per week      Comment: glasses of wine occasional, less than once a month   ??? Drug use: No   ???  Sexual activity: Yes     Partners: Male     Birth control/ protection: None, Surgical      Comment: monogamous with partner since about 2014     Other Topics Concern   ??? Not on file     Social History Narrative    Lives one son, one dtr (after [facility]), mother    One son at [employer]                ALLERGIES: Influenza virus vaccine, specific and Lisinopril    Review of Systems   Constitutional: Positive for chills. Negative for appetite change and fatigue.   HENT: Positive for rhinorrhea. Negative for sinus pressure and sore throat.    Eyes: Negative for photophobia.   Respiratory: Positive for cough. Negative for chest tightness, shortness of breath, wheezing and stridor.    Cardiovascular: Negative for chest pain, palpitations and leg swelling.   Gastrointestinal: Negative for abdominal distention, diarrhea, nausea and vomiting.   Genitourinary: Negative for dysuria.   Musculoskeletal: Positive for myalgias. Negative for gait problem, neck pain and neck stiffness.   Skin: Negative for pallor and rash.   Neurological: Negative for dizziness and syncope.   Psychiatric/Behavioral: Negative for confusion.       Vitals:    05/02/16 1418   BP: (!) 155/94   Pulse: 83   Resp: 16    Temp: 97.1 ??F (36.2 ??C)   SpO2: 99%   Weight: 83 kg (183 lb)   Height: 5\' 7"  (1.702 m)       Physical Exam   Constitutional: She is oriented to person, place, and time. No distress.   Non-toxic appearing, well hydrated   HENT:   Mouth/Throat: No oropharyngeal exudate.     TMs normal appearing bilat  Erythematous nasal turbinates with clear rhinorrhea  OP mild erythema without swelling or exudate. Uvula midline. No oral lesions.   Eyes: Conjunctivae and EOM are normal. Pupils are equal, round, and reactive to light. No scleral icterus.   Neck: Normal range of motion. Neck supple.   Cardiovascular: Normal rate, regular rhythm and normal heart sounds.  Exam reveals no gallop and no friction rub.    No murmur heard.  Pulmonary/Chest: Effort normal and breath sounds normal. No respiratory distress. She has no wheezes. She has no rales.   Good air movement throughout. No diminished breath sounds   Lymphadenopathy:     She has no cervical adenopathy.   Neurological: She is alert and oriented to person, place, and time. No cranial nerve deficit.   Skin: Skin is warm and dry. No rash noted. She is not diaphoretic. No erythema. No pallor.   Psychiatric: She has a normal mood and affect. Her behavior is normal. Thought content normal.   Nursing note and vitals reviewed.      MDM     Differential Diagnosis; Clinical Impression; Plan:       CLINICAL IMPRESSION:  (J11.1) Influenza  (primary encounter diagnosis)    Orders Placed This Encounter      POC INFLUENZA A & B SCREEN  RX      oseltamivir (TAMIFLU) 75 mg capsule      Plan:    1. See above orders. Informed decision to initiate Tamiflu  2. Review provided handouts  3. Maintain adequate fluid intake and try humidified air at night  4. May use OTC fever reducers PRN as directed, for general aches, pain or fever; check active ingredients to ensure you are not duplicating.  We have reviewed concerning signs/symptoms, normal vs abnormal progression  of medical condition and when and where to seek immediate medical attention   ED or Urgent Care immediately for worsening or new symptoms.  See your PCP if there is not at least some improvement in symptoms within the next 7 days.    Risk of Significant Complications, Morbidity, and/or Mortality:   Presenting problems:  Low  Diagnostic procedures:  Low  Management options:  Low  Progress:   Patient progress:  Stable      Procedures

## 2016-05-10 ENCOUNTER — Ambulatory Visit
Admit: 2016-05-10 | Discharge: 2016-05-10 | Payer: PRIVATE HEALTH INSURANCE | Attending: Family Medicine | Primary: Family

## 2016-05-10 DIAGNOSIS — F320 Major depressive disorder, single episode, mild: Secondary | ICD-10-CM

## 2016-05-10 MED ORDER — ESCITALOPRAM 5 MG TAB
5 mg | ORAL_TABLET | Freq: Every day | ORAL | 3 refills | Status: DC
Start: 2016-05-10 — End: 2016-09-01

## 2016-05-10 NOTE — Patient Instructions (Signed)
.  TODAY, please go to:   CHECK OUT      Please schedule the following appointments at CHECK OUT:  ?? Mood follow up with Dr. Maisie Fushomas in 2 weeks       Today's Plan:  - start lexapro  - go to or call [facility] for an appointment      ?? Go to [email or web address]  ?? Enter your zip code.  ?? Click on your insurance carrier (usually on left side of screen).  ?? Then click any other parameters you desire.     This will result in a list of providers. Click on any provider to learn more about them or see the contact information. Please choose a provider, call them, and schedule an appointment.     Consider MINDFULNESS, visit:  [email or web address]  Find a book or resource that works for you. One to consider is Charlene BrookeJon Kabat Zinn Full Catastrophe Living.

## 2016-05-10 NOTE — Progress Notes (Signed)
Marland Kitchen  [facility]  Clinic Visit   05/10/2016  Patient ID: Michelle Benitez is a 46 y.o. female.  Assessment/Plan:    Diagnoses and all orders for this visit:    1. Mild single current episode of major depressive disorder (HCC)  -     escitalopram oxalate (LEXAPRO) 5 mg tablet; Take 2 Tabs by mouth daily. For the first week, take 1 tablet by mouth daily    new dx of MDD. Recommend medication and counseling. Support provided.     Counselled pt on Patient health concerns and plans.  Patient was offered a choice/choices in the treatment plan today.   Reviewed return precautions as appropriate.   Patient expresses understanding of the plan and agrees with recommendations.  See patient instructions for more.      Patient Instructions   .TODAY, please go to:   CHECK OUT      Please schedule the following appointments at CHECK OUT:  ?? Mood follow up with Dr. Maisie Fus in 2 weeks       Today's Plan:  - start lexapro  - go to or call [facility] for an appointment      ?? Go to [email or web address]  ?? Enter your zip code.  ?? Click on your insurance carrier (usually on left side of screen).  ?? Then click any other parameters you desire.     This will result in a list of providers. Click on any provider to learn more about them or see the contact information. Please choose a provider, call them, and schedule an appointment.     Consider MINDFULNESS, visit:  PodcastStats.co.uk  Find a book or resource that works for you. One to consider is Charlene Brooke Full Catastrophe Living.          Subjective:   HPI:  Michelle Benitez is a 46 y.o. female being seen for:   Chief Complaint   Patient presents with   ??? Fatigue   ??? Decreased Appetite   ??? Other     stated that she just doesn't feel well.        Negative test for flu, but provider suspected may still be the flu, about 1 week ago. Treated with tamiflu      ?? Prior to flu diagnosis, feeling stressed.    ?? Since last week feeling tired, fatigue, dizzy, no appetite.   ?? Feeling weak in body and weak in spirit  ?? Having a low feeling, started before the flu, worse last week  ?? More emotional through the week, crying and not sure why.   ?? Considering seeing a counselor.   ?? A professor at [employer], recommended  ?? Since a poor grade this past summer, wakes with anxiety attacks thinking she is not going to graduate. Will stay up the rest of the night trying to do extra work.   ?? Around November, went to conference with sister, felt on the edge, not her usual even temper. Got mad, asked her sister to let her out [place], got out, walked a mile, then got back in.   ?? Around [place] and thanksgiving was mad at everyone.   ?? Since then, has calmed some  ?? Family is good. Relationship is great. Often  ?? Sleep- panic attacks interrupt sleep about 3 times per month  ?? Interest- less active in last few years because of school, baseline  ?? Guilt- not sure, but sometimes feels incompetent  ??  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   ?? Sometimes seconds guess decision to get degree  ?? Denies SI or s/sx mania     Review of Systems  Otherwise as noted in HPI  ?  Objective:     Visit Vitals   ??? BP 126/84 (BP 1 Location: Left arm, BP Patient Position: Sitting)   ??? Pulse 77   ??? Temp 98.3 ??F (36.8 ??C) (Oral)   ??? Resp 18   ??? Ht 5\' 7"  (1.702 m)   ??? Wt 177 lb (80.3 kg)   ??? SpO2 100%   ??? BMI 27.72 kg/m2     Wt Readings from Last 3 Encounters:   05/10/16 177 lb (80.3 kg)   05/02/16 183 lb (83 kg)   12/31/15 176 lb 9.6 oz (80.1 kg)     BP Readings from Last 3 Encounters:   05/10/16 126/84   05/02/16 (!) 155/94   12/31/15 122/84     PHQ over the last two weeks 05/10/2016   Little interest or pleasure in doing things Several days   Feeling down, depressed or hopeless Several days   Total Score PHQ 2 2         Physical Exam   Constitutional: She appears well-developed and well-nourished. No distress.    Pulmonary/Chest: Effort normal. No respiratory distress.   Neurological: She is alert.   Psychiatric: Her behavior is normal. She exhibits a depressed mood.   cries       Allergies   Allergen Reactions   ??? Influenza Virus Vaccine, Specific Swelling     Happened 2 years in a row.   ??? Lisinopril Angioedema     Lip swelling on 07/10/13 while taking lisinopril-HCTZ     Prior to Admission medications    Medication Sig Start Date End Date Taking? Authorizing Provider   escitalopram oxalate (LEXAPRO) 5 mg tablet Take 2 Tabs by mouth daily. For the first week, take 1 tablet by mouth daily 05/10/16  Yes Barrington EllisonClaire W. Maisie Fushomas, MD   valACYclovir (VALTREX) 1 gram tablet Take 1 Tab by mouth daily. 01/25/16  Yes Barrington Ellisonlaire W. Maisie Fushomas, MD   amLODIPine (NORVASC) 5 mg tablet TAKE 1 TABLET BY MOUTH EVERY DAY  Indications: hypertension 01/25/16  Yes Barrington Ellisonlaire W. Maisie Fushomas, MD   indapamide (LOZOL) 1.25 mg tablet TAKE 1 TABLET BY MOUTH DAILY  Indications: hypertension 01/25/16  Yes Barrington Ellisonlaire W. Maisie Fushomas, MD   multivitamin (ONE A DAY) tablet Take 1 Tab by mouth daily.   Yes Historical Provider   solifenacin (VESICARE) 5 mg tablet Take 1 Tab by mouth daily. 11/10/15   Barrington Ellisonlaire W. Maisie Fushomas, MD   butalbital-acetaminophen-caffeine (FIORICET, ESGIC) 50-325-40 mg per tablet Take 1 Tab by mouth daily as needed (severe headache). 11/10/15   Barrington Ellisonlaire W. Maisie Fushomas, MD     Patient Active Problem List   Diagnosis Code   ??? Hypertension I10   ??? Vitamin D deficiency E55.9   ??? Urinary frequency R35.0   ??? Menorrhagia with irregular cycle N92.1   ??? Anemia D64.9   ??? 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 Anxiety F41.9

## 2016-05-10 NOTE — Progress Notes (Signed)
Chief Complaint   Patient presents with   ??? Fatigue   ??? Decreased Appetite   ??? Other     stated that she just doesn't feel well.      1. Have you been to the ER, urgent care clinic since your last visit?  Hospitalized since your last visit? Yes, ER for flu like symptoms     2. Have you seen or consulted any other health care providers outside of the [facility] since your last visit?  Include any pap smears or colon screening. No     The patient was counseled on the dangers of tobacco use, and was advised never to start.  Reviewed strategies to maximize success, including never to start.      Janese BanksKareen Sedeno  Identified pt with two pt identifiers(name and DOB).  Chief Complaint   Patient presents with   ??? Fatigue   ??? Decreased Appetite   ??? Other     stated that she just doesn't feel well.        1. Have you been to the ER, urgent care clinic since your last visit?  Hospitalized since your last visit? NO    2. Have you seen or consulted any other health care providers outside of the [facility] since your last visit?  Include any pap smears or colon screening. NO    Today's provider has been notified of reason for visit, vitals and flowsheets obtained on patients.     Patient received paperwork for advance directive during previous visit but has not completed at this time     Reviewed record In preparation for visit, huddled with provider and have obtained necessary documentation      Health Maintenance Due   Topic   ??? Pneumococcal 19-64 Highest Risk (1 of 3 - PCV13) Discussed with patient today and advised to follow up.          Wt Readings from Last 3 Encounters:   05/02/16 183 lb (83 kg)   12/31/15 176 lb 9.6 oz (80.1 kg)   11/10/15 183 lb (83 kg)     Temp Readings from Last 3 Encounters:   05/02/16 97.1 ??F (36.2 ??C)   12/31/15 97.8 ??F (36.6 ??C) (Oral)   11/10/15 97.6 ??F (36.4 ??C) (Oral)     BP Readings from Last 3 Encounters:   05/02/16 (!) 155/94   12/31/15 122/84   11/10/15 136/84      Pulse Readings from Last 3 Encounters:   05/02/16 83   12/31/15 73   11/10/15 73     There were no vitals filed for this visit.      Learning Assessment:  :     Learning Assessment 09/05/2013   PRIMARY LEARNER Patient   HIGHEST LEVEL OF EDUCATION - PRIMARY LEARNER  > 4 YEARS OF COLLEGE   BARRIERS PRIMARY LEARNER NONE   CO-LEARNER CAREGIVER No   CO-LEARNER NAME n/a   PRIMARY LANGUAGE ENGLISH   LEARNER PREFERENCE PRIMARY READING   ANSWERED BY patient   RELATIONSHIP SELF       Depression Screening:  :     PHQ over the last two weeks 05/10/2016   Little interest or pleasure in doing things Several days   Feeling down, depressed or hopeless Several days   Total Score PHQ 2 2       Fall Risk Assessment:  :     No flowsheet data found.    Abuse Screening:  :  No flowsheet data found.    ADL Screening:  :     No flowsheet data found.        ACP is not on file, advised to return.       Medication reconciliation up to date and corrected with patient at this time.

## 2016-05-24 ENCOUNTER — Ambulatory Visit
Admit: 2016-05-24 | Discharge: 2016-05-24 | Payer: PRIVATE HEALTH INSURANCE | Attending: Family Medicine | Primary: Family

## 2016-05-24 DIAGNOSIS — F320 Major depressive disorder, single episode, mild: Secondary | ICD-10-CM

## 2016-05-24 MED ORDER — VENLAFAXINE SR 37.5 MG 24 HR CAP
37.5 mg | ORAL_CAPSULE | ORAL | 4 refills | Status: DC
Start: 2016-05-24 — End: 2016-09-01

## 2016-05-24 NOTE — Progress Notes (Signed)
[facility]  Clinic Visit   05/24/2016  Patient ID: Michelle BanksKareen Benitez is a 46 y.o. female.  Assessment/Plan:    Diagnoses and all orders for this visit:    1. Mild single current episode of major depressive disorder (HCC)  -     venlafaxine-SR (EFFEXOR-XR) 37.5 mg capsule; Start with 1 tablet once a day for 1 week, then increase to 2 tablets a day. Continue at this dose.    2. Anxiety  -     venlafaxine-SR (EFFEXOR-XR) 37.5 mg capsule; Start with 1 tablet once a day for 1 week, then increase to 2 tablets a day. Continue at this dose.  stop lexapro d/t insomnia    Counselled pt on Patient health concerns and plans.  Patient was offered a choice/choices in the treatment plan today.   Reviewed return precautions as appropriate.   Patient expresses understanding of the plan and agrees with recommendations.  See patient instructions for more.      Patient Instructions   .TODAY, please go to:   CHECK OUT     Please schedule the following appointments at CHECK OUT:  ?? Medication follow up with Dr. Maisie Fushomas in 2 weeks     Today's Plan:  - take new medication effexor        Subjective:   HPI:  Michelle Benitez is a 46 y.o. female being seen for:   Chief Complaint   Patient presents with   ??? Follow-up     ??  started lexapro. Had some insomnia for a few days, was tired, but could not sleep. Continued medication. By Monday night, was able to go to sleep right away, but would wake at 3am. This has continued   ?? Then she increased dose as advised after a week.   ?? [facility], had an appointment this AM. Will go back on 05/30/16  ?? Still working on school work  ?? Work is challenging- teaching    Screening and Prevention Due:  Health Maintenance Due   Topic Date Due   ??? Pneumococcal 19-64 Highest Risk (1 of 3 - PCV13) [birth year]1990         Review of Systems  Otherwise as noted in HPI  ?  Objective:     Visit Vitals   ??? BP 130/80 (BP 1 Location: Left arm, BP Patient Position: Sitting)   ??? Pulse 68    ??? Temp 98.2 ??F (36.8 ??C) (Oral)   ??? Resp 16   ??? Ht 5\' 7"  (1.702 m)   ??? Wt 178 lb 3.2 oz (80.8 kg)   ??? SpO2 97%   ??? BMI 27.91 kg/m2       Physical Exam   Constitutional: She appears well-developed and well-nourished. No distress.   Pulmonary/Chest: Effort normal. No respiratory distress.   Neurological: She is alert.   Psychiatric: She has a normal mood and affect. Her behavior is normal.       Allergies   Allergen Reactions   ??? Influenza Virus Vaccine, Specific Swelling     Happened 2 years in a row.   ??? Lexapro [Escitalopram] Other (comments)     insomnia   ??? Lisinopril Angioedema     Lip swelling on 07/10/13 while taking lisinopril-HCTZ     Prior to Admission medications    Medication Sig Start Date End Date Taking? Authorizing Provider   venlafaxine-SR [facility](EFFEXOR-XR) 37.5 mg capsule Start with 1 tablet once a day for 1 week, then increase to 2 tablets  a day. Continue at this dose. 05/24/16  Yes Barrington Ellison. Maisie Fus, MD   valACYclovir (VALTREX) 1 gram tablet Take 1 Tab by mouth daily. 01/25/16  Yes Barrington Ellison. Maisie Fus, MD   amLODIPine (NORVASC) 5 mg tablet TAKE 1 TABLET BY MOUTH EVERY DAY  Indications: hypertension 01/25/16  Yes Barrington Ellison. Maisie Fus, MD   indapamide (LOZOL) 1.25 mg tablet TAKE 1 TABLET BY MOUTH DAILY  Indications: hypertension 01/25/16  Yes Barrington Ellison. Maisie Fus, MD   multivitamin (ONE A DAY) tablet Take 1 Tab by mouth daily.   Yes Historical Provider   escitalopram oxalate (LEXAPRO) 5 mg tablet Take 2 Tabs by mouth daily. For the first week, take 1 tablet by mouth daily 05/10/16   Barrington Ellison. Maisie Fus, MD   solifenacin (VESICARE) 5 mg tablet Take 1 Tab by mouth daily. 11/10/15   Barrington Ellison. Maisie Fus, MD   butalbital-acetaminophen-caffeine (FIORICET, ESGIC) 50-325-40 mg per tablet Take 1 Tab by mouth daily as needed (severe headache). 11/10/15   Barrington Ellison. Maisie Fus, MD     Patient Active Problem List   Diagnosis Code   ??? Hypertension I10   ??? Vitamin D deficiency E55.9   ??? Urinary frequency R35.0    ??? Menorrhagia with irregular cycle N92.1   ??? Anemia D64.9   ??? 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 Anxiety F41.9

## 2016-05-24 NOTE — Progress Notes (Signed)
Michelle BanksKareen Benitez  Identified pt with two pt identifiers(name and DOB).  Chief Complaint   Patient presents with   ??? Follow-up     Patient has stopped her new mediation and wants to talk about that.      1. Have you been to the ER, urgent care clinic since your last visit?  Hospitalized since your last visit? NO    2. Have you seen or consulted any other health care providers outside of the [facility] since your last visit?  Include any pap smears or colon screening. NO    Today's provider has been notified of reason for visit, vitals and flowsheets obtained on patients.     Patient received paperwork for advance directive during previous visit but has not completed at this time     Reviewed record In preparation for visit, huddled with provider and have obtained necessary documentation      Health Maintenance Due   Topic   ??? Pneumococcal 19-64 Highest Risk (1 of 3 - PCV13)       Wt Readings from Last 3 Encounters:   05/24/16 178 lb 3.2 oz (80.8 kg)   05/10/16 177 lb (80.3 kg)   05/02/16 183 lb (83 kg)     Temp Readings from Last 3 Encounters:   05/24/16 98.2 ??F (36.8 ??C) (Oral)   05/10/16 98.3 ??F (36.8 ??C) (Oral)   05/02/16 97.1 ??F (36.2 ??C)     BP Readings from Last 3 Encounters:   05/24/16 130/80   05/10/16 126/84   05/02/16 (!) 155/94     Pulse Readings from Last 3 Encounters:   05/24/16 68   05/10/16 77   05/02/16 83     Vitals:    05/24/16 1731   BP: 130/80   Pulse: 68   Resp: 16   Temp: 98.2 ??F (36.8 ??C)   TempSrc: Oral   SpO2: 97%   Weight: 178 lb 3.2 oz (80.8 kg)   Height: 5\' 7"  (1.702 m)   PainSc:   0 - No pain         Learning Assessment:  :     Learning Assessment 09/05/2013   PRIMARY LEARNER Patient   HIGHEST LEVEL OF EDUCATION - PRIMARY LEARNER  > 4 YEARS OF COLLEGE   BARRIERS PRIMARY LEARNER NONE   CO-LEARNER CAREGIVER No   CO-LEARNER NAME n/a   PRIMARY LANGUAGE ENGLISH   LEARNER PREFERENCE PRIMARY READING   ANSWERED BY patient   RELATIONSHIP SELF       Depression Screening:  :      PHQ over the last two weeks 05/10/2016   Little interest or pleasure in doing things Several days   Feeling down, depressed or hopeless Several days   Total Score PHQ 2 2       Fall Risk Assessment:  :     No flowsheet data found.    Abuse Screening:  :     Abuse Screening Questionnaire 05/24/2016   Do you ever feel afraid of your partner? N   Are you in a relationship with someone who physically or mentally threatens you? N   Is it safe for you to go home? Y       ADL Screening:  :     ADL Assessment 05/24/2016   Feeding yourself No Help Needed   Getting from bed to chair No Help Needed   Getting dressed No Help Needed   Bathing or showering No Help Needed  Walk across the room (includes cane/walker) No Help Needed   Using the telphone No Help Needed   Taking your medications No Help Needed   Preparing meals No Help Needed   Managing money (expenses/bills) No Help Needed   Moderately strenuous housework (laundry) No Help Needed   Shopping for personal items (toiletries/medicines) No Help Needed   Shopping for groceries No Help Needed   Driving No Help Needed   Climbing a flight of stairs No Help Needed   Getting to places beyond walking distances No Help Needed                 Medication reconciliation up to date and corrected with patient at this time.

## 2016-05-24 NOTE — Patient Instructions (Signed)
.  TODAY, please go to:   CHECK OUT     Please schedule the following appointments at CHECK OUT:  ?? Medication follow up with Dr. Maisie Fushomas in 2 weeks     Today's Plan:  - take new medication effexor

## 2016-06-30 NOTE — Progress Notes (Signed)
Almost back to normal. Check again with next labs.

## 2016-09-01 ENCOUNTER — Ambulatory Visit
Admit: 2016-09-01 | Discharge: 2016-09-01 | Payer: PRIVATE HEALTH INSURANCE | Attending: Family Medicine | Primary: Family

## 2016-09-01 DIAGNOSIS — F419 Anxiety disorder, unspecified: Secondary | ICD-10-CM

## 2016-09-01 NOTE — Progress Notes (Signed)
Chief Complaint   Patient presents with   ??? Follow-up     Janese BanksKareen Sollers  Identified pt with two pt identifiers(name and DOB).  Chief Complaint   Patient presents with   ??? Follow-up       1. Have you been to the ER, urgent care clinic since your last visit? N Hospitalized since your last visit? No    2. Have you seen or consulted any other health care providers outside of the [facility] since your last visit?  Include any pap smears or colon screening. Yes/No    Today's provider has been notified of reason for visit, vitals and flowsheets obtained on patients.     Patient received paperwork for advance directive during previous visit but has not completed at this time     Reviewed record In preparation for visit, huddled with provider and have obtained necessary documentation      Health Maintenance Due   Topic   ??? Pneumococcal 19-64 Highest Risk (1 of 3 - PCV13)   ??? PAP AKA CERVICAL CYTOLOGY        Wt Readings from Last 3 Encounters:   09/01/16 184 lb 8 oz (83.7 kg)   05/24/16 178 lb 3.2 oz (80.8 kg)   05/10/16 177 lb (80.3 kg)     Temp Readings from Last 3 Encounters:   09/01/16 96.7 ??F (35.9 ??C) (Oral)   05/24/16 98.2 ??F (36.8 ??C) (Oral)   05/10/16 98.3 ??F (36.8 ??C) (Oral)     BP Readings from Last 3 Encounters:   09/01/16 139/89   05/24/16 130/80   05/10/16 126/84     Pulse Readings from Last 3 Encounters:   09/01/16 76   05/24/16 68   05/10/16 77     Vitals:    09/01/16 1649   BP: 139/89   Pulse: 76   Resp: 18   Temp: 96.7 ??F (35.9 ??C)   TempSrc: Oral   SpO2: 99%   Weight: 184 lb 8 oz (83.7 kg)   Height: 5\' 7"  (1.702 m)   PainSc:   0 - No pain         Learning Assessment:  :     Learning Assessment 09/05/2013   PRIMARY LEARNER Patient   HIGHEST LEVEL OF EDUCATION - PRIMARY LEARNER  > 4 YEARS OF COLLEGE   BARRIERS PRIMARY LEARNER NONE   CO-LEARNER CAREGIVER No   CO-LEARNER NAME n/a   PRIMARY LANGUAGE ENGLISH   LEARNER PREFERENCE PRIMARY READING   ANSWERED BY patient   RELATIONSHIP SELF        Depression Screening:  :     PHQ over the last two weeks 09/01/2016   Little interest or pleasure in doing things Several days   Feeling down, depressed or hopeless Several days   Total Score PHQ 2 2   Trouble falling or staying asleep, or sleeping too much More than half the days   Feeling tired or having little energy Several days   Poor appetite or overeating Not at all   Feeling bad about yourself - or that you are a failure or have let yourself or your family down Several days   Trouble concentrating on things such as school, work, reading or watching TV Several days   Moving or speaking so slowly that other people could have noticed; or the opposite being so fidgety that others notice Several days   Thoughts of being better off dead, or hurting yourself in some way Not  at all   PHQ 9 Score 8   How difficult have these problems made it for you to do your work, take care of your home and get along with others Not difficult at all       Fall Risk Assessment:  :     No flowsheet data found.    Abuse Screening:  :     Abuse Screening Questionnaire 05/24/2016   Do you ever feel afraid of your partner? N   Are you in a relationship with someone who physically or mentally threatens you? N   Is it safe for you to go home? Y       ADL Screening:  :     ADL Assessment 05/24/2016   Feeding yourself No Help Needed   Getting from bed to chair No Help Needed   Getting dressed No Help Needed   Bathing or showering No Help Needed   Walk across the room (includes cane/walker) No Help Needed   Using the telphone No Help Needed   Taking your medications No Help Needed   Preparing meals No Help Needed   Managing money (expenses/bills) No Help Needed   Moderately strenuous housework (laundry) No Help Needed   Shopping for personal items (toiletries/medicines) No Help Needed   Shopping for groceries No Help Needed   Driving No Help Needed   Climbing a flight of stairs No Help Needed    Getting to places beyond walking distances No Help Needed                 Medication reconciliation up to date and corrected with patient at this time.

## 2016-09-01 NOTE — Progress Notes (Signed)
[facility]  Cl[facility] Visit   09/01/2016  Patient ID: Michelle BanksKareen Benitez is a 46 y.o. female.  Assessment/Plan:    Diagnoses and all orders for this visit:    1. Anxiety    2. Plantar fasciitis, bilateral    3. Varicosities of leg    4. Venous insufficiency of both lower extremities    Establish with new counselor. Continue w/o medication for now    Suspect early plantar fasciitis     See patient instructions for more.  Counselled pt on Patient health concerns and plans.  Patient was offered a choice/choices in the treatment plan today.   Reviewed return precautions as appropriate.   Patient expresses understanding of the plan and agrees with recommendations.    Patient Instructions   TODAY, please go to:     CHECK OUT - If you received a referral, Show the front desk       Please schedule the following appointments at CHECK OUT:  Complete Physical Exam  with Sharon SellerKelley, Angie, or Dr. Gaylyn Lambertead in October 2018     Today's Plan:    For legs: compression, elevation    For feet: shoe inserts, avoid being barefoot or wearing very flat shoes. Massage bottom of foot. Ice.            Plantar Fasciitis: Care Instructions  Your Care Instructions    Plantar fasciitis is pain and inflammation of the plantar fascia, the tissue at the bottom of your foot that connects the heel bone to the toes. The plantar fascia also supports the arch. If you strain the plantar fascia, it can develop small tears and cause heel pain when you stand or walk.  Plantar fasciitis can be caused by running or other sports. It also may occur in people who are overweight or who have high arches or flat feet. You may get plantar fasciitis if you walk or stand for long periods, or have a tight Achilles tendon or calf muscles.  You can improve your foot pain with rest and other care at home. It might take a few weeks to a few months for your foot to heal completely.  Follow-up care is a key part of your treatment and safety. Be sure to make  and go to all appointments, and call your doctor if you are having problems. It's also a good idea to know your test results and keep a list of the medicines you take.  How can you care for yourself at home?  ?? Rest your feet often. Reduce your activity to a level that lets you avoid pain. If possible, do not run or walk on hard surfaces.  ?? Take pain medicines exactly as directed.  ?? If the doctor gave you a prescription medicine for pain, take it as prescribed.  ?? If you are not taking a prescription pain medicine, take an over-the-counter anti-inflammatory medicine for pain and swelling, such as ibuprofen (Advil, Motrin) or naproxen (Aleve). Read and follow all instructions on the label.  ?? Use ice massage to help with pain and swelling. You can use an ice cube or an ice cup several times a day. To make an ice cup, fill a paper cup with water and freeze it. Cut off the top of the cup until a half-inch of ice shows. Hold onto the remaining paper to use the cup. Rub the ice in small circles over the area for 5 to 7 minutes.  ?? Contrast baths, which alternate hot and  cold water, can also help reduce swelling. But because heat alone may make pain and swelling worse, end a contrast bath with a soak in cold water.  ?? Wear a night splint if your doctor suggests it. A night splint holds your foot with the toes pointed up and the foot and ankle at a 90-degree angle. This position gives the bottom of your foot a constant, gentle stretch.  ?? Do simple exercises such as calf stretches and towel stretches 2 to 3 times each day, especially when you first get up in the morning. These can help the plantar fascia become more flexible. They also make the muscles that support your arch stronger. Hold these stretches for 15 to 30 seconds per stretch. Repeat 2 to 4 times.  ?? Stand about 1 foot from a wall. Place the palms of both hands against the wall at chest level. Lean forward against the wall, keeping one leg  with the knee straight and heel on the ground while bending the knee of the other leg.  ?? Sit down on the floor or a mat with your feet stretched in front of you. Roll up a towel lengthwise, and loop it over the ball of your foot. Holding the towel at both ends, gently pull the towel toward you to stretch your foot.  ?? Wear shoes with good arch support. Athletic shoes or shoes with a well-cushioned sole are good choices.  ?? Try heel cups or shoe inserts (orthotics) to help cushion your heel. You can buy these at many shoe stores.  ?? Put on your shoes as soon as you get out of bed. Going barefoot or wearing slippers may make your pain worse.  ?? Reach and stay at a good weight for your height. This puts less strain on your feet.  When should you call for help?  Call your doctor now or seek immediate medical care if:  ?? You have heel pain with fever, redness, or warmth in your heel.  ?? You cannot put weight on the sore foot.  Watch closely for changes in your health, and be sure to contact your doctor if:  ?? You have numbness or tingling in your heel.  ?? Your heel pain lasts more than 2 weeks.  Where can you learn more?  Go to [employer].  Enter [phone] in the search box to learn more about "Plantar Fasciitis: Care Instructions."  Current as of: June 09, 2015  Content Version: 11.4  ?? 2006-2017 [employer]. Care instructions adapted under license by [employer] (which disclaims liability or warranty for this information). If you have questions about a medical condition or this instruction, always ask your healthcare professional. [employer], Inc[employer] disclaims any warranty or liability for your use of this information.      Subjective:   HPI:  Michelle Benitez is a 46 y.o. female being seen for:   Chief Complaint   Patient presents with   ??? Follow-up     ??  graduated!  ?? Had insomnia and balance issues with lexapro   ?? Tried effexor, stopped after a week    ?? Went to counseling through [employer]   ?? Had to stop before graduating. Got some references. Has not found anyone she is comfortable with.   ?? Not sure what she wants to do yet. So thinking about next steps.       Leg symptoms  ?? Feels warmth  ?? Ankle got dark one day  ?? Foot pain  when she stands to walk. Even after taking a break from exercising   ?? Not tender  ?? Some swelling.   ?? No calf pain with walking  ?? Sometimes paresthesia. Not sure how much.     Screening and Prevention Due:  Health Maintenance Due   Topic Date Due   ??? Pneumococcal 19-64 Highest Risk (1 of 3 - PCV13) 05/15/1990   ??? PAP AKA CERVICAL CYTOLOGY  10/11/2016         Review of Systems  Otherwise as noted in HPI    Social History     Social History Narrative    Lives one son, one dtr (after [employer]), mother    One son at [employer] at [place] in 2018 in education.      History   Smoking Status   ??? Never Smoker   Smokeless Tobacco   ??? Never Used         Objective:     Visit Vitals   ??? BP 139/89 (BP 1 Location: Left arm, BP Patient Position: Sitting)   ??? Pulse 76   ??? Temp 96.7 ??F (35.9 ??C) (Oral)   ??? Resp 18   ??? Ht 5\' 7"  (1.702 m)   ??? Wt 184 lb 8 oz (83.7 kg)   ??? SpO2 99%   ??? BMI 28.9 kg/m2       BP Readings from Last 3 Encounters:   09/01/16 139/89   05/24/16 130/80   05/10/16 126/84       Wt Readings from Last 3 Encounters:   09/01/16 184 lb 8 oz (83.7 kg)   05/24/16 178 lb 3.2 oz (80.8 kg)   05/10/16 177 lb (80.3 kg)       Physical Exam   Constitutional: She appears well-developed and well-nourished. No distress.   Pulmonary/Chest: Effort normal. No respiratory distress.   Musculoskeletal:   Varicosities on b/l legs    No ttp- b/l plantar feet   Neurological: She is alert.   Psychiatric: She has a normal mood and affect. Her behavior is normal.         Allergies   Allergen Reactions   ??? Influenza Virus Vaccine, Specific Swelling     Happened 2 years in a row.   ??? Lexapro [Escitalopram] Other (comments)     insomnia   ??? Lisinopril Angioedema      Lip swelling on 07/10/13 while taking lisinopril-HCTZ     Prior to Admission medications    Medication Sig Start Date End Date Taking? Authorizing Provider   valACYclovir (VALTREX) 1 gram tablet Take 1 Tab by mouth daily. 01/25/16  Yes Barrington Ellison. Maisie Fus, MD   amLODIPine (NORVASC) 5 mg tablet TAKE 1 TABLET BY MOUTH EVERY DAY  Indications: hypertension 01/25/16  Yes Barrington Ellison. Maisie Fus, MD   indapamide (LOZOL) 1.25 mg tablet TAKE 1 TABLET BY MOUTH DAILY  Indications: hypertension 01/25/16  Yes Barrington Ellison. Maisie Fus, MD   multivitamin (ONE A DAY) tablet Take 1 Tab by mouth daily.    Historical Provider   butalbital-acetaminophen-caffeine (FIORICET, ESGIC) 50-325-40 mg per tablet Take 1 Tab by mouth daily as needed (severe headache). 11/10/15   Barrington Ellison. Maisie Fus, MD     Patient Active Problem List   Diagnosis Code   ??? Hypertension I10   ??? Vitamin D deficiency E55.9   ??? Urinary frequency R35.0   ??? Menorrhagia with irregular cycle N92.1   ??? Anemia D64.9   ???  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 Anxiety F41.9

## 2016-09-01 NOTE — Progress Notes (Signed)
Chief Complaint   Patient presents with   ??? Follow-up     Pt would like to discuss her feet, pt states it is painful, burning and difficult to walk after laying down.  Pt states feet become dark at times and then lighten.    1. Have you been to the ER, urgent care clinic since your last visit?  Hospitalized since your last visit?No    2. Have you seen or consulted any other health care providers outside of the [facility] since your last visit?  Include any pap smears or colon screening. No

## 2016-09-01 NOTE — Patient Instructions (Addendum)
TODAY, please go to:     CHECK OUT - If you received a referral, Show the front desk       Please schedule the following appointments at CHECK OUT:  Complete Physical Exam  with Sharon SellerKelley, Angie, or Dr. Gaylyn Lambertead in October 2018     Today's Plan:    For legs: compression, elevation    For feet: shoe inserts, avoid being barefoot or wearing very flat shoes. Massage bottom of foot. Ice.            Plantar Fasciitis: Care Instructions  Your Care Instructions    Plantar fasciitis is pain and inflammation of the plantar fascia, the tissue at the bottom of your foot that connects the heel bone to the toes. The plantar fascia also supports the arch. If you strain the plantar fascia, it can develop small tears and cause heel pain when you stand or walk.  Plantar fasciitis can be caused by running or other sports. It also may occur in people who are overweight or who have high arches or flat feet. You may get plantar fasciitis if you walk or stand for long periods, or have a tight Achilles tendon or calf muscles.  You can improve your foot pain with rest and other care at home. It might take a few weeks to a few months for your foot to heal completely.  Follow-up care is a key part of your treatment and safety. Be sure to make and go to all appointments, and call your doctor if you are having problems. It's also a good idea to know your test results and keep a list of the medicines you take.  How can you care for yourself at home?  ?? Rest your feet often. Reduce your activity to a level that lets you avoid pain. If possible, do not run or walk on hard surfaces.  ?? Take pain medicines exactly as directed.  ?? If the doctor gave you a prescription medicine for pain, take it as prescribed.  ?? If you are not taking a prescription pain medicine, take an over-the-counter anti-inflammatory medicine for pain and swelling, such as ibuprofen (Advil, Motrin) or naproxen (Aleve). Read and follow all instructions on the label.   ?? Use ice massage to help with pain and swelling. You can use an ice cube or an ice cup several times a day. To make an ice cup, fill a paper cup with water and freeze it. Cut off the top of the cup until a half-inch of ice shows. Hold onto the remaining paper to use the cup. Rub the ice in small circles over the area for 5 to 7 minutes.  ?? Contrast baths, which alternate hot and cold water, can also help reduce swelling. But because heat alone may make pain and swelling worse, end a contrast bath with a soak in cold water.  ?? Wear a night splint if your doctor suggests it. A night splint holds your foot with the toes pointed up and the foot and ankle at a 90-degree angle. This position gives the bottom of your foot a constant, gentle stretch.  ?? Do simple exercises such as calf stretches and towel stretches 2 to 3 times each day, especially when you first get up in the morning. These can help the plantar fascia become more flexible. They also make the muscles that support your arch stronger. Hold these stretches for 15 to 30 seconds per stretch. Repeat 2 to 4 times.  ?? Stand about  1 foot from a wall. Place the palms of both hands against the wall at chest level. Lean forward against the wall, keeping one leg with the knee straight and heel on the ground while bending the knee of the other leg.  ?? Sit down on the floor or a mat with your feet stretched in front of you. Roll up a towel lengthwise, and loop it over the ball of your foot. Holding the towel at both ends, gently pull the towel toward you to stretch your foot.  ?? Wear shoes with good arch support. Athletic shoes or shoes with a well-cushioned sole are good choices.  ?? Try heel cups or shoe inserts (orthotics) to help cushion your heel. You can buy these at many shoe stores.  ?? Put on your shoes as soon as you get out of bed. Going barefoot or wearing slippers may make your pain worse.   ?? Reach and stay at a good weight for your height. This puts less strain on your feet.  When should you call for help?  Call your doctor now or seek immediate medical care if:  ?? You have heel pain with fever, redness, or warmth in your heel.  ?? You cannot put weight on the sore foot.  Watch closely for changes in your health, and be sure to contact your doctor if:  ?? You have numbness or tingling in your heel.  ?? Your heel pain lasts more than 2 weeks.  Where can you learn more?  Go to [employer].  Enter [phone] in the search box to learn more about "Plantar Fasciitis: Care Instructions."  Current as of: June 09, 2015  Content Version: 11.4  ?? 2006-2017 [employer]. Care instructions adapted under license by [employer] (which disclaims liability or warranty for this information). If you have questions about a medical condition or this instruction, always ask your healthcare professional. [employer] disclaims any warranty or liability for your use of this information.

## 2016-12-10 ENCOUNTER — Encounter

## 2016-12-13 NOTE — Telephone Encounter (Signed)
PCP: Barrington Ellisonlaire W. Maisie Fushomas, MD    Last appt: 09/01/2016  Future Appointments  Date Time Provider Department Center   12/30/2016 4:00 PM Jackey LogeKelley B Shanahan, NP [facility]       Requested Prescriptions     Pending Prescriptions Disp Refills   ??? valACYclovir (VALTREX) 1 gram tablet 90 Tab 3     Sig: Take 1 Tab by mouth daily.   ??? amLODIPine (NORVASC) 5 mg tablet 90 Tab 3     Sig: TAKE 1 TABLET BY MOUTH EVERY DAY  Indications: hypertension   ??? indapamide (LOZOL) 1.25 mg tablet 90 Tab 3     Sig: TAKE 1 TABLET BY MOUTH DAILY  Indications: hypertension       Prior labs and Blood pressures:  BP Readings from Last 3 Encounters:   09/01/16 139/89   05/24/16 130/80   05/10/16 126/84     Lab Results   Component Value Date/Time    Sodium 142 12/24/2015 09:01 AM    Potassium 3.8 12/24/2015 09:01 AM    Chloride 101 12/24/2015 09:01 AM    CO2 28 12/24/2015 09:01 AM    Glucose 98 12/24/2015 09:01 AM    BUN 10 12/24/2015 09:01 AM    Creatinine 0.88 12/24/2015 09:01 AM    BUN/Creatinine ratio 11 12/24/2015 09:01 AM    GFR est AA 92 12/24/2015 09:01 AM    GFR est non-AA 80 12/24/2015 09:01 AM    Calcium 9.7 12/24/2015 09:01 AM     Lab Results   Component Value Date/Time    Hemoglobin A1c 5.2 12/24/2015 09:01 AM     Lab Results   Component Value Date/Time    Cholesterol, total 163 12/24/2015 09:01 AM    HDL Cholesterol 58 12/24/2015 09:01 AM    LDL, calculated 96 12/24/2015 09:01 AM    VLDL, calculated 9 12/24/2015 09:01 AM    Triglyceride 47 12/24/2015 09:01 AM     Lab Results   Component Value Date/Time    VITAMIN D, 25-HYDROXY 29.4 (L) 12/24/2015 09:01 AM       Lab Results   Component Value Date/Time    TSH 0.596 10/11/2013 02:35 PM

## 2016-12-13 NOTE — Telephone Encounter (Signed)
From: Michelle Benitez  To: Barrington Ellison. Maisie Fus, MD  Sent: 12/10/2016 10:49 AM EDT  Subject:  Medication Renewal Request    Original  authorizing provider: Barrington Ellison. Maisie Fus, MD    Michelle Benitez would like a refill of the following medications:  valACYclovir  (VALTREX) 1 gram tablet Barrington Ellison. Maisie Fus, MD]  amLODIPine  (NORVASC) 5 mg tablet Barrington Ellison. Maisie Fus, MD]  indapamide  (LOZOL) 1.25 mg tablet Barrington Ellison. Maisie Fus, MD]    Preferred  pharmacy: [id number] - [place], [place] - [address]  [place] [address]    Comment:  I  am requesting an emergency 30 days supply to my local pharmacy as my [facility] delivery will not be here until the next 10 days. I am presently out of all medication.

## 2016-12-14 MED ORDER — INDAPAMIDE 1.25 MG TAB
1.25 mg | ORAL_TABLET | ORAL | 0 refills | Status: DC
Start: 2016-12-14 — End: 2017-02-13

## 2016-12-14 MED ORDER — VALACYCLOVIR 1 G TAB
1 gram | ORAL_TABLET | Freq: Every day | ORAL | 0 refills | Status: DC
Start: 2016-12-14 — End: 2017-04-27

## 2016-12-14 MED ORDER — AMLODIPINE 5 MG TAB
5 mg | ORAL_TABLET | ORAL | 0 refills | Status: DC
Start: 2016-12-14 — End: 2017-02-13

## 2016-12-14 NOTE — Telephone Encounter (Signed)
Orders Placed This Encounter   ??? amLODIPine (NORVASC) 5 mg tablet     Sig: TAKE 1 TABLET BY MOUTH EVERY DAY  Indications: hypertension     Dispense:  30 Tab     Refill:  0   ??? indapamide (LOZOL) 1.25 mg tablet     Sig: TAKE 1 TABLET BY MOUTH DAILY  Indications: hypertension     Dispense:  30 Tab     Refill:  0   ??? valACYclovir (VALTREX) 1 gram tablet     Sig: Take 1 Tab by mouth daily.     Dispense:  30 Tab     Refill:  0

## 2016-12-14 NOTE — Telephone Encounter (Signed)
From: Janese BanksKareen Priestly  To: Barrington Ellisonlaire W. Maisie Fushomas, MD  Sent: 12/10/2016 10:46 AM EDT  Subject:  Prescription Question    Good  morning,  [facility] before the next 10 days. I am presently out of all medication. I am requesting an emergency 30 day supply to my local pharmacy. Please let me know if there are any questions. [phone]

## 2016-12-30 ENCOUNTER — Encounter: Attending: Family | Primary: Family

## 2017-01-10 ENCOUNTER — Ambulatory Visit: Admit: 2017-01-10 | Discharge: 2017-01-10 | Payer: PRIVATE HEALTH INSURANCE | Attending: Family | Primary: Family

## 2017-01-10 DIAGNOSIS — E559 Vitamin D deficiency, unspecified: Secondary | ICD-10-CM

## 2017-01-10 NOTE — Progress Notes (Signed)
Verified two paint identifiers, name and DOB. Patient provided with lab results. No questions at this time.

## 2017-01-10 NOTE — Patient Instructions (Addendum)
1) Healthy Weight  Body Mass Index is a noninvasive way to screen for weight and body fat.  This is a mathematical calculation based on your height and weight. A healthy BMI is between 20% -24.9%.   Your BMI is 28 %.  There is a relationship between high BMI and various healthy problems, such as osteoarthritis, muscle pain, increased risk of cancer, diabetes, heart disease, stroke, hypertension, high cholesterol, sleep apnea, breathing problems, depression, which is why a healthy BMI is so important.    Choose lean meats for protein source which include chicken, pork, and [place]. The recommended serving size for protein is a 2-3 oz serving (the size of your fist), and 1-1.5 oz of carbohydrate per meal (about 1 cup).  Increase servings of fruits and vegetables.  Limit processed carbohydrates, (i.e. most breads, crackers, pasta, chips, rice, breaded or battered food, etc). If you choose to eat carbohydrates, whole wheat, (instead of white) is a healthier option for bread, rice, and crackers. Avoid fried foods.  Limit sugar.  Do your best to avoid all sweetened beverages, such as alcohol, juice, sodas or sweet teas, drink water, unsweet tea, diet soda, or crystal light as options instead.  (Don't drink more than 2 of the 12oz artificially sweetened drinks per day as these can increase hunger and make it more difficult to lose weight.  The daily goal for water intake should be at least 64 oz/day for most people.     Fair Life milk is a healthy choice in milk products. It is double filtered to remove much of the lactose (sugar found in milk) and recommended by trainers and nutritionists. There is 13 g of protein in a serving, so it is an easy way to increase your protein and calcium intake.     Daily exercise will also help with weight loss and overall health.  A minimum of 150 minutes a week of moderate exercise is recommended (30 minutes per day).  Make exercise a routine part of your day - for example,  park in spaces far away from builidngs, take stairs instead of elevator, if sitting for long periods, get up from chair and walk every hour.    Recruit a friend or relative to exercise with you and keep you on schedule.  It is much easier to exercise with a buddy who will make sure you work out each day!    Meal planning smart phone applications like [employer] can help plan healthy meals.    Get 7-8 hours of sleep each night.     For reliable dietary information, go to [email or web address].fi.    2) Labs  The following blood work was ordered today.  Once the tests are completed, you will receive a letter, email or phone call with the results.  If you have not heard from [place] within 10-14 days, please call the office for the results.    Complete Blood Count (CBC) helps evaluate your overall health, including hemoglobin and red blood cells that carry oxygen, white blood cells that fight infection and platelets that help with clotting.      Complete Metabolic Panel (CMP) assesses electrolytes, kidney and liver function.  (A Basic Metabolic Panel (BMP) does not include liver function.)  Electrolytes include sodium, potassium, calcium, and chloride.  These are necessary for cell function and an imbalance can cause serious problems.   BUN and creatinine are markers of kidney function.    ALT and AST are markers of liver function.  Hemoglobin A1c is a 3 month average of your blood sugar and used as a marker of your diabetes control.   A normal value is less than 5.7%.    Total Cholesterol is the total number of cholesterol particles in your blood, which includes triglycerides, HDL and LDL.  A small amount of cholesterol is needed for the body's cells, hormones, and metabolism. Goal is less than 200.  Triglycerides are the short term fats in your blood which are used for energy.  Goal is less than 150.    High Density Lipids (HDL) is the "good" cholesterol in your blood.  HDL  helps remove bad cholesterol from your blood.   Goal is more than 40.   Low Density Lipids (LDL) is the "bad" cholesterol in your blood.  LDL can stick to your arteries, raising the risk for heart attack and stroke.Goal is less than 100    Urinalysis - this is a test of your urine to check your overall health.  It is recommended as a part of a routine check up and screens for a variety of disorders, such as urinary tract infections, kidney disease and diabetes.     Vitamin D is important for absorbing calcium and promoting bone growth.  If you are low in Vitamin D, you may experience fatigue, back or bone pain, hair loss, muscle pain, slow wound healing, depression.  Your body can make Vitamin D after exposure to sunlight or through foods like fatty fish (tuna, mackerel, salmon), food with Vitamin D added (dairy products, orange juice and cereals) and cheese, egg yolks and liver.   Vitamin D decreases with age and in the winter time when the sun is not strong. A deficiency in Vitamin D has been linked to certain cancers (breast, prostate, colon) as well as heart disease, depression and weight gain.      3) Please check your blood pressure through the week at staggered times in the day. (If you check in the morning, it should be at least one hour after your morning blood pressure medications.)      Arm monitors are most accurate.  If you use a wrist monitor, make sure your wrist is at heart level. You can bring your home monitor to your next visit and have it calibrated with the machine in the office to gauge your readings. Sit  with your feet uncrossed and relax for 5 minutes before taking your BP.    Keep a written record of your blood pressure readings and bring it to each appointment.  If your systolic (top) blood pressure is consistently greater than 140mmHg or less than 100mmHg of the diastolic (bottom) number is consistently greater than 90mmHg or less than 60mmHg then please schedule an office appointment.       Cardiac symptoms that would need immediate attention include: uncomfortable pressure, squeezing, fullness or pain in the center of your chest. Pain or discomfort in one or both arms, the back, neck, jaw or stomach. Shortness of breath with or without chest discomfort, breaking out in a cold sweat, nausea or lightheadedness.    3) Compression hose will help with varicose veins    4) Right Achilles  Rest - stay off/rest affected area as much as possible.    Ice - Apply ice to the affected area up to three times a day for 10 minute intervals (10 mins on, 10 mins off) for swelling and discomfort.  (Frozen peas work well as an ice pack.)  After  48 hours, switch to moist heat (which penetrates better than dry heat packs) - such as standing in a shower or applying a warm washcloth to area - for 10 minutes at a time, 3 times a day.   Compression - Apply a compressive ACE bandage.  As pain recedes, begin normal activities slowly as tolerated.  Elevate the affected area when possible       Well Visit, Ages 45 to 56: Care Instructions  Your Care Instructions    Physical exams can help you stay healthy. Your doctor has checked your overall health and may have suggested ways to take good care of yourself. He or she also may have recommended tests. At home, you can help prevent illness with healthy eating, regular exercise, and other steps.  Follow-up care is a key part of your treatment and safety. Be sure to make and go to all appointments, and call your doctor if you are having problems. It's also a good idea to know your test results and keep a list of the medicines you take.  How can you care for yourself at home?  ?? Reach and stay at a healthy weight. This will lower your risk for many problems, such as obesity, diabetes, heart disease, and high blood pressure.  ?? Get at least 30 minutes of physical activity on most days of the week. Walking is a good choice. You also may want to do other activities, such  as running, swimming, cycling, or playing tennis or team sports. Discuss any changes in your exercise program with your doctor.  ?? Do not smoke or allow others to smoke around you. If you need help quitting, talk to your doctor about stop-smoking programs and medicines. These can increase your chances of quitting for good.  ?? Talk to your doctor about whether you have any risk factors for sexually transmitted infections (STIs). Having one sex partner (who does not have STIs and does not have sex with anyone else) is a good way to avoid these infections.  ?? Use birth control if you do not want to have children at this time. Talk with your doctor about the choices available and what might be best for you.  ?? Protect your skin from too much sun. When you're outdoors from 10 a.m. to 4 p.m., stay in the shade or cover up with clothing and a hat with a wide brim. Wear sunglasses that block UV rays. Even when it's cloudy, put broad-spectrum sunscreen (SPF 30 or higher) on any exposed skin.  ?? See a dentist one or two times a year for checkups and to have your teeth cleaned.  ?? Wear a seat belt in the car.  ?? Drink alcohol in moderation, if at all. That means no more than 2 drinks a day for men and 1 drink a day for women.  Follow your doctor's advice about when to have certain tests. These tests can spot problems early.  For everyone  ?? Cholesterol. Have the fat (cholesterol) in your blood tested after age 42. Your doctor will tell you how often to have this done based on your age, family history, or other things that can increase your risk for heart disease.  ?? Blood pressure. Have your blood pressure checked during a routine doctor visit. Your doctor will tell you how often to check your blood pressure based on your age, your blood pressure results, and other factors.  ?? Vision. Talk with your doctor about how often to have a  glaucoma test.  ?? Diabetes. Ask your doctor whether you should have tests for diabetes.   ?? Colon cancer. Have a test for colon cancer at age 75. You may have one of several tests. If you are younger than 75, you may need a test earlier if you have any risk factors. Risk factors include whether you already had a precancerous polyp removed from your colon or whether your parent, brother, sister, or child has had colon cancer.  For women  ?? Breast exam and mammogram. Talk to your doctor about when you should have a clinical breast exam and a mammogram. Medical experts differ on whether and how often women under 50 should have these tests. Your doctor can help you decide what is right for you.  ?? Pap test and pelvic exam. Begin Pap tests at age 39. A Pap test is the best way to find cervical cancer. The test often is part of a pelvic exam. Ask how often to have this test.  ?? Tests for sexually transmitted infections (STIs). Ask whether you should have tests for STIs. You may be at risk if you have sex with more than one person, especially if your partners do not wear condoms.  For men  ?? Tests for sexually transmitted infections (STIs). Ask whether you should have tests for STIs. You may be at risk if you have sex with more than one person, especially if you do not wear a condom.  ?? Testicular cancer exam. Ask your doctor whether you should check your testicles regularly.  ?? Prostate exam. Talk to your doctor about whether you should have a blood test (called a PSA test) for prostate cancer. Experts differ on whether and when men should have this test. Some experts suggest it if you are older than 55 and are African-American or have a father or brother who got prostate cancer when he was younger than 62.  When should you call for help?  Watch closely for changes in your health, and be sure to contact your doctor if you have any problems or symptoms that concern you.  Where can you learn more?  Go to [employer].   Enter P072 in the search box to learn more about "Well Visit, Ages 66 to 16: Care Instructions."  Current as of: June 16, 2016  Content Version: 11.8  ?? 2006-2018 [employer]. Care instructions adapted under license by [employer] (which disclaims liability or warranty for this information). If you have questions about a medical condition or this instruction, always ask your healthcare professional. [employer] disclaims any warranty or liability for your use of this information.         Achilles Tendon: Exercises  Your Care Instructions  Here are some examples of exercises for your achilles tendon. Start each exercise slowly. Ease off the exercise if you start to have pain.  Your doctor or physical therapist will tell you when you can start these exercises and which ones will work best for you.  Toe stretch  Toe stretch    1. Sit in a chair, and extend your affected leg so that your heel is on the floor.  2. With your hand, reach down and pull your big toe up and back. Pull toward your ankle and away from the floor.  3. Hold the position for at least 15 to 30 seconds.  4. Repeat 2 to 4 times a session, several times a day.    Calf-plantar fascia stretch  1. Sit with your legs extended and knees straight.  2. Place a towel around your foot just under the toes.  3. Hold each end of the towel in each hand, with your hands above your knees.  4. Pull back with the towel so that your foot stretches toward you.  5. Hold the position for at least 15 to 30 seconds.  6. Repeat 2 to 4 times a session, up to 5 sessions a day.    Floor stretch    1. Stand about 2 feet from a wall, and place your hands on the wall at about shoulder height. Or you can stand behind a chair, placing your hands on the back of it for balance.  2. Step back with the leg you want to stretch. Keep the leg straight, and press your heel into the floor with your toe turned slightly in.   3. Lean forward, and bend your other leg slightly. Feel the stretch in the Achilles tendon of your back leg. Hold for at least 15 to 30 seconds.  4. Repeat 2 to 4 times a session, up to 5 sessions a day.    Stair stretch    1. Stand with the balls of both feet on the edge of a step or curb (or a medium-sized phone book). With at least one hand, hold onto something solid for balance, such as a banister or handrail.  2. Keeping your affected leg straight, slowly let that heel hang down off of the step or curb until you feel a stretch in the back of your calf and/or Achilles area. Some of your weight should still be on the other leg.  3. Hold this position for at least 15 to 30 seconds.  4. Repeat 2 to 4 times a session, up to 5 times a day or whenever your Achilles tendon starts to feel tight. This stretch can also be done with your knee slightly bent.    Strength exercise    1. This exercise will get you started on building strength after an Achilles tendon injury. Your doctor or physical therapist can help you move on to more challenging exercises as you heal and get stronger.  2. Stand on a step with your heel off the edge of the step. Hold on to a handrail or wall for balance.  3. Push up on your toes, then slowly count to 10 as you lower yourself back down until your heel is below the step. If it hurts to push up on your toes, try putting most of your weight on your other foot as you push up, or try using your arms to help you. If you can't do this exercise without causing pain, stop the exercise and talk to your doctor.  4. Repeat the exercise 8 to 12 times, half with the knee straight and half with the knee bent.    Follow-up care is a key part of your treatment and safety. Be sure to make and go to all appointments, and call your doctor if you are having problems. It's also a good idea to know your test results and keep a list of the medicines you take.  Where can you learn more?   Go to [employer].  Enter [phone] in the search box to learn more about "Achilles Tendon: Exercises."  Current as of: February 17, 2016  Content Version: 11.8  ?? 2006-2018 [employer]. Care instructions adapted under license by [employer] (which disclaims liability or warranty for  this information). If you have questions about a medical condition or this instruction, always ask your healthcare professional. [employer] disclaims any warranty or liability for your use of this information.

## 2017-01-10 NOTE — Progress Notes (Signed)
Ferrint, folate = WNL, B12 - low normal, recommended B complex supplement. Result letter mailed.

## 2017-01-10 NOTE — Progress Notes (Signed)
Michelle Benitez  Identified pt with two pt identifiers(name and DOB).  Chief Complaint   Patient presents with   ??? Complete Physical     rm 10/fasting/pain in achilles tendon/labia itching       1. Have you been to the ER, urgent care clinic since your last visit?no  Hospitalized since your last visit? no    2. Have you seen or consulted any other health care providers outside of the [facility] since your last visit?  Include any pap smears or colon screening. no      Advance Care Planning    In the event something were to happen to you and you were unable to speak on your behalf, do you have an Advance Directive/ Living Will in place stating your wishes? NO    If yes, do we have a copy on file NO    If no, would you like information YES    Medication reconciliation up to date and corrected with patient at this time.     Today's provider has been notified of reason for visit, vitals and flowsheets obtained on patients.     Reviewed record in preparation for visit, huddled with provider and have obtained necessary documentation.      Health Maintenance Due   Topic   ??? Pneumococcal 19-64 Highest Risk (1 of 3 - PCV13)   ??? PAP AKA CERVICAL CYTOLOGY        Wt Readings from Last 3 Encounters:   01/10/17 183 lb 12.8 oz (83.4 kg)   09/01/16 184 lb 8 oz (83.7 kg)   05/24/16 178 lb 3.2 oz (80.8 kg)     Temp Readings from Last 3 Encounters:   01/10/17 97.6 ??F (36.4 ??C) (Oral)   09/01/16 96.7 ??F (35.9 ??C) (Oral)   05/24/16 98.2 ??F (36.8 ??C) (Oral)     BP Readings from Last 3 Encounters:   01/10/17 (!) 141/95   09/01/16 139/89   05/24/16 130/80     Pulse Readings from Last 3 Encounters:   01/10/17 68   09/01/16 76   05/24/16 68     Vitals:    01/10/17 1039   BP: (!) 141/95   Pulse: 68   Resp: 18   Temp: 97.6 ??F (36.4 ??C)   TempSrc: Oral   SpO2: 98%   Weight: 183 lb 12.8 oz (83.4 kg)   Height: 5\' 7"  (1.702 m)   PainSc:   0 - No pain         Learning Assessment:  :     Learning Assessment 09/05/2013    PRIMARY LEARNER Patient   HIGHEST LEVEL OF EDUCATION - PRIMARY LEARNER  > 4 YEARS OF COLLEGE   BARRIERS PRIMARY LEARNER NONE   CO-LEARNER CAREGIVER No   CO-LEARNER NAME n/a   PRIMARY LANGUAGE ENGLISH   LEARNER PREFERENCE PRIMARY READING   ANSWERED BY patient   RELATIONSHIP SELF       Depression Screening:  :     PHQ over the last two weeks 01/10/2017   Little interest or pleasure in doing things Not at all   Feeling down, depressed, irritable, or hopeless Not at all   Total Score PHQ 2 0   Trouble falling or staying asleep, or sleeping too much -   Feeling tired or having little energy -   Poor appetite, weight loss, or overeating -   Feeling bad about yourself - or that you are a failure or have let yourself or  your family down -   Trouble concentrating on things such as school, work, reading, or watching TV -   Moving or speaking so slowly that other people could have noticed; or the opposite being so fidgety that others notice -   Thoughts of being better off dead, or hurting yourself in some way -   PHQ 9 Score -   How difficult have these problems made it for you to do your work, take care of your home and get along with others -       Fall Risk Assessment:  :     No flowsheet data found.    Abuse Screening:  :     Abuse Screening Questionnaire 01/10/2017 05/24/2016   Do you ever feel afraid of your partner? N N   Are you in a relationship with someone who physically or mentally threatens you? N N   Is it safe for you to go home? Y Y       ADL Screening:  :     ADL Assessment 05/24/2016   Feeding yourself No Help Needed   Getting from bed to chair No Help Needed   Getting dressed No Help Needed   Bathing or showering No Help Needed   Walk across the room (includes cane/walker) No Help Needed   Using the telphone No Help Needed   Taking your medications No Help Needed   Preparing meals No Help Needed   Managing money (expenses/bills) No Help Needed   Moderately strenuous housework (laundry) No Help Needed    Shopping for personal items (toiletries/medicines) No Help Needed   Shopping for groceries No Help Needed   Driving No Help Needed   Climbing a flight of stairs No Help Needed   Getting to places beyond walking distances No Help Needed           BMI:  Weight Metrics 01/10/2017 09/01/2016 05/24/2016 05/10/2016 05/02/2016 12/31/2015 11/10/2015   Weight 183 lb 12.8 oz 184 lb 8 oz 178 lb 3.2 oz 177 lb 183 lb 176 lb 9.6 oz 183 lb   BMI 28.79 kg/m2 28.9 kg/m2 27.91 kg/m2 27.72 kg/m2 28.66 kg/m2 27.66 kg/m2 28.66 kg/m2           Medication reconciliation up to date and corrected with patient at this time.

## 2017-01-10 NOTE — Addendum Note (Signed)
Addended by: Trina AoSHANAHAN, Orley Lawry B on: 01/11/2017 09:41 PM     Modules accepted: Orders

## 2017-01-10 NOTE — Progress Notes (Signed)
S: Michelle Benitez is a 46 y.o. BLACK OR [place] AMER[place] female who presents for     Assessment/Plan:    1. Well woman annual  -discussed healthy diet and increasing daily exercise  -labs today: CBC, CMP, urinalysis, A1c, lipid, Vit D  -HM: sees GYN for PAP, ordered mammo today; allergy to flu vaccine,      2. Essential hypertension  -current therapy: amlodipine 5mg  + indapamide 1.25mg  daily  -well controlled at home; OV today = 141/95  -advised to monitor BP at home and keep log; if >140/90 or <110/60, make OV  -compression hose for varicose veins    3. Vaginal Itching    -no erythema, edema or discharge noted  -advised to avoid feminine hygiene products, use no soap or Dove w/o additives and lukewarm water to clean vaginal area  -advised to have test for BV/yeast with PAP    RTC 1 year, pending lab results     HPI:  Right ankle injury as a teen - stake went through foot when she was training/running (had Olympic hopes)    Social history:   Nutrition: overall healthy  Breakfast: skips or oatmeal, egg and banana,   Lunch: take dinner leftovers   Dinner: chicken and rice, veggies, pasta   Drinks: water w/lemon (no soda, or juices) -   Physical: works out 3x week   Social: 2 kids - 23yo and a Engineer, building serviceskindgartener   Occupation: Loss adjuster, [employer] - 1st grade    Social History     Tobacco Use   Smoking Status Never Smoker   Smokeless Tobacco Never Used     Social History     Substance and Sexual Activity   Alcohol Use Yes   ??? Alcohol/week: 0.0 oz    Comment: glasses of wine occasional, less than once a month     Social History     Substance and Sexual Activity   Drug Use No     Social History     Substance and Sexual Activity   Sexual Activity Yes   ??? Partners: Male   ??? Birth control/protection: None, Surgical    Comment: monogamous with partner since about 2014       No LMP recorded. Patient has had an ablation.    Review of Systems:  - Constitutional Symptoms: no fevers/chills  - Eyes: no blurry vision or double vision   - Cardiovascular: no chest pain or palpitations  - Respiratory: no cough or shortness of breath  - Gastrointestinal: no dysphagia or abdominal pain  - Musculoskeletal: + joint pains - right ankle, no weakness  - Integumentary: no rashes  - Neurological: no numbness, tingling, or headaches  - Psychiatric: no depression or anxiety  ROS is negative otherwise.    PHQ over the last two weeks 01/10/2017   Little interest or pleasure in doing things Not at all   Feeling down, depressed, irritable, or hopeless Not at all   Total Score PHQ 2 0   Trouble falling or staying asleep, or sleeping too much -   Feeling tired or having little energy -   Poor appetite, weight loss, or overeating -   Feeling bad about yourself - or that you are a failure or have let yourself or your family down -   Trouble concentrating on things such as school, work, reading, or watching TV -   Moving or speaking so slowly that other people could have noticed; or the opposite being so fidgety that others notice -  Thoughts of being better off dead, or hurting yourself in some way -   PHQ 9 Score -   How difficult have these problems made it for you to do your work, take care of your home and get along with others -       I reviewed the following:  Past Medical History:   Diagnosis Date   ??? Anemia     Last CBC: h/h 10.1/29.8 (10/2008)   ??? Genital herpes    ??? Headache    ??? History of loop electrical excision procedure (LEEP)    ??? 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 Repeat Pap in 1 year   ??? Hypertension    ??? Vitamin D deficiency 10/14/2013       Current Outpatient Medications   Medication Sig Dispense Refill   ??? amLODIPine (NORVASC) 5 mg tablet TAKE 1 TABLET BY MOUTH EVERY DAY  Indications: hypertension 30 Tab 0   ??? indapamide (LOZOL) 1.25 mg tablet TAKE 1 TABLET BY MOUTH DAILY  Indications: hypertension 30 Tab 0   ??? valACYclovir (VALTREX) 1 gram tablet Take 1 Tab by mouth daily. 30 Tab 0    ??? multivitamin (ONE A DAY) tablet Take 1 Tab by mouth daily.     ??? butalbital-acetaminophen-caffeine (FIORICET, ESGIC) 50-325-40 mg per tablet Take 1 Tab by mouth daily as needed (severe headache). 10 Tab 0       Allergies   Allergen Reactions   ??? Influenza Virus Vaccine, Specific Swelling     Happened 2 years in a row.   ??? Lexapro [Escitalopram] Other (comments)     insomnia   ??? Lisinopril Angioedema     Lip swelling on 07/10/13 while taking lisinopril-HCTZ        Health Maintenance:  Eye exam: due for one  PAP: due - [facility]  Mammogram: had one after 2015 - had ultrasound d/t dense breast tissue, WNL  Tdap: 2017  Flu: declines - had allergic rxn 2 years ago x2    O: VS:   Visit Vitals  BP (!) 141/95 (BP 1 Location: Left arm, BP Patient Position: Sitting)   Pulse 68   Temp 97.6 ??F (36.4 ??C) (Oral)   Resp 18   Ht 5\' 7"  (1.702 m)   Wt 183 lb 12.8 oz (83.4 kg)   SpO2 98%   BMI 28.79 kg/m??       Data Reviewed:  Michelle Benitez has gained 5lbs since last visit 05/24/16  Labs:   A1C:      Lab Results   Component Value Date/Time    Hemoglobin A1c 5.2 12/24/2015 09:01 AM     ?? Lipids:  Lab Results   Component Value Date/Time    Cholesterol, total 163 12/24/2015 09:01 AM    HDL Cholesterol 58 12/24/2015 09:01 AM    LDL, calculated 96 12/24/2015 09:01 AM    VLDL, calculated 9 12/24/2015 09:01 AM    Triglyceride 47 12/24/2015 09:01 AM       GENERAL: Michelle Benitez is in no acute distress. Non-toxic. Well nourished. Well developed. Appropriately groomed.  HEAD:  Normocephalic.  Atraumatic.  Non tender sinuses x 4.  EYE: PERRLA. EOMs intact. Sclera anicteric without injection. No drainage or discharge.   EARS: Hearing intact bilaterally. External ear canals normal without evidence of blood or swelling. Bilateral TM's intact, pearly grey with landmarks visible. No erythema or effusion.  NOSE: Patent. Nasal turbinates pink.  No erythema. No discharge.     MOUTH: mucous membranes pink and moist. Posterior pharynx normal with cobblestone appearance.  No erythema, white exudate or obstruction.  NECK: supple.  Midline trachea.  No carotid bruits noted bilaterally.  No thyromegaly noted.  No cervical adenopathy noted.   RESP: Breath sounds are symmetrical bilaterally. Unlabored without SOB. Speaking in full sentences. Clear to auscultation bilaterally anteriorly and posteriorly.  No wheezes.  No rales or rhonchi.    CV: normal rate.  Regular rhythm. S1, S2 audible.  No murmur noted.  No rubs, clicks or gallops noted.  BACK: No visible deformities or curvature. Full ROM.  No pain on palpation of the spinous processes in the cervical, thoracic, lumbar, sacral regions.  No CVA tenderness.    GU: Ext genitalia intact without inflammation, ulceration, lesions or discharge.      NEURO:  awake, alert and oriented to person, place, and time and event.  Cranial nerves II through XII intact.  Clear speech.  Muscle strength is +5/5 x 4 extremities.  Sensation is intact to light touch bilaterally.  Steady gait.   MUSC:  Intact x 4 extremities.  Full ROM x 4 extremities.  No pain with movement. Patellar reflexes 2+  Right ankle with mild edema, no erythema. No ttp.  Thompson Squeeze: Negative  Taylor tilt - negative  HEME/LYMPH: peripheral pulses palpable 2+ x 4 extremities.  No peripheral edema is noted.    SKIN: Skin is warm and dry. Turgor is normal. No petechiae, no purpura, no rash. No cyanosis. No mottling, jaundice or pallor.   PSYCH: appropriate behavior, dress and thought processes.  Good eye contact.  Clear and coherent speech. Full affect. Good insight.   ______________________________________________________________________  Patient education was done.  Advised on nutrition, physical activity, weight management, tobacco, alcohol and safety.  Counseling included discussion of diagnosis, differentials, treatment options, prescribed  treatment, warning signs and follow up. Medication risks/benefits,interactions and alternatives discussed with patient.   ??  Patient verbalized understanding and agreed to plan of care.  Patient was given an after visit summary which included current diagnoses, medications and vital signs.    Follow up as directed.

## 2017-01-10 NOTE — Progress Notes (Signed)
Vit D low, rx q7days x12wks;recehck 3 months.  low RBC, ferritin ordered Result ltr mailed.

## 2017-01-11 LAB — HEMOGLOBIN A1C WITH EAG
Estimated average glucose: 103 mg/dL
Hemoglobin A1c: 5.2 % (ref 4.8–5.6)

## 2017-01-11 LAB — UA/M W/RFLX CULTURE, ROUTINE
Bilirubin: NEGATIVE
Blood: NEGATIVE
Glucose: NEGATIVE
Ketone: NEGATIVE
Leukocyte Esterase: NEGATIVE
Nitrites: NEGATIVE
Protein: NEGATIVE
Specific Gravity: 1.018 (ref 1.005–1.030)
Urobilinogen: 0.2 mg/dL (ref 0.2–1.0)
pH (UA): 7.5 (ref 5.0–7.5)

## 2017-01-11 LAB — CBC WITH AUTOMATED DIFF
ABS. BASOPHILS: 0 10*3/uL (ref 0.0–0.2)
ABS. EOSINOPHILS: 0 10*3/uL (ref 0.0–0.4)
ABS. IMM. GRANS.: 0 10*3/uL (ref 0.0–0.1)
ABS. MONOCYTES: 0.2 10*3/uL (ref 0.1–0.9)
ABS. NEUTROPHILS: 1.7 10*3/uL (ref 1.4–7.0)
Abs Lymphocytes: 1.5 10*3/uL (ref 0.7–3.1)
BASOPHILS: 0 %
EOSINOPHILS: 1 %
HCT: 36.6 % (ref 34.0–46.6)
HGB: 11.5 g/dL (ref 11.1–15.9)
IMMATURE GRANULOCYTES: 0 %
Lymphocytes: 43 %
MCH: 31.1 pg (ref 26.6–33.0)
MCHC: 31.4 g/dL — ABNORMAL LOW (ref 31.5–35.7)
MCV: 99 fL — ABNORMAL HIGH (ref 79–97)
MONOCYTES: 6 %
NEUTROPHILS: 50 %
PLATELET: 287 10*3/uL (ref 150–379)
RBC: 3.7 x10E6/uL — ABNORMAL LOW (ref 3.77–5.28)
RDW: 13.7 % (ref 12.3–15.4)
WBC: 3.4 10*3/uL (ref 3.4–10.8)

## 2017-01-11 LAB — MICROSCOPIC EXAMINATION
Bacteria: NONE SEEN
Casts: NONE SEEN /lpf
Epithelial cells: NONE SEEN /hpf (ref 0–10)

## 2017-01-11 LAB — METABOLIC PANEL, COMPREHENSIVE
A-G Ratio: 1.6 (ref 1.2–2.2)
ALT (SGPT): 9 IU/L (ref 0–32)
AST (SGOT): 13 IU/L (ref 0–40)
Albumin: 4.5 g/dL (ref 3.5–5.5)
Alk. phosphatase: 46 IU/L (ref 39–117)
BUN/Creatinine ratio: 14 (ref 9–23)
BUN: 10 mg/dL (ref 6–24)
Bilirubin, total: 0.6 mg/dL (ref 0.0–1.2)
CO2: 25 mmol/L (ref 20–29)
Calcium: 9.6 mg/dL (ref 8.7–10.2)
Chloride: 101 mmol/L (ref 96–106)
Creatinine: 0.69 mg/dL (ref 0.57–1.00)
GFR est AA: 122 mL/min/{1.73_m2} (ref >59)
GFR est non-AA: 105 mL/min/{1.73_m2} (ref >59)
GLOBULIN, TOTAL: 2.9 g/dL (ref 1.5–4.5)
Glucose: 89 mg/dL (ref 65–99)
Potassium: 3.9 mmol/L (ref 3.5–5.2)
Protein, total: 7.4 g/dL (ref 6.0–8.5)
Sodium: 142 mmol/L (ref 134–144)

## 2017-01-11 LAB — LIPID PANEL
Cholesterol, total: 171 mg/dL (ref 100–199)
HDL Cholesterol: 58 mg/dL (ref >39)
LDL, calculated: 100 mg/dL — ABNORMAL HIGH (ref 0–99)
Triglyceride: 63 mg/dL (ref 0–149)
VLDL, calculated: 13 mg/dL (ref 5–40)

## 2017-01-11 LAB — CVD REPORT

## 2017-01-11 LAB — VITAMIN D, 25 HYDROXY: VITAMIN D, 25-HYDROXY: 18.7 ng/mL — ABNORMAL LOW (ref 30.0–100.0)

## 2017-01-12 MED ORDER — ERGOCALCIFEROL (VITAMIN D2) 50,000 UNIT CAP
1250 mcg (50,000 unit) | ORAL_CAPSULE | ORAL | 0 refills | Status: DC
Start: 2017-01-12 — End: 2017-08-16

## 2017-01-13 ENCOUNTER — Encounter

## 2017-01-13 LAB — SPECIMEN STATUS REPORT

## 2017-01-13 LAB — FERRITIN: Ferritin: 80 ng/mL (ref 15–150)

## 2017-01-15 LAB — SPECIMEN STATUS REPORT

## 2017-01-15 LAB — VITAMIN B12: Vitamin B12: 288 pg/mL (ref 232–1245)

## 2017-01-15 LAB — FOLATE: Folate: 9.5 ng/mL (ref >3.0)

## 2017-01-30 ENCOUNTER — Ambulatory Visit: Payer: PRIVATE HEALTH INSURANCE | Primary: Family

## 2017-01-31 ENCOUNTER — Encounter

## 2017-01-31 ENCOUNTER — Inpatient Hospital Stay: Admit: 2017-01-31 | Payer: PRIVATE HEALTH INSURANCE | Attending: Family | Primary: Family

## 2017-01-31 DIAGNOSIS — Z1231 Encounter for screening mammogram for malignant neoplasm of breast: Secondary | ICD-10-CM

## 2017-02-13 ENCOUNTER — Encounter

## 2017-02-13 NOTE — Telephone Encounter (Signed)
Requested Prescriptions     Pending Prescriptions Disp Refills   ??? amLODIPine (NORVASC) 5 mg tablet 30 Tab 0     Sig: TAKE 1 TABLET BY MOUTH EVERY DAY   ??? indapamide (LOZOL) 1.25 mg tablet 30 Tab 0     Sig: TAKE 1 TABLET BY MOUTH DAILY

## 2017-02-14 MED ORDER — AMLODIPINE 5 MG TAB
5 mg | ORAL_TABLET | ORAL | 1 refills | Status: DC
Start: 2017-02-14 — End: 2017-04-27

## 2017-02-14 MED ORDER — INDAPAMIDE 1.25 MG TAB
1.25 mg | ORAL_TABLET | ORAL | 1 refills | Status: DC
Start: 2017-02-14 — End: 2017-04-27

## 2017-02-14 NOTE — Telephone Encounter (Signed)
PCP: Catarina Hartshornhomas, Claire W., MD    Last appt: 01/10/2017  No future appointments.    Requested Prescriptions     Pending Prescriptions Disp Refills   ??? amLODIPine (NORVASC) 5 mg tablet 30 Tab 0     Sig: TAKE 1 TABLET BY MOUTH EVERY DAY   ??? indapamide (LOZOL) 1.25 mg tablet 30 Tab 0     Sig: TAKE 1 TABLET BY MOUTH DAILY       Prior labs and Blood pressures:  BP Readings from Last 3 Encounters:   01/10/17 (!) 141/95   09/01/16 139/89   05/24/16 130/80     Lab Results   Component Value Date/Time    Sodium 142 01/10/2017 11:31 AM    Potassium 3.9 01/10/2017 11:31 AM    Chloride 101 01/10/2017 11:31 AM    CO2 25 01/10/2017 11:31 AM    Glucose 89 01/10/2017 11:31 AM    BUN 10 01/10/2017 11:31 AM    Creatinine 0.69 01/10/2017 11:31 AM    BUN/Creatinine ratio 14 01/10/2017 11:31 AM    GFR est AA 122 01/10/2017 11:31 AM    GFR est non-AA 105 01/10/2017 11:31 AM    Calcium 9.6 01/10/2017 11:31 AM     Lab Results   Component Value Date/Time    Hemoglobin A1c 5.2 01/10/2017 11:31 AM     Lab Results   Component Value Date/Time    Cholesterol, total 171 01/10/2017 11:31 AM    HDL Cholesterol 58 01/10/2017 11:31 AM    LDL, calculated 100 (H) 01/10/2017 11:31 AM    VLDL, calculated 13 01/10/2017 11:31 AM    Triglyceride 63 01/10/2017 11:31 AM     Lab Results   Component Value Date/Time    VITAMIN D, 25-HYDROXY 18.7 (L) 01/10/2017 11:31 AM       Lab Results   Component Value Date/Time    TSH 0.596 10/11/2013 02:35 PM

## 2017-03-31 ENCOUNTER — Other Ambulatory Visit: Payer: Self-pay | Admitting: Nurse Practitioner

## 2017-03-31 DIAGNOSIS — Z139 Encounter for screening, unspecified: Secondary | ICD-10-CM

## 2017-04-21 ENCOUNTER — Ambulatory Visit
Admission: RE | Admit: 2017-04-21 | Discharge: 2017-04-21 | Disposition: A | Discharge status: Home / Self Care | Payer: BLUE CROSS/BLUE SHIELD | Source: Ambulatory Visit | Attending: Nurse Practitioner | Admitting: Nurse Practitioner

## 2017-04-21 DIAGNOSIS — Z1231 Encounter for screening mammogram for malignant neoplasm of breast: Secondary | ICD-10-CM | POA: Diagnosis not present

## 2017-04-21 DIAGNOSIS — Z139 Encounter for screening, unspecified: Secondary | ICD-10-CM

## 2017-04-24 ENCOUNTER — Encounter

## 2017-04-24 NOTE — Telephone Encounter (Signed)
-----   Message from Delories Heinzhannel E Patterson sent at 04/24/2017 11:58 AM EST -----  Regarding: Michelle BurnShanahan NP/Telephone/Refill   Pt's pharmacy, [facility], is requesting a follow-up behind the fax sent for the Rx renewal for the Pt's Rx.  She was a Pt of [facility] but she sees Michelle Benitez now.  Best contact number for Alvino Chapelllen @[facility]  is [phone].

## 2017-04-25 ENCOUNTER — Other Ambulatory Visit: Payer: Self-pay | Admitting: Nurse Practitioner

## 2017-04-25 DIAGNOSIS — R928 Other abnormal and inconclusive findings on diagnostic imaging of breast: Secondary | ICD-10-CM

## 2017-04-27 MED ORDER — INDAPAMIDE 1.25 MG TAB
1.25 mg | ORAL_TABLET | ORAL | 3 refills | Status: DC
Start: 2017-04-27 — End: 2017-05-25

## 2017-04-27 MED ORDER — VALACYCLOVIR 1 G TAB
1 gram | ORAL_TABLET | Freq: Every day | ORAL | 0 refills | Status: DC
Start: 2017-04-27 — End: 2017-08-08

## 2017-04-27 MED ORDER — AMLODIPINE 5 MG TAB
5 mg | ORAL_TABLET | ORAL | 3 refills | Status: DC
Start: 2017-04-27 — End: 2017-06-27

## 2017-04-27 NOTE — Telephone Encounter (Signed)
PCP: Jackey LogeShanahan, Kelley B, NP    Last appt: 01/10/2017  No future appointments.    Requested Prescriptions     Pending Prescriptions Disp Refills   ??? amLODIPine (NORVASC) 5 mg tablet 90 Tab 1     Sig: TAKE 1 TABLET BY MOUTH EVERY DAY   ??? indapamide (LOZOL) 1.25 mg tablet 90 Tab 1     Sig: TAKE 1 TABLET BY MOUTH DAILY   ??? valACYclovir (VALTREX) 1 gram tablet 30 Tab 0     Sig: Take 1 Tab by mouth daily.       Prior labs and Blood pressures:  BP Readings from Last 3 Encounters:   01/10/17 (!) 141/95   09/01/16 139/89   05/24/16 130/80     Lab Results   Component Value Date/Time    Sodium 142 01/10/2017 11:31 AM    Potassium 3.9 01/10/2017 11:31 AM    Chloride 101 01/10/2017 11:31 AM    CO2 25 01/10/2017 11:31 AM    Glucose 89 01/10/2017 11:31 AM    BUN 10 01/10/2017 11:31 AM    Creatinine 0.69 01/10/2017 11:31 AM    BUN/Creatinine ratio 14 01/10/2017 11:31 AM    GFR est AA 122 01/10/2017 11:31 AM    GFR est non-AA 105 01/10/2017 11:31 AM    Calcium 9.6 01/10/2017 11:31 AM     Lab Results   Component Value Date/Time    Hemoglobin A1c 5.2 01/10/2017 11:31 AM     Lab Results   Component Value Date/Time    Cholesterol, total 171 01/10/2017 11:31 AM    HDL Cholesterol 58 01/10/2017 11:31 AM    LDL, calculated 100 (H) 01/10/2017 11:31 AM    VLDL, calculated 13 01/10/2017 11:31 AM    Triglyceride 63 01/10/2017 11:31 AM     Lab Results   Component Value Date/Time    VITAMIN D, 25-HYDROXY 18.7 (L) 01/10/2017 11:31 AM       Lab Results   Component Value Date/Time    TSH 0.596 10/11/2013 02:35 PM

## 2017-04-27 NOTE — Telephone Encounter (Signed)
[facility] which medications needed to be refilled. Writer spoke with pharmacist. Patient's name and DOB.  Per pharmacist, they were denied by Dr. Maisie Fushomas stated that patient was no longer under her care. Stated that patient is requesting amlodipine, indapamide, and valacyclovir to be refilled. Writer verbalized understanding and appreciation. Stated that [facility], and if she approves them, they will be sent electronically. Pharmacist verbalized understanding and appreciation.

## 2017-04-28 ENCOUNTER — Ambulatory Visit
Admission: RE | Admit: 2017-04-28 | Discharge: 2017-04-28 | Disposition: A | Discharge status: Home / Self Care | Payer: BLUE CROSS/BLUE SHIELD | Source: Ambulatory Visit | Attending: Nurse Practitioner | Admitting: Nurse Practitioner

## 2017-04-28 ENCOUNTER — Ambulatory Visit: Payer: BLUE CROSS/BLUE SHIELD

## 2017-04-28 DIAGNOSIS — R922 Inconclusive mammogram: Secondary | ICD-10-CM | POA: Diagnosis not present

## 2017-04-28 DIAGNOSIS — R928 Other abnormal and inconclusive findings on diagnostic imaging of breast: Secondary | ICD-10-CM

## 2017-05-25 ENCOUNTER — Encounter

## 2017-05-25 MED ORDER — INDAPAMIDE 1.25 MG TAB
1.25 mg | ORAL_TABLET | ORAL | 3 refills | Status: DC
Start: 2017-05-25 — End: 2017-10-29

## 2017-06-14 ENCOUNTER — Ambulatory Visit: Payer: BLUE CROSS/BLUE SHIELD | Admitting: Nurse Practitioner

## 2017-06-14 ENCOUNTER — Encounter: Payer: Self-pay | Admitting: Nurse Practitioner

## 2017-06-14 VITALS — BP 150/104 | HR 90 | Temp 98.6°F | Resp 16 | Ht 63.0 in | Wt 152.8 lb

## 2017-06-14 DIAGNOSIS — R05 Cough: Secondary | ICD-10-CM

## 2017-06-14 DIAGNOSIS — I10 Essential (primary) hypertension: Secondary | ICD-10-CM | POA: Diagnosis not present

## 2017-06-14 DIAGNOSIS — R059 Cough, unspecified: Secondary | ICD-10-CM

## 2017-06-14 DIAGNOSIS — L819 Disorder of pigmentation, unspecified: Secondary | ICD-10-CM

## 2017-06-14 MED ORDER — FLUTICASONE PROPIONATE 50 MCG/ACT NA SUSP: 2.0000 | Freq: Every day | NASAL | 6 refills | Status: DC

## 2017-06-14 MED ORDER — AMLODIPINE BESYLATE 5 MG PO TABS: 5.0000 mg | ORAL_TABLET | Freq: Every day | ORAL | 1 refills | Status: DC

## 2017-06-14 NOTE — Progress Notes (Signed)
Name: Ruth Schmidt   MRN: [id number]    DOB: 12/06/1970   Date:06/14/2017       Progress Note  Subjective  Chief Complaint  Chief Complaint  Patient presents with  . Establish Care    Blood pressure, cough left over from cold, states it has been going on for 2 months    HPI Ruth Schmidt is establishing care with me today, she was following with a prior provider in our clinic and was last seen here in 2015  Hypertension -maintained off medications  She was on losartan for some time and had to stop due to no insurance, she does not want to go back on losartan due to all of the recent recalls She has never been on any other medications for her BP. Reports daily medication compliance without adverse medication effects. Reports she checks her blood pressure routinely with nurse at work- readings have been 150s/100s. Denies headaches, vision changes, chest pain, shortness of breath, edema.  BP Readings from Last 3 Encounters:  06/14/17 (!) 150/104  01/15/14 118/79  11/14/13 126/84   Cough- This is a new problem She initially developed cold, URI symptoms about 2 months ago-  chills, sore throat and cough. Her symptoms lasted for about a week and then resolved with exception of cough She has continued to have a cough with clear sputum. She has also noticed rhinorrhea, post nasal drip. She has been taking mucinex with some relief of the cough. She does occasionally notice mild heartburn. She is not a smoker.  Skin problem- This is not a new problem She reports light patches of skin to forearms present for years- she was actually evaluated for lupus due to the skin discoloration in 2014 with negative labwork and was referred to dermatology but unclear whether she ever went - requests referral back to dermatology today Occasionally the areas feel itchy There is no pain, redness, exudate or swelling   Patient Active Problem List   Diagnosis Date Noted  . Hyperpigmentation 09/26/2013   . Anemia, iron deficiency 01/04/2013  . Hypertension   . Routine gynecological examination 07/24/2012    Past Surgical History:  Procedure Laterality Date  . CESAREAN SECTION    . TUBAL LIGATION Bilateral 2005    Family History  Problem Relation Age of Onset  . Hypertension Mother   . Diabetes Father   . Hyperlipidemia Father   . Cancer Unknown   . Hypertension Maternal Grandmother   . Diabetes Paternal Grandmother   . Stroke Maternal Aunt 65  . Breast cancer Maternal Aunt 52  . Coronary artery disease Maternal Grandfather 67       MI, sudden death  . Lung cancer Maternal Uncle   . Colon cancer Maternal Uncle     Social History   Socioeconomic History  . Marital status: Divorced    Spouse name: Not on file  . Number of children: 3  . Years of education: Not on file  . Highest education level: Not on file  Occupational History  . 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 Financial resource strain: Not on file  . Food insecurity:    Worry: Not on file    Inability: Not on file  . Transportation needs:    Medical: Not on file    Non-medical: Not on file  Tobacco Use  . Smoking status: Never Smoker  . Smokeless tobacco: Never Used  Substance and Sexual Activity  . Alcohol  use: Yes    Comment: wine  . Drug use: No  . Sexual activity: Yes    Partners: Male    Birth control/protection: Surgical    Comment: Tubal Ligation   Lifestyle  . Physical activity:    Days per week: Not on file    Minutes per session: Not on file  . Stress: Not on file  Relationships  . Social connections:    Talks on phone: Not on file    Gets together: Not on file    Attends religious service: Not on file    Active member of club or organization: Not on file    Attends meetings of clubs or organizations: Not on file    Relationship status: Not on file  . Intimate partner violence:    Fear of current or ex partner: Not on file    Emotionally abused:  Not on file    Physically abused: Not on file    Forced sexual activity: Not on file  Other Topics Concern  . 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  No Known Allergies   ROS See HPI  Objective  Vitals:   06/14/17 1444  BP: (!) 150/104  Pulse: 90  Resp: 16  Temp: 98.6 F (37 C)  TempSrc: Oral  SpO2: 98%  Weight: 152 lb 12.8 oz (69.3 kg)  Height: 5\' 3"  (1.6 m)   Body mass index is 27.07 kg/m.  Physical Exam Vital signs reviewed. Constitutional: Patient appears well-developed and well-nourished. No distress.  HENT: Head: Normocephalic and atraumatic. Nose: Nose normal. Mouth/Throat: Oropharynx is clear and moist.  Eyes: Conjunctivae and EOM are normal. Pupils are equal, round, and reactive to light. No scleral icterus.  Neck: Normal range of motion. Neck supple. Cardiovascular: Normal rate, regular rhythm and normal heart sounds.  No murmur heard. No BLE edema. Distal pulses intact. Pulmonary/Chest: Effort normal and breath sounds normal. No respiratory distress. Neurological: She is alert and oriented to person, place, and time. No cranial nerve deficit. Coordination, balance, strength, speech and gait are normal.  Skin: Skin is warm and dry. Hyperpigmentation to bilateral forearms. No redness. Psychiatric: Patient has a normal mood and affect. behavior is normal. Judgment and thought content normal.   Assessment & Plan RTC in 2 weeks for F/U of HTN-starting amlodipine; Cough- starting flonase; GERD  Cough Will initiate flonase to treat possible allergies today with plan to follow up in 2 weeks-discussed dosing and side effects of flonase. If no improvement will consider treatment of acid reflux, chest xray - fluticasone (FLONASE) 50 MCG/ACT nasal spray; Place 2 sprays into both nostrils daily.  Dispense: 16 g;  Refill: 6   Discoloration of skin - Ambulatory referral to Dermatology

## 2017-06-14 NOTE — Assessment & Plan Note (Signed)
Discussed re-initiation of BP medication today for high readings in clinic and at home, will try amlodipine as she does not want to go back on losartan-dosing and side effects discussed Discussed the role of healthy diet and exercise in the management of HTN- See AVS for additional information provided to patient Encouraged to continue monitoring BP at home and RTC in 2 weeks for F/U - amLODipine (NORVASC) 5 MG tablet; Take 1 tablet (5 mg total) by mouth daily.  Dispense: 30 tablet; Refill: 1

## 2017-06-14 NOTE — Patient Instructions (Addendum)
For your blood pressure, I have sent a prescription for amlodipine 5 mg once daily to your pharmacy. Please try to check your blood pressure once daily or at least a few times a week, at the same time each day, and keep a log.  For your cough, I have sent a prescription for flonase 2 sprays in each nostril daily for 1 week, then reduce to 1 spray in each nostril day.  Please return in 2 weeks for follow up.   Mediterranean Diet A Mediterranean diet refers to food and lifestyle choices that are based on the traditions of countries located on the [employer] [place]. This way of eating has been shown to help prevent certain conditions and improve outcomes for people who have chronic diseases, like kidney disease and heart disease. What are tips for following this plan? Lifestyle  Cook and eat meals together with your family, when possible.  Drink enough fluid to keep your urine clear or pale yellow.  Be physically active every day. This includes: ? Aerobic exercise like running or swimming. ? Leisure activities like gardening, walking, or housework.  Get 7-8 hours of sleep each night.  If recommended by your health care provider, drink red wine in moderation. This means 1 glass a day for nonpregnant women and 2 glasses a day for men. A glass of wine equals 5 oz (150 mL). Reading food labels  Check the serving size of packaged foods. For foods such as rice and pasta, the serving size refers to the amount of cooked product, not dry.  Check the total fat in packaged foods. Avoid foods that have saturated fat or trans fats.  Check the ingredients list for added sugars, such as corn syrup. Shopping  At the grocery store, buy most of your food from the areas near the walls of the store. This includes: ? Fresh fruits and vegetables (produce). ? Grains, beans, nuts, and seeds. Some of these may be available in unpackaged forms or large amounts (in bulk). ? Fresh seafood. ? Poultry and  eggs. ? Low-fat dairy products.  Buy whole ingredients instead of prepackaged foods.  Buy fresh fruits and vegetables in-season from local farmers markets.  Buy frozen fruits and vegetables in resealable bags.  If you do not have access to quality fresh seafood, buy precooked frozen shrimp or canned fish, such as tuna, salmon, or sardines.  Buy small amounts of raw or cooked vegetables, salads, or olives from the deli or salad bar at your store.  Stock your pantry so you always have certain foods on hand, such as olive oil, canned tuna, canned tomatoes, rice, pasta, and beans. Cooking  Cook foods with extra-virgin olive oil instead of using butter or other vegetable oils.  Have meat as a side dish, and have vegetables or grains as your main dish. This means having meat in small portions or adding small amounts of meat to foods like pasta or stew.  Use beans or vegetables instead of meat in common dishes like chili or lasagna.  Experiment with different cooking methods. Try roasting or broiling vegetables instead of steaming or sauteing them.  Add frozen vegetables to soups, stews, pasta, or rice.  Add nuts or seeds for added healthy fat at each meal. You can add these to yogurt, salads, or vegetable dishes.  Marinate fish or vegetables using olive oil, lemon juice, garlic, and fresh herbs. Meal planning  Plan to eat 1 vegetarian meal one day each week. Try to work up to 2  vegetarian meals, if possible.  Eat seafood 2 or more times a week.  Have healthy snacks readily available, such as: ? Vegetable sticks with hummus. ? [place] yogurt. ? Fruit and nut trail mix.  Eat balanced meals throughout the week. This includes: ? Fruit: 2-3 servings a day ? Vegetables: 4-5 servings a day ? Low-fat dairy: 2 servings a day ? Fish, poultry, or lean meat: 1 serving a day ? Beans and legumes: 2 or more servings a week ? Nuts and seeds: 1-2 servings a day ? Whole grains: 6-8 servings a  day ? Extra-virgin olive oil: 3-4 servings a day  Limit red meat and sweets to only a few servings a month What are my food choices?  Mediterranean diet ? Recommended ? Grains: Whole-grain pasta. Brown rice. Bulgar wheat. Polenta. Couscous. Whole-wheat bread. [place] Cobbatmeal. Quinoa. ? Vegetables: Artichokes. Beets. Broccoli. Cabbage. Carrots. Eggplant. Green beans. Chard. Kale. Spinach. Onions. Leeks. Peas. Squash. Tomatoes. Peppers. Radishes. ? Fruits: Apples. Apricots. Avocado. Berries. Bananas. Cherries. Dates. Figs. Grapes. Lemons. Melon. Oranges. Peaches. Plums. Pomegranate. ? Meats and other protein foods: Beans. Almonds. Sunflower seeds. Pine nuts. Peanuts. Cod. Salmon. Scallops. Shrimp. Tuna. Tilapia. Clams. Oysters. Eggs. ? Dairy: Low-fat milk. Cheese. Greek yogurt. ? Beverages: Water. Red wine. Herbal tea. ? Fats and oils: Extra virgin olive oil. Avocado oil. Grape seed oil. ? Sweets and desserts: [place] yogurt with honey. Baked apples. Poached pears. Trail mix. ? Seasoning and other foods: Basil. Cilantro. Coriander. Cumin. Mint. Parsley. Sage. Rosemary. Tarragon. Garlic. Oregano. Thyme. Pepper. Balsalmic vinegar. Tahini. Hummus. Tomato sauce. Olives. Mushrooms. ? Limit these ? Grains: Prepackaged pasta or rice dishes. Prepackaged cereal with added sugar. ? Vegetables: Deep fried potatoes (french fries). ? Fruits: Fruit canned in syrup. ? Meats and other protein foods: Beef. Pork. Lamb. Poultry with skin. Hot dogs. Tomasa BlaseBacon. ? Dairy: Ice cream. Sour cream. Whole milk. ? Beverages: Juice. Sugar-sweetened soft drinks. Beer. Liquor and spirits. ? Fats and oils: Butter. Canola oil. Vegetable oil. Beef fat (tallow). Lard. ? Sweets and desserts: Cookies. Cakes. Pies. Candy. ? Seasoning and other foods: Mayonnaise. Premade sauces and marinades. ? The items listed may not be a complete list. Talk with your dietitian about what dietary choices are right for you. Summary  The Mediterranean diet  includes both food and lifestyle choices.  Eat a variety of fresh fruits and vegetables, beans, nuts, seeds, and whole grains.  Limit the amount of red meat and sweets that you eat.  Talk with your health care provider about whether it is safe for you to drink red wine in moderation. This means 1 glass a day for nonpregnant women and 2 glasses a day for men. A glass of wine equals 5 oz (150 mL). This information is not intended to replace advice given to you by your health care provider. Make sure you discuss any questions you have with your health care provider. Document Released: 10/29/2015 Document Revised: 12/01/2015 Document Reviewed: 10/29/2015 Elsevier Interactive Patient Education  [employer]2018 Elsevier Inc.

## 2017-06-27 ENCOUNTER — Encounter

## 2017-06-27 MED ORDER — AMLODIPINE 5 MG TAB
5 mg | ORAL_TABLET | ORAL | 3 refills | Status: DC
Start: 2017-06-27 — End: 2017-10-29

## 2017-06-27 NOTE — Telephone Encounter (Signed)
Writer called, two patient verifiers used for patient verification, name and DOB, Patient stated that she has been unable to get her Norvasc 5mg  tablet transerred from [facility] to her mail order company, she is requesting that it be transferred to mail order.      PCP: Jackey LogeShanahan, Kelley B, NP    Last appt: 01/10/2017  No future appointments.    Requested Prescriptions      No prescriptions requested or ordered in this encounter       Prior labs and Blood pressures:  BP Readings from Last 3 Encounters:   01/10/17 (!) 141/95   09/01/16 139/89   05/24/16 130/80     Lab Results   Component Value Date/Time    Sodium 142 01/10/2017 11:31 AM    Potassium 3.9 01/10/2017 11:31 AM    Chloride 101 01/10/2017 11:31 AM    CO2 25 01/10/2017 11:31 AM    Glucose 89 01/10/2017 11:31 AM    BUN 10 01/10/2017 11:31 AM    Creatinine 0.69 01/10/2017 11:31 AM    BUN/Creatinine ratio 14 01/10/2017 11:31 AM    GFR est AA 122 01/10/2017 11:31 AM    GFR est non-AA 105 01/10/2017 11:31 AM    Calcium 9.6 01/10/2017 11:31 AM     Lab Results   Component Value Date/Time    Hemoglobin A1c 5.2 01/10/2017 11:31 AM     Lab Results   Component Value Date/Time    Cholesterol, total 171 01/10/2017 11:31 AM    HDL Cholesterol 58 01/10/2017 11:31 AM    LDL, calculated 100 (H) 01/10/2017 11:31 AM    VLDL, calculated 13 01/10/2017 11:31 AM    Triglyceride 63 01/10/2017 11:31 AM     Lab Results   Component Value Date/Time    VITAMIN D, 25-HYDROXY 18.7 (L) 01/10/2017 11:31 AM       Lab Results   Component Value Date/Time    TSH 0.596 10/11/2013 02:35 PM

## 2017-06-28 ENCOUNTER — Ambulatory Visit: Payer: BLUE CROSS/BLUE SHIELD | Admitting: Family

## 2017-06-28 ENCOUNTER — Encounter: Payer: Self-pay | Admitting: Family

## 2017-06-28 VITALS — BP 136/82 | HR 99 | Temp 98.4°F | Ht 63.0 in | Wt 153.0 lb

## 2017-06-28 DIAGNOSIS — Z23 Encounter for immunization: Secondary | ICD-10-CM

## 2017-06-28 DIAGNOSIS — Z13220 Encounter for screening for lipoid disorders: Secondary | ICD-10-CM | POA: Diagnosis not present

## 2017-06-28 DIAGNOSIS — I10 Essential (primary) hypertension: Secondary | ICD-10-CM | POA: Diagnosis not present

## 2017-06-28 MED ORDER — AMLODIPINE BESYLATE 5 MG PO TABS: 5.0000 mg | ORAL_TABLET | Freq: Every day | ORAL | 1 refills | Status: DC

## 2017-06-28 NOTE — Progress Notes (Signed)
Ruth Schmidt is a 47 y.o. female with the following history as recorded in Epic[facility]:  Patient Active Problem List   Diagnosis Date Noted  . Hyperpigmentation 09/26/2013  . Anemia, iron deficiency 01/04/2013  . Hypertension   . Routine gynecological examination 07/24/2012    Current Outpatient Medications  Medication Sig Dispense Refill  . amLODipine (NORVASC) 5 MG tablet Take 1 tablet (5 mg total) by mouth daily. 90 tablet 1  . fluticasone (FLONASE) 50 MCG/ACT nasal spray Place 2 sprays into both nostrils daily. 16 g 6   No current facility-administered medications for this visit.     Allergies: Patient has no known allergies.  Past Medical History:  Diagnosis Date  . Abnormal Pap smear of cervix   . Hypertension     Past Surgical History:  Procedure Laterality Date  . CESAREAN SECTION    . TUBAL LIGATION Bilateral 2005    Family History  Problem Relation Age of Onset  . Hypertension Mother   . Diabetes Father   . Hyperlipidemia Father   . Cancer Unknown   . Hypertension Maternal Grandmother   . Diabetes Paternal Grandmother   . Stroke Maternal Aunt 65  . Breast cancer Maternal Aunt 68  . Coronary artery disease Maternal Grandfather 45       MI, sudden death  . Lung cancer Maternal Uncle   . Colon cancer Maternal Uncle     Social History   Tobacco Use  . Smoking status: Never Smoker  . Smokeless tobacco: Never Used  Substance Use Topics  . Alcohol use: Yes    Comment: wine    Subjective:  2 week follow-up on hypertension; recent start on Amlodipine 5 mg; tolerating very well; Denies any chest pain, shortness of breath, blurred vision or headache. Notes that cough has almost completely resolved since starting Flonase as well; Up to date on mammogram; scheduled to see her GYN in the next 2 weeks; not fasting this afternoon;   Objective:  Vitals:   06/28/17 1518  BP: 136/82  Pulse: 99  Temp: 98.4 F (36.9 C)  TempSrc: Oral  SpO2: 99%  Weight: 153 lb  (69.4 kg)  Height: '5\' 3"'$  (1.6 m)    General: Well developed, well nourished, in no acute distress  Skin : Warm and dry.  Head: Normocephalic and atraumatic  Eyes: Sclera and conjunctiva clear; pupils round and reactive to light; extraocular movements intact  Lungs: Respirations unlabored; clear to auscultation bilaterally without wheeze, rales, rhonchi  CVS exam: normal rate and regular rhythm.  Neurologic: Alert and oriented; speech intact; face symmetrical; moves all extremities well; CNII-XII intact without focal deficit  Assessment:  1. Essential hypertension   2. Screening cholesterol level     Plan:  1. Improved; continue Amlodipine 5 mg daily; refill updated; follow-up with her PCP in 4 months, sooner prn. 2. Return for fasting labs to include CBC, CMP, lipid panel;  Tdap updated; keep follow-up with GYN;   Return in about 4 months (around 10/28/2017), or with [facility] for blood pressure check.  Orders Placed This Encounter  Procedures  . Tdap vaccine greater than or equal to 7yo IM  . CBC w/Diff    Standing Status:   Future    Standing Expiration Date:   06/28/2018  . Comp Met (CMET)    Standing Status:   Future    Standing Expiration Date:   06/28/2018  . Lipid panel    Standing Status:   Future    Standing  Expiration Date:   06/29/2018    Requested Prescriptions   Signed Prescriptions Disp Refills  . amLODipine (NORVASC) 5 MG tablet 90 tablet 1    Sig: Take 1 tablet (5 mg total) by mouth daily.

## 2017-07-11 ENCOUNTER — Other Ambulatory Visit (INDEPENDENT_AMBULATORY_CARE_PROVIDER_SITE_OTHER): Payer: BLUE CROSS/BLUE SHIELD

## 2017-07-11 DIAGNOSIS — Z13220 Encounter for screening for lipoid disorders: Secondary | ICD-10-CM | POA: Diagnosis not present

## 2017-07-11 DIAGNOSIS — I10 Essential (primary) hypertension: Secondary | ICD-10-CM | POA: Diagnosis not present

## 2017-07-11 LAB — CBC WITH DIFFERENTIAL/PLATELET
BASOS ABS: 0 10*3/uL (ref 0.0–0.1)
Basophils Relative: 0.2 % (ref 0.0–3.0)
EOS ABS: 0 10*3/uL (ref 0.0–0.7)
Eosinophils Relative: 1.7 % (ref 0.0–5.0)
HCT: 34.8 % — ABNORMAL LOW (ref 36.0–46.0)
Hemoglobin: 11.3 g/dL — ABNORMAL LOW (ref 12.0–15.0)
LYMPHS ABS: 1 10*3/uL (ref 0.7–4.0)
Lymphocytes Relative: 38 % (ref 12.0–46.0)
MCHC: 32.4 g/dL (ref 30.0–36.0)
MCV: 93.2 fl (ref 78.0–100.0)
MONO ABS: 0.5 10*3/uL (ref 0.1–1.0)
Monocytes Relative: 19.5 % — ABNORMAL HIGH (ref 3.0–12.0)
NEUTROS ABS: 1.1 10*3/uL — AB (ref 1.4–7.7)
NEUTROS PCT: 40.6 % — AB (ref 43.0–77.0)
PLATELETS: 237 10*3/uL (ref 150.0–400.0)
RBC: 3.73 Mil/uL — ABNORMAL LOW (ref 3.87–5.11)
RDW: 15.2 % (ref 11.5–15.5)
WBC: 2.6 10*3/uL — ABNORMAL LOW (ref 4.0–10.5)

## 2017-07-11 LAB — COMPREHENSIVE METABOLIC PANEL
ALT: 26 U/L (ref 0–35)
AST: 24 U/L (ref 0–37)
Albumin: 3.9 g/dL (ref 3.5–5.2)
Alkaline Phosphatase: 45 U/L (ref 39–117)
BILIRUBIN TOTAL: 0.4 mg/dL (ref 0.2–1.2)
BUN: 8 mg/dL (ref 6–23)
CO2: 30 meq/L (ref 19–32)
CREATININE: 0.62 mg/dL (ref 0.40–1.20)
Calcium: 9.2 mg/dL (ref 8.4–10.5)
Chloride: 104 mEq/L (ref 96–112)
GFR: 133.18 mL/min (ref >60.00)
GLUCOSE: 89 mg/dL (ref 70–99)
Potassium: 3.8 mEq/L (ref 3.5–5.1)
Sodium: 138 mEq/L (ref 135–145)
TOTAL PROTEIN: 7.1 g/dL (ref 6.0–8.3)

## 2017-07-11 LAB — LIPID PANEL
CHOL/HDL RATIO: 3
Cholesterol: 175 mg/dL (ref 0–200)
HDL: 59 mg/dL (ref >39.00)
LDL Cholesterol: 102 mg/dL — ABNORMAL HIGH (ref 0–99)
NONHDL: 115.72
TRIGLYCERIDES: 71 mg/dL (ref 0.0–149.0)
VLDL: 14.2 mg/dL (ref 0.0–40.0)

## 2017-07-12 ENCOUNTER — Ambulatory Visit (INDEPENDENT_AMBULATORY_CARE_PROVIDER_SITE_OTHER): Payer: BLUE CROSS/BLUE SHIELD | Admitting: Obstetrics and Gynecology

## 2017-07-12 ENCOUNTER — Other Ambulatory Visit: Payer: Self-pay | Admitting: Family

## 2017-07-12 ENCOUNTER — Encounter: Payer: Self-pay | Admitting: Obstetrics and Gynecology

## 2017-07-12 VITALS — BP 151/88 | HR 88 | Temp 97.5°F | Resp 16 | Wt 154.1 lb

## 2017-07-12 DIAGNOSIS — N938 Other specified abnormal uterine and vaginal bleeding: Secondary | ICD-10-CM | POA: Diagnosis not present

## 2017-07-12 DIAGNOSIS — Z1151 Encounter for screening for human papillomavirus (HPV): Secondary | ICD-10-CM

## 2017-07-12 DIAGNOSIS — Z01419 Encounter for gynecological examination (general) (routine) without abnormal findings: Secondary | ICD-10-CM

## 2017-07-12 DIAGNOSIS — Z113 Encounter for screening for infections with a predominantly sexual mode of transmission: Secondary | ICD-10-CM | POA: Diagnosis not present

## 2017-07-12 DIAGNOSIS — Z124 Encounter for screening for malignant neoplasm of cervix: Secondary | ICD-10-CM | POA: Diagnosis not present

## 2017-07-12 DIAGNOSIS — D72819 Decreased white blood cell count, unspecified: Secondary | ICD-10-CM

## 2017-07-12 NOTE — Progress Notes (Signed)
Subjective:     Ruth Schmidt is a 47 y.o. female G3P3 with LMP 06/30/2017 and BMI 27.3 who is here for a comprehensive physical exam. The patient reports no problems. She is not currently sexually active. She reports a monthly period of 4-5 days with heavy flow on day 2 with passage of large clots. She had Mirena IUD placed in 10/2013. She denies any pelvic pain or abnormal discharge. She denies any urinary incontinence  Past Medical History:  Diagnosis Date  . Abnormal Pap smear of cervix   . Hypertension    Past Surgical History:  Procedure Laterality Date  . CESAREAN SECTION    . TUBAL LIGATION Bilateral 2005   Family History  Problem Relation Age of Onset  . Hypertension Mother   . Diabetes Father   . Hyperlipidemia Father   . Cancer Unknown   . Hypertension Maternal Grandmother   . Diabetes Paternal Grandmother   . Stroke Maternal Aunt 65  . Breast cancer Maternal Aunt [id number]  . Coronary artery disease Maternal Grandfather [id number]       MI, sudden death  . Lung cancer Maternal Uncle   . Colon cancer Maternal Uncle    Social History   Tobacco Use  . Smoking status: Never Smoker  . Smokeless tobacco: Never Used  Substance Use Topics  . Alcohol use: Yes    Comment: wine  . Drug use: No    Social History   Socioeconomic History  . Marital status: Divorced    Spouse name: Not on file  . Number of children: 3  . Years of education: Not on file  . Highest education level: Not on file  Occupational History  . Occupation: ChiropodistAssistant Director    Comment: Daycare   Social Needs  . Financial resource strain: Not on file  . Food insecurity:    Worry: Not on file    Inability: Not on file  . Transportation needs:    Medical: Not on file    Non-medical: Not on file  Tobacco Use  . Smoking status: Never Smoker  . Smokeless tobacco: Never Used  Substance and Sexual Activity  . Alcohol use: Yes    Comment: wine  . Drug use: No  . Sexual activity: Yes    Partners: Male   Birth control/protection: Surgical    Comment: Tubal Ligation   Lifestyle  . Physical activity:    Days per week: Not on file    Minutes per session: Not on file  . Stress: Not on file  Relationships  . Social connections:    Talks on phone: Not on file    Gets together: Not on file    Attends religious service: Not on file    Active member of club or organization: Not on file    Attends meetings of clubs or organizations: Not on file    Relationship status: Not on file  . Intimate partner violence:    Fear of current or ex partner: Not on file    Emotionally abused: Not on file    Physically abused: Not on file    Forced sexual activity: Not on file  Other Topics Concern  . 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 PAP SMEAR  07/24/2015  . INFLUENZA VACCINE  02/14/2018 (Originally 10/19/2017)  .  TETANUS/TDAP  06/29/2027  . HIV Screening  Completed       Review of Systems Pertinent items are noted in HPI.   Objective:  Blood pressure (!) 151/88, pulse 88, temperature (!) 97.5 F (36.4 C), temperature source Oral, resp. rate 16, weight 154 lb 1.6 oz (69.9 kg), last menstrual period 06/30/2017.     GENERAL: Well-developed, well-nourished female in no acute distress.  HEENT: Normocephalic, atraumatic. Sclerae anicteric.  NECK: Supple. Normal thyroid.  LUNGS: Clear to auscultation bilaterally.  HEART: Regular rate and rhythm. BREASTS: Symmetric in size. No palpable masses or lymphadenopathy, skin changes, or nipple drainage. ABDOMEN: Soft, nontender, nondistended. No organomegaly. PELVIC: Normal external female genitalia. Vagina is pink and rugated.  Normal discharge. Normal appearing cervix. IUD strings not visualized visualized. Uterus is normal in size. No adnexal mass or tenderness. EXTREMITIES: No cyanosis, clubbing, or edema,  2+ distal pulses.    Assessment:    Healthy female exam.      Plan:    Pap smear collected Patient with normal mammogram earlier this year Pelvic ultrasound ordered IUD removal 10/2018 Patient will be contacted with abnormal results  Follow up with PCP regarding CHTN See After Visit Summary for Counseling Recommendations

## 2017-07-13 LAB — CERVICOVAGINAL ANCILLARY ONLY
Bacterial vaginitis: POSITIVE — AB
CHLAMYDIA, DNA PROBE: NEGATIVE
Candida vaginitis: POSITIVE — AB
NEISSERIA GONORRHEA: NEGATIVE
Trichomonas: NEGATIVE

## 2017-07-14 MED ORDER — METRONIDAZOLE 500 MG PO TABS: 500.0000 mg | ORAL_TABLET | Freq: Two times a day (BID) | ORAL | 0 refills | Status: DC

## 2017-07-14 MED ORDER — FLUCONAZOLE 150 MG PO TABS: 150.0000 mg | ORAL_TABLET | Freq: Once | ORAL | 0 refills | Status: AC

## 2017-07-14 NOTE — Addendum Note (Signed)
Addended by: Catalina AntiguaONSTANT, Inna Tisdell on: 07/14/2017 07:31 AM   Modules accepted: Orders

## 2017-07-17 LAB — CYTOLOGY - PAP
DIAGNOSIS: NEGATIVE
HPV: NOT DETECTED

## 2017-07-18 ENCOUNTER — Ambulatory Visit
Admission: RE | Admit: 2017-07-18 | Discharge: 2017-07-18 | Disposition: A | Discharge status: Home / Self Care | Payer: BLUE CROSS/BLUE SHIELD | Source: Ambulatory Visit | Attending: Obstetrics and Gynecology | Admitting: Obstetrics and Gynecology

## 2017-07-18 DIAGNOSIS — N938 Other specified abnormal uterine and vaginal bleeding: Secondary | ICD-10-CM

## 2017-07-18 DIAGNOSIS — D259 Leiomyoma of uterus, unspecified: Secondary | ICD-10-CM | POA: Diagnosis not present

## 2017-08-08 ENCOUNTER — Encounter

## 2017-08-08 MED ORDER — VALACYCLOVIR 1 G TAB
1 gram | ORAL_TABLET | ORAL | 2 refills | Status: DC
Start: 2017-08-08 — End: 2018-05-11

## 2017-08-16 ENCOUNTER — Ambulatory Visit: Admit: 2017-08-16 | Discharge: 2017-08-16 | Payer: PRIVATE HEALTH INSURANCE | Attending: Family | Primary: Family

## 2017-08-16 ENCOUNTER — Ambulatory Visit: Attending: Family | Primary: Family

## 2017-08-16 DIAGNOSIS — R413 Other amnesia: Secondary | ICD-10-CM
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
## 2017-08-16 NOTE — Progress Notes (Signed)
Michelle Benitez  Identified pt with two pt identifiers(name and DOB).  Chief Complaint   Patient presents with   ??? Blood Pressure Check       1. Have you been to the ER, urgent care clinic since your last visit?  Hospitalized since your last visit? NO    2. Have you seen or consulted any other health care providers outside of the [facility] since your last visit?  Include any pap smears or colon screening. NO      Provider notified of reason for visit, vitals and flowsheets obtained on patients.     Patient received paperwork for advance directive during previous visit but has not completed at this time     Reviewed record In preparation for visit, huddled with provider and have obtained necessary documentation      Health Maintenance Due   Topic   ??? PAP AKA CERVICAL CYTOLOGY        Wt Readings from Last 3 Encounters:   08/16/17 168 lb (76.2 kg)   01/10/17 183 lb 12.8 oz (83.4 kg)   09/01/16 184 lb 8 oz (83.7 kg)     Temp Readings from Last 3 Encounters:   08/16/17 97.6 ??F (36.4 ??C) (Oral)   01/10/17 97.6 ??F (36.4 ??C) (Oral)   09/01/16 96.7 ??F (35.9 ??C) (Oral)     BP Readings from Last 3 Encounters:   08/16/17 133/82   01/10/17 (!) 141/95   09/01/16 139/89     Pulse Readings from Last 3 Encounters:   08/16/17 (!) 59   01/10/17 68   09/01/16 76     Vitals:    08/16/17 0849   BP: 133/82   Pulse: (!) 59   Resp: 18   Temp: 97.6 ??F (36.4 ??C)   TempSrc: Oral   SpO2: 98%   Weight: 168 lb (76.2 kg)   Height:  (1.702 m)   PainSc:   0 - No pain         Learning Assessment:  :     Learning Assessment 09/05/2013   PRIMARY LEARNER Patient   HIGHEST LEVEL OF EDUCATION - PRIMARY LEARNER  > 4 YEARS OF COLLEGE   BARRIERS PRIMARY LEARNER NONE   CO-LEARNER CAREGIVER No   CO-LEARNER NAME n/a   PRIMARY LANGUAGE ENGLISH   LEARNER PREFERENCE PRIMARY READING   ANSWERED BY patient   RELATIONSHIP SELF       Depression Screening:  :     3 most recent PHQ Screens 08/16/2017    Little interest or pleasure in doing things Several days   Feeling down, depressed, irritable, or hopeless Several days   Total Score PHQ 2 2   Trouble falling or staying asleep, or sleeping too much Not at all   Feeling tired or having little energy Several days   Poor appetite, weight loss, or overeating Not at all   Feeling bad about yourself - or that you are a failure or have let yourself or your family down Not at all   Trouble concentrating on things such as school, work, reading, or watching TV Not at all   Moving or speaking so slowly that other people could have noticed; or the opposite being so fidgety that others notice Not at all   Thoughts of being better off dead, or hurting yourself in some way Not at all   PHQ 9 Score 3   How difficult have these problems made it for you to do your  work, take care of your home and get along with others Not difficult at all         Abuse Screening:  :     Abuse Screening Questionnaire 01/10/2017 05/24/2016   Do you ever feel afraid of your partner? N N   Are you in a relationship with someone who physically or mentally threatens you? N N   Is it safe for you to go home? Y Y       ADL Screening:  :     ADL Assessment 05/24/2016   Feeding yourself No Help Needed   Getting from bed to chair No Help Needed   Getting dressed No Help Needed   Bathing or showering No Help Needed   Walk across the room (includes cane/walker) No Help Needed   Using the telphone No Help Needed   Taking your medications No Help Needed   Preparing meals No Help Needed   Managing money (expenses/bills) No Help Needed   Moderately strenuous housework (laundry) No Help Needed   Shopping for personal items (toiletries/medicines) No Help Needed   Shopping for groceries No Help Needed   Driving No Help Needed   Climbing a flight of stairs No Help Needed   Getting to places beyond walking distances No Help Needed         Medication reconciliation up to date and corrected with patient at this time.

## 2017-08-16 NOTE — Progress Notes (Signed)
 Michelle Benitez  Identified pt with two pt identifiers(name and DOB).  Chief Complaint   Patient presents with   . Blood Pressure Check       1. Have you been to the ER, urgent care clinic since your last visit?  Hospitalized since your last visit? NO    2. Have you seen or consulted any other health care providers outside of the [facility] since your last visit?  Include any pap smears or colon screening. NO      Provider notified of reason for visit, vitals and flowsheets obtained on patients.     Patient received paperwork for advance directive during previous visit but has not completed at this time     Reviewed record In preparation for visit, huddled with provider and have obtained necessary documentation      Health Maintenance Due   Topic   . PAP AKA CERVICAL CYTOLOGY        Wt Readings from Last 3 Encounters:   08/16/17 168 lb (76.2 kg)   01/10/17 183 lb 12.8 oz (83.4 kg)   09/01/16 184 lb 8 oz (83.7 kg)     Temp Readings from Last 3 Encounters:   08/16/17 97.6 F (36.4 C) (Oral)   01/10/17 97.6 F (36.4 C) (Oral)   09/01/16 96.7 F (35.9 C) (Oral)     BP Readings from Last 3 Encounters:   08/16/17 133/82   01/10/17 (!) 141/95   09/01/16 139/89     Pulse Readings from Last 3 Encounters:   08/16/17 (!) 59   01/10/17 68   09/01/16 76     Vitals:    08/16/17 0849   BP: 133/82   Pulse: (!) 59   Resp: 18   Temp: 97.6 F (36.4 C)   TempSrc: Oral   SpO2: 98%   Weight: 168 lb (76.2 kg)   Height: 5' 7 (1.702 m)   PainSc:   0 - No pain         Learning Assessment:  :     Learning Assessment 09/05/2013   PRIMARY LEARNER Patient   HIGHEST LEVEL OF EDUCATION - PRIMARY LEARNER  > 4 YEARS OF COLLEGE   BARRIERS PRIMARY LEARNER NONE   CO-LEARNER CAREGIVER No   CO-LEARNER NAME n/a   PRIMARY LANGUAGE ENGLISH   LEARNER PREFERENCE PRIMARY READING   ANSWERED BY patient   RELATIONSHIP SELF       Depression Screening:  :     3 most recent PHQ Screens 08/16/2017   Little interest or pleasure in doing things Several  days   Feeling down, depressed, irritable, or hopeless Several days   Total Score PHQ 2 2   Trouble falling or staying asleep, or sleeping too much Not at all   Feeling tired or having little energy Several days   Poor appetite, weight loss, or overeating Not at all   Feeling bad about yourself - or that you are a failure or have let yourself or your family down Not at all   Trouble concentrating on things such as school, work, reading, or watching TV Not at all   Moving or speaking so slowly that other people could have noticed; or the opposite being so fidgety that others notice Not at all   Thoughts of being better off dead, or hurting yourself in some way Not at all   PHQ 9 Score 3   How difficult have these problems made it for you to do your  work, take care of your home and get along with others Not difficult at all         Abuse Screening:  :     Abuse Screening Questionnaire 01/10/2017 05/24/2016   Do you ever feel afraid of your partner? N N   Are you in a relationship with someone who physically or mentally threatens you? N N   Is it safe for you to go home? Y Y       ADL Screening:  :     ADL Assessment 05/24/2016   Feeding yourself No Help Needed   Getting from bed to chair No Help Needed   Getting dressed No Help Needed   Bathing or showering No Help Needed   Walk across the room (includes cane/walker) No Help Needed   Using the telphone No Help Needed   Taking your medications No Help Needed   Preparing meals No Help Needed   Managing money (expenses/bills) No Help Needed   Moderately strenuous housework (laundry) No Help Needed   Shopping for personal items (toiletries/medicines) No Help Needed   Shopping for groceries No Help Needed   Driving No Help Needed   Climbing a flight of stairs No Help Needed   Getting to places beyond walking distances No Help Needed         Medication reconciliation up to date and corrected with patient at this time.
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
## 2017-09-08 ENCOUNTER — Other Ambulatory Visit (INDEPENDENT_AMBULATORY_CARE_PROVIDER_SITE_OTHER): Payer: BLUE CROSS/BLUE SHIELD

## 2017-09-08 DIAGNOSIS — D72819 Decreased white blood cell count, unspecified: Secondary | ICD-10-CM

## 2017-09-08 LAB — CBC WITH DIFFERENTIAL/PLATELET
BASOS ABS: 0 10*3/uL (ref 0.0–0.1)
Basophils Relative: 0.3 % (ref 0.0–3.0)
Eosinophils Absolute: 0 10*3/uL (ref 0.0–0.7)
Eosinophils Relative: 0.5 % (ref 0.0–5.0)
HCT: 33.3 % — ABNORMAL LOW (ref 36.0–46.0)
Hemoglobin: 11 g/dL — ABNORMAL LOW (ref 12.0–15.0)
LYMPHS ABS: 1.3 10*3/uL (ref 0.7–4.0)
Lymphocytes Relative: 33.1 % (ref 12.0–46.0)
MCHC: 33 g/dL (ref 30.0–36.0)
MCV: 91.3 fl (ref 78.0–100.0)
MONO ABS: 0.5 10*3/uL (ref 0.1–1.0)
Monocytes Relative: 13.5 % — ABNORMAL HIGH (ref 3.0–12.0)
NEUTROS ABS: 2 10*3/uL (ref 1.4–7.7)
NEUTROS PCT: 52.6 % (ref 43.0–77.0)
PLATELETS: 262 10*3/uL (ref 150.0–400.0)
RBC: 3.64 Mil/uL — AB (ref 3.87–5.11)
RDW: 14.6 % (ref 11.5–15.5)
WBC: 3.8 10*3/uL — ABNORMAL LOW (ref 4.0–10.5)

## 2017-09-28 DIAGNOSIS — L308 Other specified dermatitis: Secondary | ICD-10-CM | POA: Diagnosis not present

## 2017-09-28 DIAGNOSIS — R21 Rash and other nonspecific skin eruption: Secondary | ICD-10-CM | POA: Diagnosis not present

## 2017-09-28 DIAGNOSIS — L818 Other specified disorders of pigmentation: Secondary | ICD-10-CM | POA: Diagnosis not present

## 2017-10-06 ENCOUNTER — Ambulatory Visit: Admit: 2017-10-06 | Payer: PRIVATE HEALTH INSURANCE | Attending: Neurology | Primary: Family

## 2017-10-06 ENCOUNTER — Ambulatory Visit: Attending: Neurology | Primary: Family

## 2017-10-06 DIAGNOSIS — R413 Other amnesia: Secondary | ICD-10-CM

## 2017-10-06 NOTE — Progress Notes (Signed)
Ms. Dedmon presents as a new patient for evaluation of memory loss. Her symptoms began two months ago. Depression screening done on this patient.

## 2017-10-06 NOTE — Progress Notes (Signed)
I left a message for patient to call me back.

## 2017-10-06 NOTE — Patient Instructions (Signed)
PRESCRIPTION REFILL POLICY    [facility]   Statement to Patients  June 19, 2012      In an effort to ensure the large volume of patient prescription refills is processed in the most efficient and expeditious manner, we are asking our patients to assist us by calling your Pharmacy for all prescription refills, this will include also your  Mail Order Pharmacy. The pharmacy will contact our office electronically to continue the refill process.    Please do not wait until the last minute to call your pharmacy. We need at least 48 hours (2days) to fill prescriptions. We also encourage you to call your pharmacy before going to pick up your prescription to make sure it is ready.     With regard to controlled substance prescription refill requests (narcotic refills) that need to be picked up at our office, we ask your cooperation by providing us with at least 72 hours (3days) notice that you will need a refill.    We will not refill narcotic prescription refill requests after 4:00pm on any weekday, Monday through Thursday, or after 2:00pm on Fridays, or on the weekends.      We encourage everyone to explore another way of getting your prescription refill request processed using MyChart, our patient web portal through our electronic medical record system. MyChart is an efficient and effective way to communicate your medication request directly to the office and  downloadable as an app on your smart phone . MyChart also features a review functionality that allows you to view your medication list as well as leave messages for your physician. Are you ready to get connected? If so please review the attatched instructions or speak to any of our staff to get you set up right away!    Thank you so much for your cooperation. Should you have any questions please contact our Practice Administrator.    The Physicians and Staff,  [facility]
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
## 2017-10-06 NOTE — Progress Notes (Signed)
I gave patient this information. She expressed understanding.

## 2017-10-06 NOTE — Progress Notes (Signed)
Joe - Please call pt:  B12/MMA look good, continue current replacement therapy.  Vit D level is better, but still low - 28.1, recommend she take an OTC Vit D3 supplement 1000units/day.  TSH looks good.
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
## 2017-10-06 NOTE — Progress Notes (Signed)
Joe - Please call pt:  B12/MMA look good, continue current replacement therapy.  Vit D level is better, but still low - 28.1, recommend she take an OTC Vit D3 supplement 1000units/day.  TSH looks good.

## 2017-10-06 NOTE — Progress Notes (Signed)
Ms. Michelle Benitez presents as a new patient for evaluation of memory loss. Her symptoms began two months ago. Depression screening done on this patient.

## 2017-10-09 LAB — VITAMIN D, 25 HYDROXY: VITAMIN D, 25-HYDROXY: 28.1 ng/mL — ABNORMAL LOW (ref 30.0–100.0)

## 2017-10-09 LAB — METHYLMALONIC ACID: METHYLMALONIC ACID, SERUM: 156 nmol/L (ref 0–378)

## 2017-10-09 LAB — RPR
RPR: NONREACTIVE
RPR: NONREACTIVE

## 2017-10-09 LAB — VITAMIN B12
Vitamin B-12: 413 pg/mL (ref 232–1245)
Vitamin B12: 413 pg/mL (ref 232–1245)

## 2017-10-09 LAB — TSH 3RD GENERATION
TSH: 0.911 u[IU]/mL (ref 0.450–4.500)
TSH: 0.911 u[IU]/mL (ref 0.450–4.500)

## 2017-10-09 LAB — ONCOTYPE DX DCIS: METHYLMALONIC ACID, SERUM: 156 nmol/L (ref 0–378)

## 2017-10-09 LAB — VITAMIN D 25 HYDROXY: Vit D, 25-Hydroxy: 28.1 ng/mL — ABNORMAL LOW (ref 30.0–100.0)

## 2017-10-11 NOTE — Telephone Encounter (Signed)
Patient returning call to Joe, LPN.    Best # [phone]

## 2017-10-11 NOTE — Telephone Encounter (Signed)
I reviewed labs with patient, per Dr. Alison Murrayansom. (see notes)

## 2017-10-27 ENCOUNTER — Inpatient Hospital Stay: Admit: 2017-10-27 | Payer: PRIVATE HEALTH INSURANCE | Primary: Family

## 2017-10-27 ENCOUNTER — Ambulatory Visit: Payer: PRIVATE HEALTH INSURANCE | Primary: Family

## 2017-10-27 ENCOUNTER — Encounter: Payer: Self-pay | Admitting: Nurse Practitioner

## 2017-10-27 ENCOUNTER — Ambulatory Visit: Payer: BLUE CROSS/BLUE SHIELD | Admitting: Nurse Practitioner

## 2017-10-27 ENCOUNTER — Other Ambulatory Visit (INDEPENDENT_AMBULATORY_CARE_PROVIDER_SITE_OTHER): Payer: BLUE CROSS/BLUE SHIELD

## 2017-10-27 VITALS — BP 132/80 | HR 91 | Temp 98.5°F | Resp 16 | Ht 63.0 in | Wt 150.8 lb

## 2017-10-27 DIAGNOSIS — R5383 Other fatigue: Secondary | ICD-10-CM | POA: Diagnosis not present

## 2017-10-27 DIAGNOSIS — I10 Essential (primary) hypertension: Secondary | ICD-10-CM

## 2017-10-27 DIAGNOSIS — R413 Other amnesia: Secondary | ICD-10-CM

## 2017-10-27 LAB — VITAMIN B12: Vitamin B-12: 996 pg/mL — ABNORMAL HIGH (ref 211–911)

## 2017-10-27 LAB — TSH: TSH: 1.74 u[IU]/mL (ref 0.35–4.50)

## 2017-10-27 LAB — HEMOGLOBIN A1C: HEMOGLOBIN A1C: 5.3 % (ref 4.6–6.5)

## 2017-10-27 LAB — VITAMIN D 25 HYDROXY (VIT D DEFICIENCY, FRACTURES): VITD: 15.81 ng/mL — ABNORMAL LOW (ref 30.00–100.00)

## 2017-10-27 MED ORDER — AMLODIPINE BESYLATE 5 MG PO TABS: 5.0000 mg | ORAL_TABLET | Freq: Every day | ORAL | 1 refills | Status: DC

## 2017-10-27 NOTE — Patient Instructions (Signed)
Please head downstairs for lab work. If any of your test results are critically abnormal, you will be contacted right away. Otherwise, I will contact you within a week about your test results and follow up recommendations  I have placed a referral to pulmonology for sleep evaluation. Our office will begin processing this referral. Please follow up if you have not heard anything about this referral within 10 days.  I will plan to see you back in about 6 months for annual physical, or sooner if needed.   Fatigue Fatigue is feeling tired all of the time, a lack of energy, or a lack of motivation. Occasional or mild fatigue is often a normal response to activity or life in general. However, long-lasting (chronic) or extreme fatigue may indicate an underlying medical condition. Follow these instructions at home: Watch your fatigue for any changes. The following actions may help to lessen any discomfort you are feeling:  Talk to your health care provider about how much sleep you need each night. Try to get the required amount every night.  Take medicines only as directed by your health care provider.  Eat a healthy and nutritious diet. Ask your health care provider if you need help changing your diet.  Drink enough fluid to keep your urine clear or pale yellow.  Practice ways of relaxing, such as yoga, meditation, massage therapy, or acupuncture.  Exercise regularly.  Change situations that cause you stress. Try to keep your work and personal routine reasonable.  Do not abuse illegal drugs.  Limit alcohol intake to no more than 1 drink per day for nonpregnant women and 2 drinks per day for men. One drink equals 12 ounces of beer, 5 ounces of wine, or 1 ounces of hard liquor.  Take a multivitamin, if directed by your health care provider.  Contact a health care provider if:  Your fatigue does not get better.  You have a fever.  You have unintentional weight loss or gain.  You have  headaches.  You have difficulty: ? Falling asleep. ? Sleeping throughout the night.  You feel angry, guilty, anxious, or sad.  You are unable to have a bowel movement (constipation).  You skin is dry.  Your legs or another part of your body is swollen. Get help right away if:  You feel confused.  Your vision is blurry.  You feel faint or pass out.  You have a severe headache.  You have severe abdominal, pelvic, or back pain.  You have chest pain, shortness of breath, or an irregular or fast heartbeat.  You are unable to urinate or you urinate less than normal.  You develop abnormal bleeding, such as bleeding from the rectum, vagina, nose, lungs, or nipples.  You vomit blood.  You have thoughts about harming yourself or committing suicide.  You are worried that you might harm someone else. This information is not intended to replace advice given to you by your health care provider. Make sure you discuss any questions you have with your health care provider. Document Released: 01/02/2007 Document Revised: 08/13/2015 Document Reviewed: 07/09/2013 Elsevier Interactive Patient Education  [employer]2018 Elsevier Inc.

## 2017-10-27 NOTE — Assessment & Plan Note (Signed)
Stable Continue amlodipine at current dosage RTC in 6 months for F/U - amLODipine (NORVASC) 5 MG tablet; Take 1 tablet (5 mg total) by mouth daily.  Dispense: 90 tablet; Refill: 1

## 2017-10-27 NOTE — Progress Notes (Signed)
Name: Ruth Schmidt   MRN: [id number]    DOB: 1970-12-25   Date:10/27/2017       Progress Note  Subjective  Chief Complaint  Chief Complaint  Patient presents with  . Follow-up    blood pressure    HPI Ruth Schmidt is here today for follow up of hypertension, started on amlodipine 5 daily around March of this year for elevated blood pressure. We will also discuss a new complaint of fatigue. Reports daily medication compliance to amlodipine without noted adverse medication effects. Reports she does not routinely check her blood pressure readings at home. Reports she overall feels well  but has noticed recently feeling more fatigued, says "I just dont have the energy I used to have." she says every day after lunchtime  she begins to feel tired and this lasts for the rest of the day. She admits she does not routinely watch her diet or exercise but is very active in her daily life. She does report snoring at night, often wakes feeling like she has not rested, but does sleep about 6-8 hrs a night. She works at a school as a Neurosurgeon. Denies syncope, headaches, vision changes, chest pain, shortness of breath, edema.   BP Readings from Last 3 Encounters:  10/27/17 132/80  07/12/17 (!) 151/88  06/28/17 136/82    Patient Active Problem List   Diagnosis Date Noted  . Hyperpigmentation 09/26/2013  . Anemia, iron deficiency 01/04/2013  . Hypertension   . Routine gynecological examination 07/24/2012    Past Surgical History:  Procedure Laterality Date  . CESAREAN SECTION    . TUBAL LIGATION Bilateral 2005    Family History  Problem Relation Age of Onset  . Hypertension Mother   . Diabetes Father   . Hyperlipidemia Father   . Cancer Unknown   . Hypertension Maternal Grandmother   . Diabetes Paternal Grandmother   . Stroke Maternal Aunt 65  . Breast cancer Maternal Aunt 71  . Coronary artery disease Maternal Grandfather 25       MI, sudden death  . Lung cancer Maternal Uncle   .  Colon cancer Maternal Uncle     Social History   Socioeconomic History  . Marital status: Divorced    Spouse name: Not on file  . Number of children: 3  . Years of education: Not on file  . Highest education level: Not on file  Occupational History  . 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 Financial resource strain: Not on file  . Food insecurity:    Worry: Not on file    Inability: Not on file  . Transportation needs:    Medical: Not on file    Non-medical: Not on file  Tobacco Use  . Smoking status: Never Smoker  . Smokeless tobacco: Never Used  Substance and Sexual Activity  . Alcohol use: Yes    Comment: wine  . Drug use: No  . Sexual activity: Yes    Partners: Male    Birth control/protection: Surgical    Comment: Tubal Ligation   Lifestyle  . Physical activity:    Days per week: Not on file    Minutes per session: Not on file  . Stress: Not on file  Relationships  . Social connections:    Talks on phone: Not on file    Gets together: Not on file    Attends religious service: Not on file    Active member  of club or organization: Not on file    Attends meetings of clubs or organizations: Not on file    Relationship status: Not on file  . Intimate partner violence:    Fear of current or ex partner: Not on file    Emotionally abused: Not on file    Physically abused: Not on file    Forced sexual activity: Not on file  Other Topics Concern  . 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  amLODipine (NORVASC) 5 MG tablet, Take 1 tablet (5 mg total) by mouth daily., Disp: 90 tablet, Rfl: 1 .  fluticasone (FLONASE) 50 MCG/ACT nasal spray, Place 2 sprays into both nostrils daily., Disp: 16 g, Rfl: 6 .  metroNIDAZOLE (FLAGYL) 500 MG tablet, Take 1 tablet (500 mg total) by mouth 2 (two) times  daily., Disp: 14 tablet, Rfl: 0  No Known Allergies   ROS See HPI  Objective  Vitals:   10/27/17 1506  BP: 132/80  Pulse: 91  Resp: 16  Temp: 98.5 F (36.9 C)  TempSrc: Oral  SpO2: 98%  Weight: 150 lb 12.8 oz (68.4 kg)  Height: 5\' 3"  (1.6 m)    Body mass index is 26.71 kg/m.  Physical Exam Vital signs reviewed. Constitutional: Patient appears well-developed and well-nourished. No distress.  HENT: Head: Normocephalic and atraumatic. Nose: Nose normal. Mouth/Throat: Oropharynx is clear and moist. No oropharyngeal exudate.  Eyes: Conjunctivae and EOM are normal. Pupils are equal, round, and reactive to light. No scleral icterus.  Neck: Normal range of motion. Neck supple.  Cardiovascular: Normal rate, regular rhythm and normal heart sounds.  No murmur heard. No BLE edema. Distal pulses intact.  Pulmonary/Chest: Effort normal and breath sounds normal. No respiratory distress. Neurological: She is alert and oriented to person, place, and time. No cranial nerve deficit. Coordination, balance, strength, speech and gait are normal.  Skin: Skin is warm and dry. No rash noted. No erythema.  Psychiatric: Patient has a normal mood and affect. behavior is normal. Judgment and thought content normal.   Assessment & Plan RTC in 6 months for CPE  -Reviewed Health Maintenance: up to date   Other fatigue Mild anemia noted on last CBC 09/08/17, CMET WNL 07/11/17 Will order additional labs today and refer to pulmonology for sleep study F/U with further recommendations pending lab results Home management of fatigue, red flags and return precautions discussed and printed on AVS - Vitamin B12; Future - VITAMIN D 25 Hydroxy (Vit-D Deficiency, Fractures); Future - TSH; Future - Hemoglobin A1c; Future - Ambulatory referral to Pulmonology

## 2017-10-27 NOTE — Progress Notes (Signed)
I gave patient this information. She expressed understanding.

## 2017-10-27 NOTE — Progress Notes (Signed)
 I gave patient this information. She expressed understanding.

## 2017-10-27 NOTE — Progress Notes (Signed)
Joe - Please call pt: MRI brain wo contrast 10/27/17 is unremarkable. Awaiting EEG.

## 2017-10-29 ENCOUNTER — Encounter

## 2017-10-30 ENCOUNTER — Other Ambulatory Visit: Payer: Self-pay | Admitting: Family

## 2017-10-30 MED ORDER — INDAPAMIDE 1.25 MG TAB
1.25 mg | ORAL_TABLET | ORAL | 3 refills | Status: DC
Start: 2017-10-30 — End: 2017-11-06

## 2017-10-30 MED ORDER — AMLODIPINE 5 MG TAB
5 mg | ORAL_TABLET | ORAL | 3 refills | Status: DC
Start: 2017-10-30 — End: 2017-11-06

## 2017-10-30 MED ORDER — VITAMIN D (ERGOCALCIFEROL) 1.25 MG (50000 UNIT) PO CAPS: 50000.0000 [IU] | ORAL_CAPSULE | ORAL | 0 refills | Status: AC

## 2017-10-30 NOTE — Progress Notes (Signed)
Michelle Benitez - Please call pt: MRI brain wo contrast 10/27/17 is unremarkable. Awaiting EEG.

## 2017-10-30 NOTE — Telephone Encounter (Signed)
PCP: Michelle Benitez, Kelley B, NP    Last appt: 08/16/2017  Future Appointments   Date Time Provider Department Center   02/13/2018  8:00 AM Michelle Benitez, Kelley B, NP [facility] SCHED   03/07/2018  8:40 AM Jodi Mourningansom, Mary M, MD [facility] SCHED       Requested Prescriptions     Pending Prescriptions Disp Refills   ??? indapamide (LOZOL) 1.25 mg tablet 90 Tab 3     Sig: TAKE 1 TABLET BY MOUTH DAILY  Indications: high blood pressure   ??? amLODIPine (NORVASC) 5 mg tablet 90 Tab 3     Sig: TAKE 1 TABLET BY MOUTH EVERY DAY  Indications: high blood pressure       Prior labs and Blood pressures:  BP Readings from Last 3 Encounters:   10/06/17 112/70   08/16/17 133/82   01/10/17 (!) 141/95     Lab Results   Component Value Date/Time    Sodium 142 01/10/2017 11:31 AM    Potassium 3.9 01/10/2017 11:31 AM    Chloride 101 01/10/2017 11:31 AM    CO2 25 01/10/2017 11:31 AM    Glucose 89 01/10/2017 11:31 AM    BUN 10 01/10/2017 11:31 AM    Creatinine 0.69 01/10/2017 11:31 AM    BUN/Creatinine ratio 14 01/10/2017 11:31 AM    GFR est AA 122 01/10/2017 11:31 AM    GFR est non-AA 105 01/10/2017 11:31 AM    Calcium 9.6 01/10/2017 11:31 AM     Lab Results   Component Value Date/Time    Hemoglobin A1c 5.2 01/10/2017 11:31 AM     Lab Results   Component Value Date/Time    Cholesterol, total 171 01/10/2017 11:31 AM    HDL Cholesterol 58 01/10/2017 11:31 AM    LDL, calculated 100 (H) 01/10/2017 11:31 AM    VLDL, calculated 13 01/10/2017 11:31 AM    Triglyceride 63 01/10/2017 11:31 AM     Lab Results   Component Value Date/Time    VITAMIN D, 25-HYDROXY 28.1 (L) 10/06/2017 02:16 PM       Lab Results   Component Value Date/Time    TSH 0.911 10/06/2017 02:16 PM

## 2017-11-06 ENCOUNTER — Encounter

## 2017-11-06 NOTE — Telephone Encounter (Signed)
Requested Prescriptions     Pending Prescriptions Disp Refills   ??? amLODIPine (NORVASC) 5 mg tablet 90 Tab 3     Sig: TAKE 1 TABLET BY MOUTH EVERY DAY  Indications: high blood pressure   ??? indapamide (LOZOL) 1.25 mg tablet 90 Tab 3     Sig: TAKE 1 TABLET BY MOUTH DAILY  Indications: high blood pressure   [employer] called in today in reference to patient's medicine being lost/ stolen and would like for a refill to be sent to her local [facility] , patient normally do mail order but since she is low on medicine would prefer something quicker.   P: [phone] reference # 11/06/17

## 2017-11-07 MED ORDER — AMLODIPINE 5 MG TAB
5 mg | ORAL_TABLET | ORAL | 3 refills | Status: DC
Start: 2017-11-07 — End: 2017-11-13

## 2017-11-07 MED ORDER — INDAPAMIDE 1.25 MG TAB
1.25 mg | ORAL_TABLET | ORAL | 3 refills | Status: DC
Start: 2017-11-07 — End: 2017-11-13

## 2017-11-07 NOTE — Telephone Encounter (Signed)
PCP: Jackey LogeShanahan, Kelley B, NP    Last appt: 08/16/2017  Future Appointments   Date Time Provider Department Center   02/13/2018  8:00 AM Jackey LogeShanahan, Kelley B, NP [facility] SCHED   03/07/2018  8:40 AM Jodi Mourningansom, Mary M, MD [facility] SCHED       Requested Prescriptions     Pending Prescriptions Disp Refills   ??? amLODIPine (NORVASC) 5 mg tablet 90 Tab 3     Sig: TAKE 1 TABLET BY MOUTH EVERY DAY  Indications: high blood pressure   ??? indapamide (LOZOL) 1.25 mg tablet 90 Tab 3     Sig: TAKE 1 TABLET BY MOUTH DAILY  Indications: high blood pressure       Prior labs and Blood pressures:  BP Readings from Last 3 Encounters:   10/06/17 112/70   08/16/17 133/82   01/10/17 (!) 141/95     Lab Results   Component Value Date/Time    Sodium 142 01/10/2017 11:31 AM    Potassium 3.9 01/10/2017 11:31 AM    Chloride 101 01/10/2017 11:31 AM    CO2 25 01/10/2017 11:31 AM    Glucose 89 01/10/2017 11:31 AM    BUN 10 01/10/2017 11:31 AM    Creatinine 0.69 01/10/2017 11:31 AM    BUN/Creatinine ratio 14 01/10/2017 11:31 AM    GFR est AA 122 01/10/2017 11:31 AM    GFR est non-AA 105 01/10/2017 11:31 AM    Calcium 9.6 01/10/2017 11:31 AM     Lab Results   Component Value Date/Time    Hemoglobin A1c 5.2 01/10/2017 11:31 AM     Lab Results   Component Value Date/Time    Cholesterol, total 171 01/10/2017 11:31 AM    HDL Cholesterol 58 01/10/2017 11:31 AM    LDL, calculated 100 (H) 01/10/2017 11:31 AM    VLDL, calculated 13 01/10/2017 11:31 AM    Triglyceride 63 01/10/2017 11:31 AM     Lab Results   Component Value Date/Time    VITAMIN D, 25-HYDROXY 28.1 (L) 10/06/2017 02:16 PM       Lab Results   Component Value Date/Time    TSH 0.911 10/06/2017 02:16 PM

## 2017-11-13 ENCOUNTER — Encounter

## 2017-11-13 MED ORDER — INDAPAMIDE 1.25 MG TAB
1.25 mg | ORAL_TABLET | ORAL | 3 refills | Status: DC
Start: 2017-11-13 — End: 2018-01-21

## 2017-11-13 MED ORDER — AMLODIPINE 5 MG TAB
5 mg | ORAL_TABLET | ORAL | 3 refills | Status: DC
Start: 2017-11-13 — End: 2018-01-21

## 2017-11-13 NOTE — Telephone Encounter (Signed)
PCP: Jackey LogeShanahan, Kelley B, NP    Last appt: 08/16/2017  Future Appointments   Date Time Provider Department Center   02/13/2018  8:00 AM Jackey LogeShanahan, Kelley B, NP [facility] SCHED   03/07/2018  8:40 AM Jodi Mourningansom, Mary M, MD [facility] SCHED       Requested Prescriptions     Pending Prescriptions Disp Refills   ??? amLODIPine (NORVASC) 5 mg tablet 90 Tab 3     Sig: TAKE 1 TABLET BY MOUTH EVERY DAY  Indications: high blood pressure   ??? indapamide (LOZOL) 1.25 mg tablet 90 Tab 3     Sig: TAKE 1 TABLET BY MOUTH DAILY  Indications: high blood pressure       Prior labs and Blood pressures:  BP Readings from Last 3 Encounters:   10/06/17 112/70   08/16/17 133/82   01/10/17 (!) 141/95     Lab Results   Component Value Date/Time    Sodium 142 01/10/2017 11:31 AM    Potassium 3.9 01/10/2017 11:31 AM    Chloride 101 01/10/2017 11:31 AM    CO2 25 01/10/2017 11:31 AM    Glucose 89 01/10/2017 11:31 AM    BUN 10 01/10/2017 11:31 AM    Creatinine 0.69 01/10/2017 11:31 AM    BUN/Creatinine ratio 14 01/10/2017 11:31 AM    GFR est AA 122 01/10/2017 11:31 AM    GFR est non-AA 105 01/10/2017 11:31 AM    Calcium 9.6 01/10/2017 11:31 AM     Lab Results   Component Value Date/Time    Hemoglobin A1c 5.2 01/10/2017 11:31 AM     Lab Results   Component Value Date/Time    Cholesterol, total 171 01/10/2017 11:31 AM    HDL Cholesterol 58 01/10/2017 11:31 AM    LDL, calculated 100 (H) 01/10/2017 11:31 AM    VLDL, calculated 13 01/10/2017 11:31 AM    Triglyceride 63 01/10/2017 11:31 AM     Lab Results   Component Value Date/Time    VITAMIN D, 25-HYDROXY 28.1 (L) 10/06/2017 02:16 PM       Lab Results   Component Value Date/Time    TSH 0.911 10/06/2017 02:16 PM

## 2017-11-13 NOTE — Telephone Encounter (Signed)
Requested Prescriptions     Pending Prescriptions Disp Refills   ??? amLODIPine (NORVASC) 5 mg tablet 90 Tab 3     Sig: TAKE 1 TABLET BY MOUTH EVERY DAY  Indications: high blood pressure   ??? indapamide (LOZOL) 1.25 mg tablet 90 Tab 3     Sig: TAKE 1 TABLET BY MOUTH DAILY  Indications: high blood pressure     Patient stated that she has been out of medication for two weeks. These two medications were refilled but she [facility] until she received her mail order in the mail.

## 2017-11-14 ENCOUNTER — Encounter

## 2017-11-14 NOTE — Telephone Encounter (Signed)
-----   Message from Para Skeansracey E Daniels sent at 11/11/2017  3:00 PM EDT -----  Regarding: NP Shanahan, Kelley/refill  Pt is requesting a refill for her "Amlodapine" and  "Indapamide" [facility] [address] and their phone number is [phone]. Best contact number is [phone]

## 2017-11-14 NOTE — Telephone Encounter (Signed)
PCP: Jackey LogeShanahan, Kelley B, NP    Last appt: 08/16/2017  Future Appointments   Date Time Provider Department Center   02/13/2018  8:00 AM Jackey LogeShanahan, Kelley B, NP [facility] SCHED   03/07/2018  8:40 AM Jodi Mourningansom, Mary M, MD [facility] SCHED       Requested Prescriptions      No prescriptions requested or ordered in this encounter       Prior labs and Blood pressures:  BP Readings from Last 3 Encounters:   10/06/17 112/70   08/16/17 133/82   01/10/17 (!) 141/95     Lab Results   Component Value Date/Time    Sodium 142 01/10/2017 11:31 AM    Potassium 3.9 01/10/2017 11:31 AM    Chloride 101 01/10/2017 11:31 AM    CO2 25 01/10/2017 11:31 AM    Glucose 89 01/10/2017 11:31 AM    BUN 10 01/10/2017 11:31 AM    Creatinine 0.69 01/10/2017 11:31 AM    BUN/Creatinine ratio 14 01/10/2017 11:31 AM    GFR est AA 122 01/10/2017 11:31 AM    GFR est non-AA 105 01/10/2017 11:31 AM    Calcium 9.6 01/10/2017 11:31 AM     Lab Results   Component Value Date/Time    Hemoglobin A1c 5.2 01/10/2017 11:31 AM     Lab Results   Component Value Date/Time    Cholesterol, total 171 01/10/2017 11:31 AM    HDL Cholesterol 58 01/10/2017 11:31 AM    LDL, calculated 100 (H) 01/10/2017 11:31 AM    VLDL, calculated 13 01/10/2017 11:31 AM    Triglyceride 63 01/10/2017 11:31 AM     Lab Results   Component Value Date/Time    VITAMIN D, 25-HYDROXY 28.1 (L) 10/06/2017 02:16 PM       Lab Results   Component Value Date/Time    TSH 0.911 10/06/2017 02:16 PM

## 2017-12-19 ENCOUNTER — Institutional Professional Consult (permissible substitution): Payer: BLUE CROSS/BLUE SHIELD | Admitting: Pulmonary Disease

## 2018-01-12 ENCOUNTER — Institutional Professional Consult (permissible substitution): Payer: BLUE CROSS/BLUE SHIELD | Admitting: Pulmonary Disease

## 2018-01-21 ENCOUNTER — Encounter

## 2018-01-23 MED ORDER — AMLODIPINE 5 MG TAB
5 mg | ORAL_TABLET | ORAL | 0 refills | Status: DC
Start: 2018-01-23 — End: 2018-03-22

## 2018-01-23 MED ORDER — INDAPAMIDE 1.25 MG TAB
1.25 mg | ORAL_TABLET | ORAL | 0 refills | Status: DC
Start: 2018-01-23 — End: 2018-03-22

## 2018-01-23 NOTE — Telephone Encounter (Signed)
PCP: Shanahan, Kelley B, NP    Last appt: 08/16/2017  Future Appointments   Date Time Provider Department Center   02/13/2018  8:00 AM Shanahan, Kelley B, NP [facility]   03/07/2018  8:40 AM Ransom, Mary M, MD [facility] SCHED       Requested Prescriptions     Pending Prescriptions Disp Refills   ??? amLODIPine (NORVASC) 5 mg tablet 90 Tab 3     Sig: TAKE 1 TABLET BY MOUTH EVERY DAY  Indications: high blood pressure   ??? indapamide (LOZOL) 1.25 mg tablet 90 Tab 3     Sig: TAKE 1 TABLET BY MOUTH DAILY  Indications: high blood pressure       Prior labs and Blood pressures:  BP Readings from Last 3 Encounters:   10/06/17 112/70   08/16/17 133/82   01/10/17 (!) 141/95     Lab Results   Component Value Date/Time    Sodium 142 01/10/2017 11:31 AM    Potassium 3.9 01/10/2017 11:31 AM    Chloride 101 01/10/2017 11:31 AM    CO2 25 01/10/2017 11:31 AM    Glucose 89 01/10/2017 11:31 AM    BUN 10 01/10/2017 11:31 AM    Creatinine 0.69 01/10/2017 11:31 AM    BUN/Creatinine ratio 14 01/10/2017 11:31 AM    GFR est AA 122 01/10/2017 11:31 AM    GFR est non-AA 105 01/10/2017 11:31 AM    Calcium 9.6 01/10/2017 11:31 AM     Lab Results   Component Value Date/Time    Hemoglobin A1c 5.2 01/10/2017 11:31 AM     Lab Results   Component Value Date/Time    Cholesterol, total 171 01/10/2017 11:31 AM    HDL Cholesterol 58 01/10/2017 11:31 AM    LDL, calculated 100 (H) 01/10/2017 11:31 AM    VLDL, calculated 13 01/10/2017 11:31 AM    Triglyceride 63 01/10/2017 11:31 AM     Lab Results   Component Value Date/Time    VITAMIN D, 25-HYDROXY 28.1 (L) 10/06/2017 02:16 PM       Lab Results   Component Value Date/Time    TSH 0.911 10/06/2017 02:16 PM

## 2018-01-23 NOTE — Telephone Encounter (Signed)
Called patient, a man answered and said she is not available now, said he would ask her to return my call.Need to ask her why she is asking for a refill on lozol as she was given a one year supply on 11/15/17, 90 tabs with 3 refills.

## 2018-02-07 ENCOUNTER — Emergency Department: Admit: 2018-02-07 | Payer: PRIVATE HEALTH INSURANCE | Primary: Family

## 2018-02-07 ENCOUNTER — Inpatient Hospital Stay
Admit: 2018-02-07 | Discharge: 2018-02-07 | Disposition: A | Discharge status: Home / Self Care | Payer: PRIVATE HEALTH INSURANCE | Attending: Emergency Medicine

## 2018-02-07 DIAGNOSIS — G43109 Migraine with aura, not intractable, without status migrainosus: Secondary | ICD-10-CM

## 2018-02-07 LAB — EKG, 12 LEAD, INITIAL
Atrial Rate: 51 {beats}/min
Calculated P Axis: 61 degrees
Calculated R Axis: 18 degrees
Calculated T Axis: 47 degrees
P-R Interval: 224 ms
Q-T Interval: 420 ms
QRS Duration: 84 ms
QTC Calculation (Bezet): 387 ms
Ventricular Rate: 51 {beats}/min

## 2018-02-07 LAB — CBC WITH AUTOMATED DIFF
ABS. BASOPHILS: 0 10*3/uL (ref 0.0–0.1)
ABS. EOSINOPHILS: 0 10*3/uL (ref 0.0–0.4)
ABS. IMM. GRANS.: 0 10*3/uL (ref 0.00–0.04)
ABS. LYMPHOCYTES: 1.2 10*3/uL (ref 0.8–3.5)
ABS. MONOCYTES: 0.3 10*3/uL (ref 0.0–1.0)
ABS. NEUTROPHILS: 1.3 10*3/uL — ABNORMAL LOW (ref 1.8–8.0)
ABSOLUTE NRBC: 0 10*3/uL (ref 0.00–0.01)
BASOPHILS: 1 % (ref 0–1)
EOSINOPHILS: 1 % (ref 0–7)
HCT: 40.4 % (ref 35.0–47.0)
HGB: 13 g/dL (ref 11.5–16.0)
IMMATURE GRANULOCYTES: 0 % (ref 0.0–0.5)
LYMPHOCYTES: 44 % (ref 12–49)
MCH: 32 PG (ref 26.0–34.0)
MCHC: 32.2 g/dL (ref 30.0–36.5)
MCV: 99.5 FL — ABNORMAL HIGH (ref 80.0–99.0)
MONOCYTES: 9 % (ref 5–13)
MPV: 11.4 FL (ref 8.9–12.9)
NEUTROPHILS: 45 % (ref 32–75)
NRBC: 0 PER 100 WBC
PLATELET: 225 10*3/uL (ref 150–400)
RBC: 4.06 M/uL (ref 3.80–5.20)
RDW: 13.6 % (ref 11.5–14.5)
WBC: 2.8 10*3/uL — ABNORMAL LOW (ref 3.6–11.0)

## 2018-02-07 LAB — METABOLIC PANEL, COMPREHENSIVE
A-G Ratio: 1 — ABNORMAL LOW (ref 1.1–2.2)
ALT (SGPT): 16 U/L (ref 12–78)
AST (SGOT): 12 U/L — ABNORMAL LOW (ref 15–37)
Albumin: 4.2 g/dL (ref 3.5–5.0)
Alk. phosphatase: 54 U/L (ref 45–117)
Anion gap: 4 mmol/L — ABNORMAL LOW (ref 5–15)
BUN/Creatinine ratio: 11 — ABNORMAL LOW (ref 12–20)
BUN: 11 MG/DL (ref 6–20)
Bilirubin, total: 0.8 MG/DL (ref 0.2–1.0)
CO2: 29 mmol/L (ref 21–32)
Calcium: 9.8 MG/DL (ref 8.5–10.1)
Chloride: 106 mmol/L (ref 97–108)
Creatinine: 0.98 MG/DL (ref 0.55–1.02)
GFR est AA: >60 mL/min/{1.73_m2} (ref >60)
GFR est non-AA: >60 mL/min/{1.73_m2} (ref >60)
Globulin: 4.4 g/dL — ABNORMAL HIGH (ref 2.0–4.0)
Glucose: 86 mg/dL (ref 65–100)
Potassium: 3.1 mmol/L — ABNORMAL LOW (ref 3.5–5.1)
Protein, total: 8.6 g/dL — ABNORMAL HIGH (ref 6.4–8.2)
Sodium: 139 mmol/L (ref 136–145)

## 2018-02-07 LAB — GLUCOSE, POC
Glucose (POC): 65 mg/dL (ref 65–100)
Glucose (POC): 67 mg/dL (ref 65–100)
Glucose (POC): 88 mg/dL (ref 65–100)

## 2018-02-07 LAB — PROTHROMBIN TIME + INR
INR: 1 (ref 0.9–1.1)
Prothrombin time: 10.3 s (ref 9.0–11.1)

## 2018-02-07 LAB — COMPREHENSIVE METABOLIC PANEL
ALT: 16 U/L (ref 12–78)
AST: 12 U/L — ABNORMAL LOW (ref 15–37)
Albumin/Globulin Ratio: 1 — ABNORMAL LOW (ref 1.1–2.2)
Albumin: 4.2 g/dL (ref 3.5–5.0)
Alkaline Phosphatase: 54 U/L (ref 45–117)
Anion Gap: 4 mmol/L — ABNORMAL LOW (ref 5–15)
BUN: 11 MG/DL (ref 6–20)
Bun/Cre Ratio: 11 — ABNORMAL LOW (ref 12–20)
CO2: 29 mmol/L (ref 21–32)
Calcium: 9.8 MG/DL (ref 8.5–10.1)
Chloride: 106 mmol/L (ref 97–108)
Creatinine: 0.98 MG/DL (ref 0.55–1.02)
EGFR IF NonAfrican American: >60 mL/min/{1.73_m2} (ref >60)
GFR African American: >60 mL/min/{1.73_m2} (ref >60)
Globulin: 4.4 g/dL — ABNORMAL HIGH (ref 2.0–4.0)
Glucose: 86 mg/dL (ref 65–100)
Potassium: 3.1 mmol/L — ABNORMAL LOW (ref 3.5–5.1)
Sodium: 139 mmol/L (ref 136–145)
Total Bilirubin: 0.8 MG/DL (ref 0.2–1.0)
Total Protein: 8.6 g/dL — ABNORMAL HIGH (ref 6.4–8.2)

## 2018-02-07 LAB — POCT GLUCOSE
POC Glucose: 65 mg/dL (ref 65–100)
POC Glucose: 67 mg/dL (ref 65–100)
POC Glucose: 88 mg/dL (ref 65–100)

## 2018-02-07 LAB — CBC WITH AUTO DIFFERENTIAL
Basophils %: 1 % (ref 0–1)
Basophils Absolute: 0 10*3/uL (ref 0.0–0.1)
Eosinophils %: 1 % (ref 0–7)
Eosinophils Absolute: 0 10*3/uL (ref 0.0–0.4)
Granulocyte Absolute Count: 0 10*3/uL (ref 0.00–0.04)
Hematocrit: 40.4 % (ref 35.0–47.0)
Hemoglobin: 13 g/dL (ref 11.5–16.0)
Immature Granulocytes: 0 % (ref 0.0–0.5)
Lymphocytes %: 44 % (ref 12–49)
Lymphocytes Absolute: 1.2 10*3/uL (ref 0.8–3.5)
MCH: 32 PG (ref 26.0–34.0)
MCHC: 32.2 g/dL (ref 30.0–36.5)
MCV: 99.5 FL — ABNORMAL HIGH (ref 80.0–99.0)
MPV: 11.4 FL (ref 8.9–12.9)
Monocytes %: 9 % (ref 5–13)
Monocytes Absolute: 0.3 10*3/uL (ref 0.0–1.0)
NRBC Absolute: 0 10*3/uL (ref 0.00–0.01)
Neutrophils %: 45 % (ref 32–75)
Neutrophils Absolute: 1.3 10*3/uL — ABNORMAL LOW (ref 1.8–8.0)
Nucleated RBCs: 0 PER 100 WBC
Platelets: 225 10*3/uL (ref 150–400)
RBC: 4.06 M/uL (ref 3.80–5.20)
RDW: 13.6 % (ref 11.5–14.5)
WBC: 2.8 10*3/uL — ABNORMAL LOW (ref 3.6–11.0)

## 2018-02-07 LAB — EKG 12-LEAD
Atrial Rate: 51 {beats}/min
P Axis: 61 degrees
P-R Interval: 224 ms
Q-T Interval: 420 ms
QRS Duration: 84 ms
QTc Calculation (Bazett): 387 ms
R Axis: 18 degrees
T Axis: 47 degrees
Ventricular Rate: 51 {beats}/min

## 2018-02-07 LAB — PROTIME-INR
INR: 1 (ref 0.9–1.1)
Protime: 10.3 s (ref 9.0–11.1)

## 2018-02-07 MED ORDER — SODIUM CHLORIDE 0.9% BOLUS IV
0.9 % | Freq: Once | INTRAVENOUS | Status: AC
Start: 2018-02-07 — End: 2018-02-07
  Administered 2018-02-07: 17:00:00 via INTRAVENOUS

## 2018-02-07 MED ORDER — SODIUM CHLORIDE 0.9% BOLUS IV
0.9 % | Freq: Once | INTRAVENOUS | Status: DC
Start: 2018-02-07 — End: 2018-02-07

## 2018-02-07 MED ORDER — DIPHENHYDRAMINE HCL 50 MG/ML IJ SOLN
50 mg/mL | INTRAMUSCULAR | Status: AC
Start: 2018-02-07 — End: 2018-02-07
  Administered 2018-02-07: 17:00:00 via INTRAVENOUS

## 2018-02-07 MED ORDER — DEXAMETHASONE SODIUM PHOSPHATE (PF) 10 MG/ML INJECTION
10 mg/mL | Freq: Once | INTRAMUSCULAR | Status: AC
Start: 2018-02-07 — End: 2018-02-07
  Administered 2018-02-07: 17:00:00 via INTRAVENOUS

## 2018-02-07 MED ORDER — IOPAMIDOL 76 % IV SOLN
370 mg iodine /mL (76 %) | Freq: Once | INTRAVENOUS | Status: AC
Start: 2018-02-07 — End: 2018-02-07
  Administered 2018-02-07: 17:00:00 via INTRAVENOUS

## 2018-02-07 MED ORDER — SODIUM CHLORIDE 0.9 % IV
4 mg/mL | Freq: Once | INTRAVENOUS | Status: DC
Start: 2018-02-07 — End: 2018-02-07

## 2018-02-07 MED ORDER — METOCLOPRAMIDE 5 MG/ML IJ SOLN
5 mg/mL | INTRAMUSCULAR | Status: AC
Start: 2018-02-07 — End: 2018-02-07
  Administered 2018-02-07: 17:00:00 via INTRAVENOUS

## 2018-02-07 MED ORDER — KETOROLAC TROMETHAMINE 30 MG/ML INJECTION
30 mg/mL (1 mL) | INTRAMUSCULAR | Status: DC
Start: 2018-02-07 — End: 2018-02-07

## 2018-02-07 MED ORDER — SODIUM CHLORIDE 0.9 % IJ SYRG
Freq: Once | INTRAMUSCULAR | Status: AC
Start: 2018-02-07 — End: 2018-02-07
  Administered 2018-02-07: 17:00:00 via INTRAVENOUS

## 2018-02-07 MED ORDER — KETOROLAC TROMETHAMINE 30 MG/ML INJECTION
30 mg/mL (1 mL) | INTRAMUSCULAR | Status: AC
Start: 2018-02-07 — End: 2018-02-07
  Administered 2018-02-07: 19:00:00 via INTRAVENOUS

## 2018-02-07 MED FILL — DIPHENHYDRAMINE HCL 50 MG/ML IJ SOLN: 50 mg/mL | INTRAMUSCULAR | Qty: 1

## 2018-02-07 MED FILL — SODIUM CHLORIDE 0.9 % IV: INTRAVENOUS | Qty: 1000

## 2018-02-07 MED FILL — KETOROLAC TROMETHAMINE 30 MG/ML INJECTION: 30 mg/mL (1 mL) | INTRAMUSCULAR | Qty: 1

## 2018-02-07 MED FILL — METOCLOPRAMIDE 5 MG/ML IJ SOLN: 5 mg/mL | INTRAMUSCULAR | Qty: 2

## 2018-02-07 MED FILL — DEXAMETHASONE SODIUM PHOSPHATE (PF) 10 MG/ML INJECTION: 10 mg/mL | INTRAMUSCULAR | Qty: 1

## 2018-02-07 NOTE — ED Notes (Signed)
Dr. Gwynn on screen for eval.  Patient reports feeling headache, and "a little bit dizzy," at this time, but overall feels better.  This RN attempted to obtain blood work prior to TN eval, but patient requesting to void first.

## 2018-02-07 NOTE — ED Notes (Signed)
Patient reports that during the dizziness and blurred vision episode while at work she had just hung up the phone with her daughter and reports she is under a great deal of stress.

## 2018-02-07 NOTE — ED Provider Notes (Signed)
47 y.o. 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 She was normal before this episode. Per EMS her blood pressure was 167/80 and blood glucose was 94. She denies weakness and hx of stroke. She endorses dizziness, and current headache primarily to he temples and into her upper nose that she describes as dull. She reports no hx of similar sx, but states she had migraines "years" ago that began when she was a teenager. She took her prescribed medications this AM. She reports she had a recent Ct for "memory issues" and has an appointment with neurology for December 18th.There are no other acute medical concerns at this time.  Social hx: deneis tobacco use, endorses EtOH    PCP: Jackey LogeShanahan, Kelley B, NP      Note written by Merlene LaughterLindsay J Lickers, Scribe, as dictated by Cam HaiSreshta, Elmer Merwin N, MD 11:10 AM      The history is provided by the patient. No language interpreter was used.        Past Medical History:   Diagnosis Date   ??? Anemia     Last CBC: h/h 10.1/29.8 (10/2008)   ??? Genital herpes    ??? History of loop electrical excision procedure (LEEP)    ??? 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 Repeat Pap in 1 year   ??? Hypertension    ??? Migraine     None in years, 3y, since have a piercing in upper ear after someone suggested this could help   ??? Vitamin D deficiency 10/14/2013       Past Surgical History:   Procedure Laterality Date   ??? HX CESAREAN SECTION  1995, 2012    x2   ??? HX DILATION AND CURETTAGE  04/14/14   ??? HX LEEP PROCEDURE  02/2013    with colposcopy   ??? HX OTHER SURGICAL Right 2007, 02/2013    Eye surgery, ptyergium removal   ??? HX TUBAL LIGATION  01/2011         Family History:   Problem Relation Age of Onset    ??? Hypertension Mother    ??? Cancer Maternal Grandmother         cervical?   ??? Cancer Maternal Aunt         cervical?   ??? Other Father         Just had legs amputated due to blood clots and gangrene, no DM   ??? Hypertension Sister    ??? No Known Problems Brother    ??? No Known Problems Maternal Grandfather    ??? Diabetes Paternal Grandmother    ??? No Known Problems Paternal Grandfather    ??? No Known Problems Brother    ??? Hypertension Sister    ??? No Known Problems Sister    ??? No Known Problems Son    ??? No Known Problems Son    ??? No Known Problems Daughter    ??? Breast Cancer Neg Hx        Social History     Socioeconomic History   ??? Marital status: SINGLE     Spouse name: Not on file   ??? Number of children: 3   ??? Years of education: Not on file   ???  Highest education level: Not on file   Occupational History   ??? Occupation: Runner, broadcasting/film/video     Comment: [employer] school   ??? Occupation: independent Psychologist, sport and exercise     Comment: Runner, broadcasting/film/video, in partnership with [employer]    Social Needs   ??? Financial resource strain: Not on file   ??? Food insecurity:     Worry: Not on file     Inability: Not on file   ??? Transportation needs:     Medical: Not on file     Non-medical: Not on file   Tobacco Use   ??? Smoking status: Never Smoker   ??? Smokeless tobacco: Never Used   Substance and Sexual Activity   ??? Alcohol use: Yes     Alcohol/week: 0.0 standard drinks     Comment: glasses of wine occasional, less than once a month   ??? Drug use: No   ??? Sexual activity: Yes     Partners: Male     Birth control/protection: None, Surgical     Comment: monogamous with partner since about 2014   Lifestyle   ??? Physical activity:     Days per week: Not on file     Minutes per session: Not on file   ??? Stress: Not on file   Relationships   ??? Social connections:     Talks on phone: Not on file     Gets together: Not on file     Attends religious service: Not on file     Active member of club or organization: Not on file      Attends meetings of clubs or organizations: Not on file     Relationship status: Not on file   ??? Intimate partner violence:     Fear of current or ex partner: Not on file     Emotionally abused: Not on file     Physically abused: Not on file     Forced sexual activity: Not on file   Other Topics Concern   ??? Not on file   Social History Narrative    Lives in [place] with two sons (one in school at [employer]), one dtr (after [place]), mother  Originally from [place]         Earned EdD at [employer] in 2018 in education.          ALLERGIES: Influenza virus vaccine, specific; Lexapro [escitalopram]; and Lisinopril    Review of Systems   Constitutional: Negative for chills and fever.   Eyes: Positive for visual disturbance. Negative for photophobia.   Respiratory: Negative for shortness of breath.    Cardiovascular: Negative for chest pain.   Gastrointestinal: Negative for abdominal pain.   Genitourinary: Negative for dysuria.   Musculoskeletal: Negative for back pain.   Neurological: Positive for dizziness, light-headedness and headaches. Negative for weakness.   Psychiatric/Behavioral: Negative for confusion.   All other systems reviewed and are negative.      There were no vitals filed for this visit.         Physical Exam  Constitutional:       General: She is not in acute distress.  HENT:      Head: Normocephalic.   Eyes:      Extraocular Movements: Extraocular movements intact.      Pupils: Pupils are equal, round, and reactive to light.   Cardiovascular:      Rate and Rhythm: Normal rate and regular rhythm.      Heart  sounds: Normal heart sounds.   Pulmonary:      Effort: Pulmonary effort is normal.      Breath sounds: Normal breath sounds.   Abdominal:      General: There is no distension.   Musculoskeletal:      Comments: 5/5 strength all extremities   Skin:     General: Skin is warm.   Neurological:      Mental Status: She is alert and oriented to person, place, and time.       Cranial Nerves: Cranial nerves are intact. No cranial nerve deficit or dysarthria.      Sensory: Sensation is intact. No sensory deficit.      Motor: Motor function is intact. No weakness or pronator drift.      Coordination: Finger-Nose-Finger Test normal.      Comments: Fluent speech   Psychiatric:         Behavior: Behavior normal.        Note written by Merlene Laughter, Scribe, as dictated by Cam Hai, MD 11:39 AM    MDM  Number of Diagnoses or Management Options  Near syncope:   Nonintractable headache, unspecified chronicity pattern, unspecified headache type:   Diagnosis management comments: 47 year old woman with acute onset of headache with transient binocular blurry vision, no focal weakness, fluent speech, no diplopia, exam reassuring.  Tele-neurologist on exam the patient.  CT negative.  CTA also did not reveal any obstructive disease.  Doubt intracranial hemorrhage, aneurysm.  will follow-up as an outpatient.  On reevaluation at the time of discharge patient symptoms have resolved.         Procedures    CONSULT NOTE:  11:18 AM Cam Hai, MD spoke with Dr. Ivette Loyal, Consult for teleneurology.  Discussed available diagnostic tests and clinical findings.  Dr. Ivette Loyal will evaluate the patient via monitor.    CONSULT NOTE:  11:33 AM Cam Hai, MD spoke with Dr. Ivette Loyal, Consult for teleneurology.  Discussed available diagnostic tests and clinical findings.  Dr. Ivette Loyal agrees no further stroke work up is needed, and believes it was probably orthostatic hypotension or migraine causing pt sx.     ED EKG interpretation: 1209  Rhythm: sinus bradycardia; and regular . Rate (approx.): 50; Axis: normal; ST/T wave: normal.    Note written by Merlene Laughter, Scribe, as dictated by Cam Hai, MD 1:29 PM

## 2018-02-07 NOTE — ED Triage Notes (Signed)
Triage Note: Patient is coming in via EMS from work with sudden onset of headache, nausea, dizziness and blurry vision at 1000 this morning. BS 94 BP 159/80

## 2018-02-07 NOTE — ED Notes (Signed)
Patient ambulatory to restroom with steady gait.

## 2018-02-07 NOTE — ED Notes (Signed)
Bedside shift change report given to Ashley (oncoming nurse) by Anna (offgoing nurse). Report included the following information SBAR.

## 2018-02-07 NOTE — ED Notes (Signed)
Patient to CTA on monitor x3.  Orders clarified with Dr. Sreshta or CTA only at this time.

## 2018-02-07 NOTE — ED Notes (Signed)
Pt given discharge instructions, patient education and follow-up information. Pt states understanding - all questions answered. Pt discharged to home in private vehicle, ambulatory. Pt A&Ox4, RA, with pain controlled.

## 2018-02-07 NOTE — ED Notes (Signed)
Blood sugar 67, orange juice given

## 2018-02-07 NOTE — ED Provider Notes (Signed)
47 y.o. 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 She was normal before this episode. Per EMS her blood pressure was 167/80 and blood glucose was 94. She denies weakness and hx of stroke. She endorses dizziness, and current headache primarily to he temples and into her upper nose that she describes as dull. She reports no hx of similar sx, but states she had migraines "years" ago that began when she was a teenager. She took her prescribed medications this AM. She reports she had a recent Ct for "memory issues" and has an appointment with neurology for December 18th.There are no other acute medical concerns at this time.  Social hx: deneis tobacco use, endorses EtOH    PCP: Jackey Loge, NP      Note written by Merlene Laughter, Scribe, as dictated by Cam Hai, MD 11:10 AM      The history is provided by the patient. No language interpreter was used.        Past Medical History:   Diagnosis Date   ??? Anemia     Last CBC: h/h 10.1/29.8 (10/2008)   ??? Genital herpes    ??? History of loop electrical excision procedure (LEEP)    ??? 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 Repeat Pap in 1 year   ??? Hypertension    ??? Migraine     None in years, 3y, since have a piercing in upper ear after someone suggested this could help   ??? Vitamin D deficiency 10/14/2013       Past Surgical History:   Procedure Laterality Date   ??? HX CESAREAN SECTION  1995, 2012    x2   ??? HX DILATION AND CURETTAGE  04/14/14   ??? HX LEEP PROCEDURE  02/2013    with colposcopy   ??? HX OTHER SURGICAL Right 2007, 02/2013    Eye surgery, ptyergium removal   ??? HX TUBAL LIGATION  01/2011         Family History:   Problem Relation Age of Onset   ???  Hypertension Mother    ??? Cancer Maternal Grandmother         cervical?   ??? Cancer Maternal Aunt         cervical?   ??? Other Father         Just had legs amputated due to blood clots and gangrene, no DM   ??? Hypertension Sister    ??? No Known Problems Brother    ??? No Known Problems Maternal Grandfather    ??? Diabetes Paternal Grandmother    ??? No Known Problems Paternal Grandfather    ??? No Known Problems Brother    ??? Hypertension Sister    ??? No Known Problems Sister    ??? No Known Problems Son    ??? No Known Problems Son    ??? No Known Problems Daughter    ??? Breast Cancer Neg Hx        Social History     Socioeconomic History   ??? Marital status: SINGLE     Spouse name: Not on file   ??? Number of children: 3   ??? Years of education: Not on file   ???  Highest education level: Not on file   Occupational History   ??? Occupation: Runner, broadcasting/film/video     Comment: [employer] school   ??? Occupation: independent Psychologist, sport and exercise     Comment: Runner, broadcasting/film/video, in partnership with [employer]    Social Needs   ??? Financial resource strain: Not on file   ??? Food insecurity:     Worry: Not on file     Inability: Not on file   ??? Transportation needs:     Medical: Not on file     Non-medical: Not on file   Tobacco Use   ??? Smoking status: Never Smoker   ??? Smokeless tobacco: Never Used   Substance and Sexual Activity   ??? Alcohol use: Yes     Alcohol/week: 0.0 standard drinks     Comment: glasses of wine occasional, less than once a month   ??? Drug use: No   ??? Sexual activity: Yes     Partners: Male     Birth control/protection: None, Surgical     Comment: monogamous with partner since about 2014   Lifestyle   ??? Physical activity:     Days per week: Not on file     Minutes per session: Not on file   ??? Stress: Not on file   Relationships   ??? Social connections:     Talks on phone: Not on file     Gets together: Not on file     Attends religious service: Not on file     Active member of club or organization: Not on file     Attends meetings of clubs or  organizations: Not on file     Relationship status: Not on file   ??? Intimate partner violence:     Fear of current or ex partner: Not on file     Emotionally abused: Not on file     Physically abused: Not on file     Forced sexual activity: Not on file   Other Topics Concern   ??? Not on file   Social History Narrative    Lives in [place] with two sons (one in school at [employer]), one dtr (after [place]), mother  Originally from [place]         Earned EdD at [employer] in 2018 in education.          ALLERGIES: Influenza virus vaccine, specific; Lexapro [escitalopram]; and Lisinopril    Review of Systems   Constitutional: Negative for chills and fever.   Eyes: Positive for visual disturbance. Negative for photophobia.   Respiratory: Negative for shortness of breath.    Cardiovascular: Negative for chest pain.   Gastrointestinal: Negative for abdominal pain.   Genitourinary: Negative for dysuria.   Musculoskeletal: Negative for back pain.   Neurological: Positive for dizziness, light-headedness and headaches. Negative for weakness.   Psychiatric/Behavioral: Negative for confusion.   All other systems reviewed and are negative.      There were no vitals filed for this visit.         Physical Exam  Constitutional:       General: She is not in acute distress.  HENT:      Head: Normocephalic.   Eyes:      Extraocular Movements: Extraocular movements intact.      Pupils: Pupils are equal, round, and reactive to light.   Cardiovascular:      Rate and Rhythm: Normal rate and regular rhythm.      Heart  sounds: Normal heart sounds.   Pulmonary:      Effort: Pulmonary effort is normal.      Breath sounds: Normal breath sounds.   Abdominal:      General: There is no distension.   Musculoskeletal:      Comments: 5/5 strength all extremities   Skin:     General: Skin is warm.   Neurological:      Mental Status: She is alert and oriented to person, place, and time.      Cranial Nerves: Cranial nerves are intact. No cranial nerve deficit or  dysarthria.      Sensory: Sensation is intact. No sensory deficit.      Motor: Motor function is intact. No weakness or pronator drift.      Coordination: Finger-Nose-Finger Test normal.      Comments: Fluent speech   Psychiatric:         Behavior: Behavior normal.        Note written by Merlene LaughterLindsay J Lickers, Scribe, as dictated by Cam HaiSreshta, Avrie Kedzierski N, MD 11:39 AM    MDM  Number of Diagnoses or Management Options  Near syncope:   Nonintractable headache, unspecified chronicity pattern, unspecified headache type:   Diagnosis management comments: 47 year old woman with acute onset of headache with transient binocular blurry vision, no focal weakness, fluent speech, no diplopia, exam reassuring.  Tele-neurologist on exam the patient.  CT negative.  CTA also did not reveal any obstructive disease.  Doubt intracranial hemorrhage, aneurysm.  will follow-up as an outpatient.  On reevaluation at the time of discharge patient symptoms have resolved.         Procedures    CONSULT NOTE:  11:18 AM Cam HaiSreshta, Safiyyah Vasconez N, MD spoke with Dr. Ivette LoyalGwynn, Consult for teleneurology.  Discussed available diagnostic tests and clinical findings.  Dr. Ivette LoyalGwynn will evaluate the patient via monitor.    CONSULT NOTE:  11:33 AM Cam HaiSreshta, Kallen Delatorre N, MD spoke with Dr. Ivette LoyalGwynn, Consult for teleneurology.  Discussed available diagnostic tests and clinical findings.  Dr. Ivette LoyalGwynn agrees no further stroke work up is needed, and believes it was probably orthostatic hypotension or migraine causing pt sx.     ED EKG interpretation: 1209  Rhythm: sinus bradycardia; and regular . Rate (approx.): 50; Axis: normal; ST/T wave: normal.    Note written by Merlene LaughterLindsay J Lickers, Scribe, as dictated by Cam HaiSreshta, Darin Arndt N, MD 1:29 PM

## 2018-02-07 NOTE — ED Notes (Signed)
Blood sugar 67, orange juice given

## 2018-02-07 NOTE — ED Notes (Signed)
Dr. Ivette Loyal on screen for eval.  Patient reports feeling headache, and "a little bit dizzy," at this time, but overall feels better.  This RN attempted to obtain blood work prior to TN eval, but patient requesting to void first.

## 2018-02-07 NOTE — ED Notes (Signed)
Patient ambulatory to restroom with steady gait.

## 2018-02-07 NOTE — ED Notes (Signed)
Triage Note: Patient is coming in via EMS from work with sudden onset of headache, nausea, dizziness and blurry vision at 1000 this morning. BS 94 BP 159/80

## 2018-02-07 NOTE — ED Notes (Signed)
Patient reports that during the dizziness and blurred vision episode while at work she had just hung up the phone with her daughter and reports she is under a great deal of stress.

## 2018-02-07 NOTE — ED Notes (Signed)
Bedside shift change report given to Morrie SheldonAshley (Cabin crewoncoming nurse) by Tobi BastosAnna (offgoing nurse). Report included the following information SBAR.

## 2018-02-07 NOTE — ED Notes (Signed)
Patient to CTA on monitor x3.  Orders clarified with Dr. Lilla Shook or CTA only at this time.

## 2018-02-08 ENCOUNTER — Ambulatory Visit: Payer: Self-pay | Admitting: *Deleted

## 2018-02-08 NOTE — Telephone Encounter (Signed)
Pt called stating that she gave her 46 year old son, Will, her amlodipine instead of his ADD medication; she states that she chewed it up and swallowed it; this occurred 02/08/18 at 0630;  The pt also reports that her son is not having symptoms; she realized it 30 min later; conference call initiated with Poison Control, spoke with Okey Dupreose [phone]); Rose advised the  pt was advised to given her son the medication for ADD, instructed him to drink lots of fluids; and reviewed signs and symptoms that need to be reported; Poison Control will have a follow up conversation with the pt in 8 hours; nurse triage also instructed the pt to notify her son's pediatrician; she verbalized understanding of instructions; the pt is normally seen by Alphonse GuildAshleigh Shambley, [facility]; will route to office for notification of this encounter.  Reason for Disposition . Took another person's prescription drug  Answer Assessment - Initial Assessment Questions 1. SYMPTOMS: "Do you have any symptoms?"     none 2. SEVERITY: If symptoms are present, ask "Are they mild, moderate or severe?"     none  Protocols used: MEDICATION QUESTION CALL-A-AH

## 2018-02-13 ENCOUNTER — Ambulatory Visit: Admit: 2018-02-13 | Discharge: 2018-02-13 | Payer: PRIVATE HEALTH INSURANCE | Attending: Family | Primary: Family

## 2018-02-13 ENCOUNTER — Ambulatory Visit: Attending: Family | Primary: Family

## 2018-02-13 DIAGNOSIS — I10 Essential (primary) hypertension: Secondary | ICD-10-CM

## 2018-02-13 MED ORDER — SOLIFENACIN 5 MG TAB
5 mg | ORAL_TABLET | Freq: Every day | ORAL | 1 refills | Status: DC
Start: 2018-02-13 — End: 2018-03-22

## 2018-02-13 NOTE — Progress Notes (Signed)
Identified pt with two pt identifiers(name and DOB). Reviewed record in preparation for visit and have obtained necessary documentation.  Chief Complaint   Patient presents with   ??? Hospital Follow Up   ??? Blood Pressure Check      Visit Vitals  Wt 166 lb (75.3 kg)   BMI 26.00 kg/m??       Pain Scale: /10  Pain Location:     Health Maintenance Due   Topic   ??? PAP AKA CERVICAL CYTOLOGY    ??? Influenza Age 47 to Adult        Coordination of Care Questionnaire:  :   1) Have you been to an emergency room, urgent care, or hospitalized since your last visit?  If yes, where when, and reason for visit? Yes, [facility], 02/07/2018, Dizziness      2. Have seen or consulted any other health care provider since your last visit?   If yes, where when, and reason for visit?  NO      3) Do you have an Advanced Directive/ Living Will in place? NO  If yes, do we have a copy on file NO  If no, would you like information NO    Patient is accompanied by self I have received verbal consent from Michelle Benitez to discuss any/all medical information while they are present in the room.

## 2018-02-13 NOTE — Progress Notes (Signed)
S: Michelle Benitez is a 47 y.o. female who presents for depression/anxiety follow up    Assessment/Plan:    1.   Essential hypertension with goal blood pressure less than 140/90  -current therapy: amlodpine 5mg  + indapamide 1.25mg   -BP today: 132/96 sitting; 120/88 standing; at home - 120's   -has had incidents of lightheaded/dizziness at school and home; advised good hydration as pt not drinking any fluids during day; also discussed s/s of hypoglycemia  -advised to monitor BP at home and keep log; goal<140/90 make OV if consistently above goal or <110/60    2. Depression/Anxiety  -no current therapy  -PHQ = 8, GAD = 9 (P = 2, GAD = 1@OV  08/16/17)  -pt declines daily med; will consider buspar prn and call in to start, if she decides to try it  -supportive instructions given  -referral to counseling    3. 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  No homicidal ideation.  PHQ-9 Score: 8  Over the past 2 weeks, how often have you been bothered by any of the following problems? (Not at all = 0; several days = 1; More than 1/2 the days = 2; nearly every day = 3)   1) Little interest or pleasure in doing things:  2  2) Feeling down, depressed or hopeless:1  3) Trouble falling asleep, staying asleep or sleeping too much:3  4) Feeling tired or having little energy:1  5) Poor appetite or overeating:0  6) Feeling bad about yourself - or that you're a failure or have let yourself or your family down:1  7) Trouble concentrating on things, such as reading the newspaper or watching TV: 0  8) Moving or speaking so slowly that other people could have noticed. Or, the opposite - being so fidgety or restless that you have been moving around a lot more than usual:0  9) Thoughts that you would be better off dead or of hurting yourself in some way:0    GAD7 score: 9  Feeling nervous, anxious or on edge:   1  Not being able to stop or control worrying:1  Worrying too much about different things:1  Trouble relaxing:  2  Being so restless that it is hard to sit still:  1  Becoming easily annoyed or irritable:  2  Feeling afraid as if something awful might happen:   If any of the above were scored more  than 0, how difficult have these problems made it for you to do your work, take care of things at home, or get along with other people?    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 Alcohol/week: 0.0 standard drinks    Comment: glasses of wine occasional, less than once a month     Social History     Substance and Sexual Activity   Drug Use No        Review of Systems:  - Constitutional Symptoms: no fevers, chills, weight loss  - Cardiovascular:  no palpitations, chest pain  - Respiratory: no cough or shortness of breath     No LMP recorded. Patient has had an ablation.    I reviewed the following:  Past Medical History:   Diagnosis Date   ??? Anemia     Last CBC: h/h 10.1/29.8 (10/2008)   ??? Genital herpes    ??? History of loop electrical excision procedure (LEEP)    ??? 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 Repeat Pap in 1 year   ??? Hypertension    ??? Migraine     None in years, 3y, since have a piercing in upper ear after someone suggested this could help   ??? Vitamin D deficiency 10/14/2013       Current Outpatient Medications   Medication Sig Dispense Refill   ??? amLODIPine (NORVASC) 5 mg tablet TAKE 1 TABLET BY MOUTH EVERY DAY  Indications: high blood pressure 90 Tab 0   ??? indapamide (LOZOL) 1.25 mg tablet TAKE 1 TABLET BY MOUTH DAILY  Indications: high blood pressure 90 Tab 0   ??? valACYclovir (VALTREX) 1 gram tablet TAKE 1 TABLET BY MOUTH DAILY 90 Tab 2   ??? multivitamin (ONE A DAY) tablet Take 1 Tab by mouth daily.         Allergies   Allergen Reactions   ??? Influenza Virus Vaccine, Specific Swelling     Happened 2 years in a row.   ??? Lexapro [Escitalopram] Other (comments)     insomnia   ??? Lisinopril Angioedema     Lip swelling on 07/10/13 while taking lisinopril-HCTZ       O: VS:   Visit Vitals  BP 120/88 (BP 1 Location: Left arm, BP Patient Position: Standing)   Pulse 71   Temp 97.7 ??F (36.5 ??C) (Oral)   Resp 16   Ht 5\' 7"  (1.702 m)   Wt 166 lb (75.3 kg)   SpO2 100%   BMI 26.00 kg/m??       GENERAL: Marylee Belzer is in no acute distress. Non-toxic. Well nourished. Well developed. Appropriately groomed.  RESP: Breath sounds are symmetrical bilaterally. Unlabored without SOB. Speaking in full sentences. Clear to auscultation bilaterally anteriorly and posteriorly.  No wheezes.  No rales or rhonchi.    CV: normal rate.  Regular rhythm. S1, S2 audible.  No murmur noted.  No rubs, clicks or gallops noted.   NEURO:  awake,  alert and oriented to person, place, and time and event.   Clear speech.  Muscle strength is +5/5 x 4 extremities.  Sensation is intact to light touch bilaterally.  Steady gait.  PSYCH: appropriate behavior, dress and thought processes.  Good eye contact.  Clear and coherent speech. Full affect. Good insight.   ____________________________________________________________________  I spent >25 minutes face to face with patient with >50% of time spent in counseling and coordinating care.  Patient education was done.  Advised on nutrition, physical activity, weight management, tobacco, alcohol and safety, including suicide hotline.  Counseling included discussion of diagnosis, differentials, treatment options, prescribed treatment, warning signs and follow up. Medication risks/benefits, costs interactions and alternatives discussed with patient.    ??  Patient agreed to plan of care and verbalized understanding.  Patient was given an after visit summary which included current diagnoses, medications and vital signs.    Pt agrees to follow up in 4 weeks.

## 2018-02-13 NOTE — Patient Instructions (Addendum)
1) Your blood pressure looks good today, but your blood pressure is different from sitting to standing.  This can be caused by dehydration. Please make sure you are drinking AT LEAST 64 oz of water daily.    Please check your blood pressure through the week at staggered times in the day. (If you check in the morning, it should be at least one hour after your morning blood pressure medications.)      Arm monitors are most accurate.  If you use a wrist monitor, make sure your wrist is at heart level. You can bring your home monitor to your next visit and have it calibrated with the machine in the office to gauge your readings. Sit  with your feet uncrossed and relax for 5 minutes before taking your BP.    Keep a written record of your blood pressure readings and bring it to each appointment.  If your systolic (top) blood pressure is consistently greater than or less than of the diastolic (bottom) number is consistently greater than or less than then please schedule an office appointment.      Work on healthy eating - no salt diet, more potassium (helps flush out sodium,making healthier heart and arteries) - and incorporating daily exercise into your routine.    Cardiac symptoms that would need immediate attention include: uncomfortable pressure, squeezing, fullness or pain in the center of your chest. Pain or discomfort in one or both arms, the back, neck, jaw or stomach. Shortness of breath with or without chest discomfort, breaking out in a cold sweat, nausea or lightheadedness.    2) Buspar is a medication used for anxiety that can be used as needed.  Please let me know if you want to try this medication.   Anxiety is a common problem. It affects all kinds of people. There is no reason to feel embarrassed about getting treatment for anxiety. Whether you have occasional anxiety or a diagnosable disorder, there are steps you can take to help minimize and manage feeling of anxiety.       Everyone feels anxious or nervous once in a while. That is normal. But being extremely anxious or worried on most days for 6 months or longer is not normal, and is considered a medical problem. This is called "generalized anxiety disorder." The disorder can make it hard to do everyday tasks.  Generalized anxiety disorder is just one anxiety disorder. There are others, such as panic disorder and phobias.     Symptoms of extreme or severe anxiety:  People with extreme or severe anxiety feel very worried or "on edge" much of the time. They can have trouble sleeping or forget things. Plus, they can have physical symptoms. For instance, people with severe anxiety often feel very tired and have tense muscles. Some get stomach aches or feel chest "tightness."    Steps you can do on your own to feel better:  Deep breathing exercises: Deep breathing triggers a relaxation response, helping to change from the "fight-or-flight" response anxiety brings on.  Inhale slowly to a count of 4, starting at your belly and then moving into your chest.  Gently hold your breath for 4 counts, then slowly exhale to 4 counts.   "Clench" exercise - clench various zones in your body for 30 seconds, then an overall body clench  Exercise can help many people feel less anxious. Regular cardiovascular exercise (such as fast walking,) release endorphins - the "feel good" hormone in our  body - and can reduce anxiety.  Proper sleep:  Sleep is important to overall health. Not getting enough sleep can cause fatigue, inattention, and irritabililty, causing anxiety levels to increase.  Healthy Diet: a healthy diet rich in whole grains, vegetables and fruits is healthier than simple carbohydrates found in processed foods. Skipping meals can cause blood sugar to drop and cause jittery feelings that could worsening underlying anxiety. It also a good idea to cut down on or stop drinking coffee and other sources of caffeine. Caffeine can make anxiety worse.   Find "Me" time - it is important to take some time just to focus on you and help alleviate stress in daily life.  This could be daily exercise, walking the dog, sitting in a quiet place without distraction, meditation, etc.     Medical treatments include:  ?Psychotherapy ??? Psychotherapy involves meeting with a mental health counselor to talk about your feelings, relationships, and worries. Therapy can help you find new ways of thinking about your situation so that you feel less anxious. In therapy, you might also learn new skills to reduce anxiety.  Please call your insurance company to get a list of therapists that will be covered and make an appointment.  Alternatively, you can go to Psychology Today's website to find a counselor.    ?? Go to [email or web address]  ?? Enter your zip code.  ?? Click on your insurance carrier (usually on left side of screen).  ?? Then click any other parameters you desire.      This will result in a list of providers. Click on any provider to learn more about them or see the contact information. Please choose a provider, call them, and schedule an appointment.   ?Medicines ??? Medicines used to treat depression can relieve anxiety, too, even in people who are not depressed. Some people have psychotherapy and take medicines at the same time.    Phone apps that can help with anxiety:  CALM - Use the principles of mindfulness and meditation to ease your mind and keep anxiety at bay. The serene interface is just the beginning. Once it opens, there are relaxing sounds and sleep stories. Enjoy guided meditations at various lengths to help with everything from building self-esteem to calming anxiety.    SIMPLE HABIT - guided meditation for anxiety relief    SAM - Self-help for Anxiety Management - range of self-help methods. Helps you understand what causes your anxiety, monitor your anxious thoughts and behavior over time and manage your anxiety through self-help exercises and  private reflection.    BOOSTERBUDDY   This app offers a novel way for teens and young adults to improve their resilience and work toward being healthier both physically and emotionally.      7 CUPS  7 Cups uses trained, volunteer, active listeners to provide free, anonymous, and confidential emotional support to people needing help coping with acute stressors and long-term mental health issues.    MOODTOOLS - DEPRESSION AID AND FEARTOOLS - ANXIETY AID   The MoodTools and FearTools apps provide people with quick resources for tracking their cognitive distortions, activities, and safety plan in case of an emergency.      SLEEPBOT   SleepBot is a quick and simple way for people to log their sleep and improve their sleep hygiene.     WHAT???S UP? ??? MENTAL HEALTH APP   The What???s Up? app helps people monitor their mood and uses principles of CBT and acceptance commitment therapy (  ACT) to help people reframe their thoughts and cognitive distortions.     Keep the numbers for these national suicide hotlines: 1-800-273-TALK 915-800-1976) and 1-800-SUICIDE 4067072221). If you or someone you know talks about suicide or feeling hopeless, get help right away.    [facility] Mental Health Support Services  Clinic: [phone]  Crisis: [phone]    [facility] Mental Health Services  Clinic: [phone]  Crisis: [phone]    [facility] Mental Health Services  Clinic: [phone]  Crisis: [phone]             Learning About the Mediterranean Diet  What is the Mediterranean diet?    The Mediterranean diet is a style of eating rather than a diet plan. It features foods eaten in [place], [place], southern [place] and [place], and other countries along the [employer]. It emphasizes eating foods like fish, fruits, vegetables, beans, high-fiber breads and whole grains, nuts, and olive oil. This style of eating includes limited red meat, cheese, and sweets.  Why choose the Mediterranean diet?   A Mediterranean-style diet may improve heart health. It contains more fat than other heart-healthy diets. But the fats are mainly from nuts, unsaturated oils (such as fish oils and olive oil), and certain nut or seed oils (such as canola, soybean, or flaxseed oil). These fats may help protect the heart and blood vessels.  How can you get started on the Mediterranean diet?  Here are some things you can do to switch to a more Mediterranean way of eating.  What to eat  ?? Eat a variety of fruits and vegetables each day, such as grapes, blueberries, tomatoes, broccoli, peppers, figs, olives, spinach, eggplant, beans, lentils, and chickpeas.  ?? Eat a variety of whole-grain foods each day, such as oats, brown rice, and whole wheat bread, pasta, and couscous.  ?? Eat fish at least 2 times a week. Try tuna, salmon, mackerel, lake trout, herring, or sardines.  ?? Eat moderate amounts of low-fat dairy products, such as milk, cheese, or yogurt.  ?? Eat moderate amounts of poultry and eggs.  ?? Choose healthy (unsaturated) fats, such as nuts, olive oil, and certain nut or seed oils like canola, soybean, and flaxseed.  ?? Limit unhealthy (saturated) fats, such as butter, palm oil, and coconut oil. And limit fats found in animal products, such as meat and dairy products made with whole milk. Try to eat red meat only a few times a month in very small amounts.  ?? Limit sweets and desserts to only a few times a week. This includes sugar-sweetened drinks like soda.  The Mediterranean diet may also include red wine with your meal???1 glass each day for women and up to 2 glasses a day for men.  Tips for eating at home  ?? Use herbs, spices, garlic, lemon zest, and citrus juice instead of salt to add flavor to foods.  ?? Add avocado slices to your sandwich instead of bacon.  ?? Have fish for lunch or dinner instead of red meat. Brush the fish with olive oil, and broil or grill it.  ?? Sprinkle your salad with seeds or nuts instead of cheese.   ?? Cook with olive or canola oil instead of butter or oils that are high in saturated fat.  ?? Switch from 2% milk or whole milk to 1% or fat-free milk.  ?? Dip raw vegetables in a vinaigrette dressing or hummus instead of dips made from mayonnaise or sour cream.  ?? Have a piece of  fruit for dessert instead of a piece of cake. Try baked apples, or have some dried fruit.  Tips for eating out  ?? Try broiled, grilled, baked, or poached fish instead of having it fried or breaded.  ?? Ask your server to have your meals prepared with olive oil instead of butter.  ?? Order dishes made with marinara sauce or sauces made from olive oil. Avoid sauces made from cream or mayonnaise.  ?? Choose whole-grain breads, whole wheat pasta and pizza crust, brown rice, beans, and lentils.  ?? Cut back on butter or margarine on bread. Instead, you can dip your bread in a small amount of olive oil.  ?? Ask for a side salad or grilled vegetables instead of french fries or chips.  Where can you learn more?  Go to [employer].  Enter O407 in the search box to learn more about "Learning About the Mediterranean Diet."  Current as of: January 25, 2017  Content Version: 12.2  ?? 2006-2019 [employer]. Care instructions adapted under license by [employer] (which disclaims liability or warranty for this information). If you have questions about a medical condition or this instruction, always ask your healthcare professional. Healthwise, Inc[employer] disclaims any warranty or liability for your use of this information.

## 2018-02-13 NOTE — Progress Notes (Signed)
 Identified pt with two pt identifiers(name and DOB). Reviewed record in preparation for visit and have obtained necessary documentation.  Chief Complaint   Patient presents with   . Hospital Follow Up   . Blood Pressure Check      Visit Vitals  Wt 166 lb (75.3 kg)   BMI 26.00 kg/m       Pain Scale: /10  Pain Location:     Health Maintenance Due   Topic   . PAP AKA CERVICAL CYTOLOGY    . Influenza Age 47 to Adult        Coordination of Care Questionnaire:  :   1) Have you been to an emergency room, urgent care, or hospitalized since your last visit?  If yes, where when, and reason for visit? Yes, [facility], 02/07/2018, Dizziness      2. Have seen or consulted any other health care provider since your last visit?   If yes, where when, and reason for visit?  NO      3) Do you have an Advanced Directive/ Living Will in place? NO  If yes, do we have a copy on file NO  If no, would you like information NO    Patient is accompanied by self I have received verbal consent from Michelle Benitez to discuss any/all medical information while they are present in the room.

## 2018-02-13 NOTE — Progress Notes (Signed)
S: Michelle Benitez is a 47 y.o. female who presents for depression/anxiety follow up    Assessment/Plan:    1.   Essential hypertension with goal blood pressure less than 140/90  -current therapy: amlodpine 5mg  + indapamide 1.25mg   -BP today: 132/96 sitting; 120/88 standing; at home - 120's   -has had incidents of lightheaded/dizziness at school and home; advised good hydration as pt not drinking any fluids during day; also discussed s/s of hypoglycemia  -advised to monitor BP at home and keep log; goal<140/90 make OV if consistently above goal or <110/60    2. Depression/Anxiety  -no current therapy  -PHQ = 8, GAD = 9 (P = 2, GAD = 1@OV  08/16/17)  -pt declines daily med; will consider buspar prn and call in to start, if she decides to try it  -supportive instructions given  -referral to counseling    3. 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  No homicidal ideation.  PHQ-9 Score: 8  Over the past 2 weeks, how often have you been bothered by any of the following problems? (Not at all = 0; several days = 1; More than 1/2 the days = 2; nearly every day = 3)  1)  Little interest or pleasure in doing things:  2  2) Feeling down, depressed or hopeless:1  3) Trouble falling asleep, staying asleep or sleeping too much:3  4) Feeling tired or having little energy:1  5) Poor appetite or overeating:0  6) Feeling bad about yourself - or that you're a failure or have let yourself or your family down:1  7) Trouble concentrating on things, such as reading the newspaper or watching TV: 0  8) Moving or speaking so slowly that other people could have noticed. Or, the opposite - being so fidgety or restless that you have been moving around a lot more than usual:0  9) Thoughts that you would be better off dead or of hurting yourself in some way:0    GAD7 score: 9  Feeling nervous, anxious or on edge:   1  Not being able to stop or control worrying:1  Worrying too much about different things:1  Trouble relaxing:  2  Being so restless that it is hard to sit still:  1  Becoming easily annoyed or irritable:  2  Feeling afraid as if something awful might happen:   If any of the above were scored more  than 0, how difficult have these problems made it for you to do your work, take care of things at home, or get along with other people?    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 Alcohol/week: 0.0 standard drinks    Comment: glasses of wine occasional, less than once a month     Social History     Substance and Sexual Activity   Drug Use No        Review of Systems:  - Constitutional Symptoms: no fevers, chills, weight loss  - Cardiovascular:  no palpitations, chest pain  - Respiratory: no cough or shortness of breath    No LMP recorded. Patient has had an  ablation.    I reviewed the following:  Past Medical History:   Diagnosis Date   ??? Anemia     Last CBC: h/h 10.1/29.8 (10/2008)   ??? Genital herpes    ??? History of loop electrical excision procedure (LEEP)    ??? 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 Repeat Pap in 1 year   ??? Hypertension    ??? Migraine     None in years, 3y, since have a piercing in upper ear after someone suggested this could help   ??? Vitamin D deficiency 10/14/2013       Current Outpatient Medications   Medication Sig Dispense Refill   ??? amLODIPine (NORVASC) 5 mg tablet TAKE 1 TABLET BY MOUTH EVERY DAY  Indications: high blood pressure 90 Tab 0   ??? indapamide (LOZOL) 1.25 mg tablet TAKE 1 TABLET BY MOUTH DAILY  Indications: high blood pressure 90 Tab 0   ??? valACYclovir (VALTREX) 1 gram tablet TAKE 1 TABLET BY MOUTH DAILY 90 Tab 2   ??? multivitamin (ONE A DAY) tablet Take 1 Tab by mouth daily.         Allergies   Allergen Reactions   ??? Influenza Virus Vaccine, Specific Swelling     Happened 2 years in a row.   ??? Lexapro [Escitalopram] Other (comments)     insomnia   ??? Lisinopril Angioedema     Lip swelling on 07/10/13 while taking lisinopril-HCTZ       O: VS:   Visit Vitals  BP 120/88 (BP 1 Location: Left arm, BP Patient Position: Standing)   Pulse 71   Temp 97.7 ??F (36.5 ??C) (Oral)   Resp 16   Ht 5\' 7"  (1.702 m)   Wt 166 lb (75.3 kg)   SpO2 100%   BMI 26.00 kg/m??       GENERAL: Michelle Benitez is in no acute distress. Non-toxic. Well nourished. Well developed. Appropriately groomed.  RESP: Breath sounds are symmetrical bilaterally. Unlabored without SOB. Speaking in full sentences. Clear to auscultation bilaterally anteriorly and posteriorly.  No wheezes.  No rales or rhonchi.    CV: normal rate.  Regular rhythm. S1, S2 audible.  No murmur noted.  No rubs, clicks or gallops noted.   NEURO:  awake,  alert and oriented to person, place, and time and event.  Clear speech.  Muscle strength is +5/5 x 4  extremities.  Sensation is intact to light touch bilaterally.  Steady gait.  PSYCH: appropriate behavior, dress and thought processes.  Good eye contact.  Clear and coherent speech. Full affect. Good insight.   ____________________________________________________________________  I spent >25 minutes face to face with patient with >50% of time spent in counseling and coordinating care.  Patient education was done.  Advised on nutrition, physical activity, weight management, tobacco, alcohol and safety, including suicide hotline.  Counseling included discussion of diagnosis, differentials, treatment options, prescribed treatment, warning signs and follow up. Medication risks/benefits, costs interactions and alternatives discussed with patient.    ??  Patient agreed to plan of care and verbalized understanding.  Patient was given an after visit summary which included current diagnoses, medications and vital signs.    Pt agrees to follow up in 4 weeks.

## 2018-02-21 ENCOUNTER — Encounter: Payer: Self-pay | Admitting: Pulmonary Disease

## 2018-02-21 ENCOUNTER — Ambulatory Visit (INDEPENDENT_AMBULATORY_CARE_PROVIDER_SITE_OTHER): Payer: BLUE CROSS/BLUE SHIELD | Admitting: Pulmonary Disease

## 2018-02-21 VITALS — BP 114/74 | HR 91 | Ht 63.0 in | Wt 151.4 lb

## 2018-02-21 DIAGNOSIS — R0683 Snoring: Secondary | ICD-10-CM

## 2018-02-21 NOTE — Progress Notes (Signed)
   Subjective:    Patient ID: Ruth Schmidt, female    DOB: December 23, 1970, 47 y.o.   MRN: [id number]  HPI    Review of Systems  Constitutional: Positive for fatigue. Negative for fever and unexpected weight change.  HENT: Positive for nosebleeds. Negative for congestion, dental problem, ear pain, postnasal drip, rhinorrhea, sinus pressure, sneezing, sore throat and trouble swallowing.   Eyes: Positive for redness and itching.  Respiratory: Negative for cough, chest tightness, shortness of breath and wheezing.   Cardiovascular: Negative for palpitations and leg swelling.  Gastrointestinal: Negative for nausea and vomiting.  Genitourinary: Negative for dysuria.  Musculoskeletal: Negative for joint swelling.  Skin: Negative for rash.  Allergic/Immunologic: Positive for environmental allergies. Negative for food allergies and immunocompromised state.  Neurological: Positive for headaches.  Hematological: Does not bruise/bleed easily.  Psychiatric/Behavioral: Negative for dysphoric mood. The patient is not nervous/anxious.        Objective:   Physical Exam        Assessment & Plan:

## 2018-02-21 NOTE — Patient Instructions (Signed)
Will arrange for home sleep study Will call to arrange for follow up after sleep study reviewed  

## 2018-02-21 NOTE — Progress Notes (Signed)
[facility]  Chief Complaint  Patient presents with  . sleep consult    Pt referred by Alphonse Guild NP for possible OSA. Pt sleeps about 5-6 hours wakes up 2-3 times each night, and has daytime sleepiness.     Constitutional:  BP 114/74 (BP Location: Left Arm, Cuff Size: Normal)   Pulse 91   Ht 5\' 3"  (1.6 m)   Wt 151 lb 6.4 oz (68.7 kg)   SpO2 100%   BMI 26.82 kg/m   Past Medical History:  Hypertension  Brief Summary:  Ruth Schmidt is a 47 y.o. female with snoring.  She has noticed more trouble with her sleep for the past several months.  This has been getting worse.  Her children have told her that she snores.  She can't sleep on her back.  She feels tired during the day.  She has tried napping, but this doesn't seem to help.  She gets cramps in her legs at night.  She was getting headaches, but this improved after she was started on blood pressure medicine.  She goes to sleep between 11 pm and 1230 am  She falls asleep after about an hour.  She has to keep the TV on while she is falling asleep.  She wakes up some times to use the bathroom.  She gets out of bed at 530 am.  She feels tired in the morning.  She does not use anything to help her fall sleep.  She drinks coffee in the morning.  She denies sleep walking, sleep talking, bruxism, or nightmares.  There is no history of restless legs.  She denies sleep hallucinations, sleep paralysis, or cataplexy.  The Epworth score is 15 out of 24.     Physical Exam:   Appearance - well kempt   ENMT - clear nasal mucosa, midline nasal  septum, no oral exudates, no LAN, trachea midline, MP 4, enlarged tongue  Respiratory - normal chest wall, normal respiratory effort, no accessory muscle use, no wheeze/rales  CV - s1s2 regular rate and rhythm, no murmurs, no peripheral edema, radial pulses symmetric  GI - soft, non tender, no masses  Lymph - no adenopathy noted in neck and  axillary areas  MSK - normal gait  Ext - no cyanosis, clubbing, or joint inflammation noted  Skin - no rashes, lesions, or ulcers  Neuro - normal strength, oriented x 3  Psych - normal mood and affect  Discussion:  She has snoring, sleep disruption, apnea, and daytime sleepiness.  She has history of hypertension.  I am concerned she could have sleep apnea.  Assessment/Plan:   Snoring with excessive daytime sleepiness. - will need to arrange for a home sleep study if this allowed by her insurance; otherwise will arrange for in lab study  Obesity. - discussed how weight can impact sleep and risk for sleep disordered breathing - discussed options to assist with weight loss: combination of diet modification, cardiovascular and strength training exercises  Cardiovascular risk. - had an extensive discussion regarding the adverse health consequences related to untreated sleep disordered breathing - specifically discussed the risks for hypertension, coronary artery disease, cardiac dysrhythmias, cerebrovascular disease, and diabetes - lifestyle modification discussed  Safe driving practices. - discussed how sleep disruption can increase risk of accidents, particularly when driving - safe driving practices were discussed  Therapies for obstructive sleep apnea. - if the sleep study shows significant sleep apnea, then various therapies for treatment were reviewed: CPAP,  oral appliance, and surgical interventions  Patient Instructions  Will arrange for home sleep study Will call to arrange for follow up after sleep study reviewed   Coralyn HellingVineet Donique Hammonds, MD  Pulmonary/Critical Care Pager: [phone] 02/21/2018, 4:57 PM  Flow Sheet    Sleep tests:    Review of Systems:  Constitutional: Positive for fatigue. Negative for fever and unexpected weight change.  HENT: Positive for nosebleeds. Negative for congestion, dental problem, ear pain, postnasal drip, rhinorrhea, sinus  pressure, sneezing, sore throat and trouble swallowing.   Eyes: Positive for redness and itching.  Respiratory: Negative for cough, chest tightness, shortness of breath and wheezing.   Cardiovascular: Negative for palpitations and leg swelling.  Gastrointestinal: Negative for nausea and vomiting.  Genitourinary: Negative for dysuria.  Musculoskeletal: Negative for joint swelling.  Skin: Negative for rash.  Allergic/Immunologic: Positive for environmental allergies. Negative for food allergies and immunocompromised state.  Neurological: Positive for headaches.  Hematological: Does not bruise/bleed easily.  Psychiatric/Behavioral: Negative for dysphoric mood. The patient is not nervous/anxious.    Medications:   Allergies as of 02/21/2018   No Known Allergies     Medication List        Accurate as of 02/21/18  4:57 PM. Always use your most recent med list.          amLODipine 5 MG tablet Commonly known as:  NORVASC Take 1 tablet (5 mg total) by mouth daily.   fluticasone 50 MCG/ACT nasal spray Commonly known as:  FLONASE Place 2 sprays into both nostrils daily.       Past Surgical History:  She  has a past surgical history that includes Cesarean section and Tubal ligation (Bilateral, 2005).  Family History:  Her family history includes Breast cancer (age of onset: 3768) in her maternal aunt; Cancer in her unknown relative; Colon cancer in her maternal uncle; Coronary artery disease (age of onset: 4070) in her maternal grandfather; Diabetes in her father and paternal grandmother; Hyperlipidemia in her father; Hypertension in her maternal grandmother and mother; Lung cancer in her maternal uncle; Stroke (age of onset: 7265) in her maternal aunt.  Social History:  She  reports that she has never smoked. She has never used smokeless tobacco. She reports that she drinks alcohol. She reports that she does not use drugs.

## 2018-03-05 ENCOUNTER — Telehealth: Payer: Self-pay | Admitting: Pulmonary Disease

## 2018-03-05 NOTE — Telephone Encounter (Signed)
On 12/12 I LM on pt's VM to schedule home sleep study.  I called pt back again today & h ad to LM on VM.  I again provided pt with my direct number to schedule this.

## 2018-03-05 NOTE — Telephone Encounter (Signed)
[facility]

## 2018-03-05 NOTE — Telephone Encounter (Signed)
ATC Patient.  [facility]

## 2018-03-06 NOTE — Telephone Encounter (Signed)
I once again attempted to reach the patient today regarding the sleep study.  Pt has returned my call to schedule sleep study.  I will continue to reach out to the pt to schedule the sleep study.

## 2018-03-07 ENCOUNTER — Ambulatory Visit: Admit: 2018-03-07 | Payer: PRIVATE HEALTH INSURANCE | Attending: Neurology | Primary: Family

## 2018-03-07 ENCOUNTER — Ambulatory Visit: Attending: Neurology | Primary: Family

## 2018-03-07 DIAGNOSIS — R413 Other amnesia: Secondary | ICD-10-CM

## 2018-03-07 NOTE — Telephone Encounter (Signed)
LM on pt's VM asking pt to return my call to schedule sleep study.

## 2018-03-07 NOTE — Progress Notes (Signed)
Identified pt with two pt identifiers(name and DOB). Reviewed record in preparation for visit and have obtained necessary documentation.  Chief Complaint   Patient presents with   ??? Follow-up     Memory loss        Health Maintenance Due   Topic   ??? PAP AKA CERVICAL CYTOLOGY    ??? Influenza Age 47 to Adult        Coordination of Care Questionnaire:  :   1) Have you been to an emergency room, urgent care, or hospitalized since your last visit?  If yes, where when, and reason for visit? yes [facility] ED for dizziness and nausea 02/07/18    2. Have seen or consulted any other health care provider since your last visit?   If yes, where when, and reason for visit?  no      3) Do you have an Advanced Directive/ Living Will in place? NO  If yes, do we have a copy on file NO  If no, would you like information NO    Patient is accompanied by self I have received verbal consent from Arv[place] Malachi to discuss any/all medical information while they are present in the room.    Visit Vitals  BP 132/84 (BP 1 Location: Left arm, BP Patient Position: Sitting)   Pulse 76   Resp 16   Ht 5' 7" (1.702 m)   Wt 76.7 kg (169 lb)   SpO2 99%   BMI 26.47 kg/m??     Onur Mori W Carold Eisner, LPN

## 2018-03-07 NOTE — Progress Notes (Signed)
Neurology Consult Note      HISTORY PROVIDED BY: Michelle Benitez    Chief Complaint:   Chief Complaint   Michelle Benitez presents with   ??? Follow-up     Memory loss      Subjective:   Pt is a 47 y.o. right handed female initially and last seen in clinic on 10/06/17 with memory loss since 2015 which she feels is progressive, most recently forgetting her home address of 8 years. Describes losing her train of thought mid-sentence.  Exam was non-focal and unremarkable.  Discussed the possibility that cognitive difficulties are due to aging, chronic sleep deprivation, and stress, as well as contribution from B12 and Vit D deficiency, not on replacement therapy.  DDx included seizure and early dementia.  Recommended MRI brain wo contrast to assess for structural etiology, EEG to assess for underlying epilepsy, and "memory labs" B12/MMA, Vit D, RPR, TSH.  Recommended she work on getting 7-8 hours of sleep a night.    She returns for f/u. B12/MMA, TSH were normal. Vit D level low - 28.1, recommended she take an OTC Vit D3 supplement 1000units/day.  MRI brain wo contrast 10/27/17 is normal.  She did not have the EEG done, doesn't recall that I ordered it.  No concerning spells since last visit.  She was just in the ED on 02/07/18 with c/o HA, lightheadedness, blurry vision, and nausea on standing while at work. CTH, CTA H/N were normal.  Pt reports that she has not taken her BP meds yesterday or today, just forgot and had a busy morning this morning.  Still having some lightheadedness, notes that she was not drinking or eating throughout the day b/c she doesn't have coverage in her class to go to the bathroom. She has increased her water intake since her ED visit.  She feels like her memory has improved. She is getting more sleep, "not as much as she needs."  She is going to [place] tomorrow to see her father.     Past Medical History:   Diagnosis Date   ??? Anemia     Last CBC: h/h 10.1/29.8 (10/2008)   ??? Genital herpes     ??? History of loop electrical excision procedure (LEEP)    ??? 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 Repeat Pap in 1 year   ??? Hypertension    ??? Migraine     None in years, 3y, since have a piercing in upper ear after someone suggested this could help   ??? Vitamin D deficiency 10/14/2013      Past Surgical History:   Procedure Laterality Date   ??? [place], 2012    x2   ??? HX DILATION AND CURETTAGE  04/14/14   ??? HX LEEP PROCEDURE  02/2013    with colposcopy   ??? HX OTHER SURGICAL Right 2007, 02/2013    Eye surgery, ptyergium removal   ??? HX TUBAL LIGATION  01/2011      Social History     Socioeconomic History   ??? Marital status: SINGLE     Spouse name: Not on file   ??? Number of children: 3   ??? Years of education: Not on file   ??? Highest education level: Not on file   Occupational History   ??? 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 Occupation: independent Armed forces operational officer     Comment:  sales products, in partnership with [employer]   ??? Financial resource strain: Not on file   ??? Food insecurity:     Worry: Not on file     Inability: Not on file   ??? Transportation needs:     Medical: Not on file     Non-medical: Not on file   Tobacco Use   ??? Smoking status: Never Smoker   ??? Smokeless tobacco: Never Used   Substance and Sexual Activity   ??? Alcohol use: Yes     Alcohol/week: 0.0 standard drinks     Comment: glasses of wine occasional, less than once a month   ??? Drug use: No   ??? Sexual activity: Yes     Partners: Male     Birth control/protection: None, Surgical     Comment: monogamous with partner since about 2014   Lifestyle   ??? Physical activity:     Days per week: Not on file     Minutes per session: Not on file   ??? Stress: Not on file   Relationships   ??? Social connections:     Talks on phone: Not on file     Gets together: Not on file     Attends religious service: Not on file      Active member of club or organization: Not on file     Attends meetings of clubs or organizations: Not on file     Relationship status: Not on file   ??? Intimate partner violence:     Fear of current or ex partner: Not on file     Emotionally abused: Not on file     Physically abused: Not on file     Forced sexual activity: Not on file   Other Topics Concern   ??? Not on file   Social History Narrative    Lives in [place] with two sons (one in school at [employer]), one dtr (after [place]), mother  Originally from [place]         Earned EdD at [employer] in 2018 in education.      Family History   Problem Relation Age of Onset   ??? Hypertension Mother    ??? Cancer Maternal Grandmother         cervical?   ??? Cancer Maternal Aunt         cervical?   ??? Other Father         Just had legs amputated due to blood clots and gangrene, no DM   ??? Hypertension Sister    ??? No Known Problems Brother    ??? No Known Problems Maternal Grandfather    ??? Diabetes Paternal Grandmother    ??? No Known Problems Paternal Grandfather    ??? No Known Problems Brother    ??? Hypertension Sister    ??? No Known Problems Sister    ??? No Known Problems Son    ??? No Known Problems Son    ??? No Known Problems Daughter    ??? Breast Cancer Neg Hx          Objective:   ROS:  Per HPI o/w neg.     Allergies   Allergen Reactions   ??? Influenza Virus Vaccine, Specific Swelling     Happened 2 years in a row.   ??? Lexapro [Escitalopram] Other (comments)     insomnia   ??? Lisinopril Angioedema     Lip swelling on 07/10/13 while  taking lisinopril-HCTZ        Meds:  Outpatient Medications Prior to Visit   Medication Sig Dispense Refill   ??? solifenacin (VESICARE) 5 mg tablet Take 1 Tab by mouth daily. 90 Tab 1   ??? amLODIPine (NORVASC) 5 mg tablet TAKE 1 TABLET BY MOUTH EVERY DAY  Indications: high blood pressure 90 Tab 0   ??? indapamide (LOZOL) 1.25 mg tablet TAKE 1 TABLET BY MOUTH DAILY  Indications: high blood pressure 90 Tab 0    ??? valACYclovir (VALTREX) 1 gram tablet TAKE 1 TABLET BY MOUTH DAILY 90 Tab 2     No facility-administered medications prior to visit.        Imaging:  MRI Results (most recent):  Results from Hospital Encounter encounter on 10/27/17   MRI BRAIN WO CONT    Narrative EXAM: MRI BRAIN WO CONT    INDICATION: Pt with memory loss, progressively worsening over last few years,  eval for structural etiology    COMPARISON: None.    CONTRAST: None.    TECHNIQUE:    Multiplanar multisequence acquisition without contrast of the brain.    FINDINGS:  The ventricles are normal in size and position. Minimal scattered white matter  T2/FLAIR hyperintensities, most prominent in the periatrial regions, as well as  minimal patchy T2/FLAIR hyperintensity in the pons, consistent with minimal  chronic microvascular ischemic disease. There is no acute infarct, hemorrhage,  extra-axial fluid collection, or mass effect. There is no cerebellar tonsillar  herniation. Expected arterial flow-voids are present.    The paranasal sinuses, mastoid air cells, and middle ears are clear. The orbital  contents are within normal limits. No significant osseous or scalp lesions are  identified.      Impression IMPRESSION:     1. No evidence of acute or significant intracranial abnormality. Minimal chronic  microvascular ischemic disease.        CT Results (most recent):  Results from Hospital Encounter encounter on 02/07/18   CTA NECK    Narrative INDICATION:    EXAMINATION:  CT ANGIOGRAPHY HEAD AND NECK     COMPARISON: None     TECHNIQUE:  Following the uneventful administration of iodinated contrast  material, axial CT angiography of the head and neck was performed. Delayed axial  images through the head were also obtained. Coronal and sagittal reconstructions  were obtained. Manual postprocessing of images was performed. 3-D  Sagittal  maximal intensity projection images were obtained.  3-D Coronal maximal   intensity projections were obtained.  CT dose reduction was achieved through use  of a standardized protocol tailored for this examination and automatic exposure  control for dose modulation.    FINDINGS:    CTA NECK:    Aortic Arch: No significant abnormality.  Right Common Carotid Artery: No significant abnormality.  Right Internal Carotid Artery: No significant abnormality.  NASCET Right: 0-25%.  Left Common Carotid Artery: No significant abnormality.  Left Internal Carotid Artery: No significant abnormality.  NASCET Left: 0-25%..  Carotid stenosis determined using NASCET criteria.  Right Vertebral Artery: No significant abnormality.  Left Vertebral Artery: No significant abnormality.  Cervical Soft Tissues: No significant abnormality.  Lung Apices: No significant abnormality.  Bones: No destructive bone lesion.  Additional Comments: N/A.    CTA HEAD:    Posterior Circulation: No flow limiting stenosis or occlusion.  Anterior Circulation: No flow limiting stenosis or occlusion.  Additional Comments: 2 mm conical outpouching origin the left posterior  communicating artery most consistent with infundibulum.  No evidence of aneurysm.      Impression IMPRESSION:    No acute process. No arterial thrombosis/occlusion, dissection, or flow-limiting  stenosis.        Reviewed records in [facility] and media tab today    Lab Review   Results for orders placed or performed during the hospital encounter of 02/07/18   CBC WITH AUTOMATED DIFF   Result Value Ref Range    WBC 2.8 (L) 3.6 - 11.0 K/uL    RBC 4.06 3.80 - 5.20 M/uL    HGB 13.0 11.5 - 16.0 g/dL    HCT 40.4 35.0 - 47.0 %    MCV 99.5 (H) 80.0 - 99.0 FL    MCH 32.0 26.0 - 34.0 PG    MCHC 32.2 30.0 - 36.5 g/dL    RDW 13.6 11.5 - 14.5 %    PLATELET 225 150 - 400 K/uL    MPV 11.4 8.9 - 12.9 FL    NRBC 0.0 0 PER 100 WBC    ABSOLUTE NRBC 0.00 0.00 - 0.01 K/uL    NEUTROPHILS 45 32 - 75 %    LYMPHOCYTES 44 12 - 49 %    MONOCYTES 9 5 - 13 %    EOSINOPHILS 1 0 - 7 %     BASOPHILS 1 0 - 1 %    IMMATURE GRANULOCYTES 0 0.0 - 0.5 %    ABS. NEUTROPHILS 1.3 (L) 1.8 - 8.0 K/UL    ABS. LYMPHOCYTES 1.2 0.8 - 3.5 K/UL    ABS. MONOCYTES 0.3 0.0 - 1.0 K/UL    ABS. EOSINOPHILS 0.0 0.0 - 0.4 K/UL    ABS. BASOPHILS 0.0 0.0 - 0.1 K/UL    ABS. IMM. GRANS. 0.0 0.00 - 0.04 K/UL    DF AUTOMATED     METABOLIC PANEL, COMPREHENSIVE   Result Value Ref Range    Sodium 139 136 - 145 mmol/L    Potassium 3.1 (L) 3.5 - 5.1 mmol/L    Chloride 106 97 - 108 mmol/L    CO2 29 21 - 32 mmol/L    Anion gap 4 (L) 5 - 15 mmol/L    Glucose 86 65 - 100 mg/dL    BUN 11 6 - 20 MG/DL    Creatinine 0.98 0.55 - 1.02 MG/DL    BUN/Creatinine ratio 11 (L) 12 - 20      GFR est AA >60 >60 ml/min/1.63m    GFR est non-AA >60 >60 ml/min/1.782m   Calcium 9.8 8.5 - 10.1 MG/DL    Bilirubin, total 0.8 0.2 - 1.0 MG/DL    ALT (SGPT) 16 12 - 78 U/L    AST (SGOT) 12 (L) 15 - 37 U/L    Alk. phosphatase 54 45 - 117 U/L    Protein, total 8.6 (H) 6.4 - 8.2 g/dL    Albumin 4.2 3.5 - 5.0 g/dL    Globulin 4.4 (H) 2.0 - 4.0 g/dL    A-G Ratio 1.0 (L) 1.1 - 2.2     PROTHROMBIN TIME + INR   Result Value Ref Range    INR 1.0 0.9 - 1.1      Prothrombin time 10.3 9.0 - 11.1 sec   GLUCOSE, POC   Result Value Ref Range    Glucose (POC) 65 65 - 100 mg/dL    Performed by LITTLE JACOB    GLUCOSE, POC   Result Value Ref Range    Glucose (POC) 67 65 - 100 mg/dL    Performed by  LITTLE JACOB    GLUCOSE, POC   Result Value Ref Range    Glucose (POC) 88 65 - 100 mg/dL    Performed by LITTLE JACOB    EKG, 12 LEAD, INITIAL   Result Value Ref Range    Ventricular Rate 51 BPM    Atrial Rate 51 BPM    P-R Interval 224 ms    QRS Duration 84 ms    Q-T Interval 420 ms    QTC Calculation (Bezet) 387 ms    Calculated P Axis 61 degrees    Calculated R Axis 18 degrees    Calculated T Axis 47 degrees    Diagnosis       Sinus bradycardia with 1st degree AV block RSR' or QR pattern in V1 suggests   right ventricular conduction delay  Septal infarct , age undetermined   No previous ECGs available  Confirmed by Shelly Rubenstein, M.D., [place] [phone]) on 02/07/2018 4:59:48 PM          Exam:  Visit Vitals  BP 132/84 (BP 1 Location: Left arm, BP Michelle Benitez Position: Sitting) Comment: pt has not taken bp meds in 2 days   Pulse 76   Resp 16   Ht 5' 7"  (1.702 m)   Wt 76.7 kg (169 lb)   SpO2 99%   BMI 26.47 kg/m??     General:  Alert, cooperative, no distress.   Head:  Normocephalic, without obvious abnormality, atraumatic.   Respiratory:  Heart:   Non labored breathing  Regular rate and rhythm, no murmurs   Neck:      Extremities: Warm, no cyanosis or edema.   Pulses: 2+ radial pulses.       Neurologic:  MS: Alert and oriented x 4, speech intact. Language- see MMSE Attention and fund of knowledge appropriate.  Recent and remote memory intact.  Cranial Nerves:  II: visual fields    II: pupils    II: optic disc    III,VII: ptosis none   III,IV,VI: extraocular muscles  EOMI, no nystagmus   V: facial light touch sensation     VII: facial muscle function   symmetric   VIII: hearing intact   IX: soft palate elevation     XI: trapezius strength     XI: sternocleidomastoid strength    XII: tongue       At 09/2017 OV:  Motor: normal bulk and tone, no tremor              Strength: 5/5 throughout, no PD  Sensory: intact to LT, PP  Coordination: FTN and HTS intact, RAM intact  Gait: normal gait, able to tandem walk  Reflexes: 2+ symmetric, toes downgoing    Mini Mental State Exam 03/07/2018   What is the Year 1   What is the Season 1   What is the Date 1   What is the Day 1   What is the Month 1   Where are we State 1   Where are we Country 1   Where are we [place] or [place] 1   Where are we Floor 1   Name three objects, then ask the Michelle Benitez to say them 3   Serial sevens Subtract 7 from 100 in increments 5   Ask for the three objects repeated above 3   Name a pencil 1   Name a watch 1   Have the Michelle Benitez repeat this phrase "No ifs, ands, or buts" 1   Three stage command: Take the  paper in your right hand 1    Fold the paper in half 1   Put the paper on the floor 1   Read and obey the following: CLOSE YOUR EYES 1   Have the Michelle Benitez write a sentence 1   Have the Michelle Benitez copy a figure 1   Mini Mental Score 30                Assessment/Plan   Pt is a 47 y.o. right handed female initially presenting in July, 2019 with c/o memory loss since 2015 which she felt was progressive, gave examples of forgetting her address of 8 years and losing train of thought. Cognitive difficulties have improved with improved sleep. New c/o intermittent lightheadedness with standing.  Exam with MMSE score of 30/30, o/w is non-focal and unremarkable. MRI brain wo contrast 10/27/17 is normal. Low suspicion for any concerning etiology for complaints.  Recommend getting the EEG ordered. Suspect LH due to little PO intake during the day, could be related to anithypertensive meds.  Recommend f/u with PCP.  F/u in 8 months to reassess memory.      ICD-10-CM ICD-9-CM    1. Memory loss R41.3 780.93        Signed:  Chauncey Reading, MD  03/07/2018

## 2018-03-07 NOTE — Progress Notes (Signed)
 Identified pt with two pt identifiers(name and DOB). Reviewed record in preparation for visit and have obtained necessary documentation.  Chief Complaint   Patient presents with   . Follow-up     Memory loss        Health Maintenance Due   Topic   . PAP AKA CERVICAL CYTOLOGY    . Influenza Age 47 to Adult        Coordination of Care Questionnaire:  :   1) Have you been to an emergency room, urgent care, or hospitalized since your last visit?  If yes, where when, and reason for visit? yes [facility] ED for dizziness and nausea 02/07/18    2. Have seen or consulted any other health care provider since your last visit?   If yes, where when, and reason for visit?  no      3) Do you have an Advanced Directive/ Living Will in place? NO  If yes, do we have a copy on file NO  If no, would you like information NO    Patient is accompanied by self I have received verbal consent from Mariah Bohr to discuss any/all medical information while they are present in the room.    Visit Vitals  BP 132/84 (BP 1 Location: Left arm, BP Patient Position: Sitting)   Pulse 76   Resp 16   Ht 5' 7 (1.702 m)   Wt 76.7 kg (169 lb)   SpO2 99%   BMI 26.47 kg/m     Ronal LELON Sharps, LPN

## 2018-03-07 NOTE — Progress Notes (Signed)
Neurology Consult Note      HISTORY PROVIDED BY: Michelle Benitez    Chief Complaint:   Chief Complaint   Michelle Benitez presents with   ??? Follow-up     Memory loss      Subjective:   Pt is a 47 y.o. right handed female initially and last seen in clinic on 10/06/17 with memory loss since 2015 which she feels is progressive, most recently forgetting her home address of 8 years. Describes losing her train of thought mid-sentence.  Exam was non-focal and unremarkable.  Discussed the possibility that cognitive difficulties are due to aging, chronic sleep deprivation, and stress, as well as contribution from B12 and Vit D deficiency, not on replacement therapy.  DDx included seizure and early dementia.  Recommended MRI brain wo contrast to assess for structural etiology, EEG to assess for underlying epilepsy, and "memory labs" B12/MMA, Vit D, RPR, TSH.  Recommended she work on getting 7-8 hours of sleep a night.    She returns for f/u. B12/MMA, TSH were normal. Vit D level low - 28.1, recommended she take an OTC Vit D3 supplement 1000units/day.  MRI brain wo contrast 10/27/17 is normal.  She did not have the EEG done, doesn't recall that I ordered it.  No concerning spells since last visit.  She was just in the ED on 02/07/18 with c/o HA, lightheadedness, blurry vision, and nausea on standing while at work. CTH, CTA H/N were normal.  Pt reports that she has not taken her BP meds yesterday or today, just forgot and had a busy morning this morning.  Still having some lightheadedness, notes that she was not drinking or eating throughout the day b/c she doesn't have coverage in her class to go to the bathroom. She has increased her water intake since her ED visit.  She feels like her memory has improved. She is getting more sleep, "not as much as she needs."  She is going to [place] tomorrow to see her father.     Past Medical History:   Diagnosis Date   ??? Anemia     Last CBC: h/h 10.1/29.8 (10/2008)   ??? Genital herpes    ??? History of loop  electrical excision procedure (LEEP)    ??? 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 Repeat Pap in 1 year   ??? Hypertension    ??? Migraine     None in years, 3y, since have a piercing in upper ear after someone suggested this could help   ??? Vitamin D deficiency 10/14/2013      Past Surgical History:   Procedure Laterality Date   ??? [place], 2012    x2   ??? HX DILATION AND CURETTAGE  04/14/14   ??? HX LEEP PROCEDURE  02/2013    with colposcopy   ??? HX OTHER SURGICAL Right 2007, 02/2013    Eye surgery, ptyergium removal   ??? HX TUBAL LIGATION  01/2011      Social History     Socioeconomic History   ??? Marital status: SINGLE     Spouse name: Not on file   ??? Number of children: 3   ??? Years of education: Not on file   ??? Highest education level: Not on file   Occupational History   ??? 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 Occupation: independent Armed forces operational officer     Comment:  sales products, in partnership with [employer]   ??? Financial resource strain: Not on file   ??? Food insecurity:     Worry: Not on file     Inability: Not on file   ??? Transportation needs:     Medical: Not on file     Non-medical: Not on file   Tobacco Use   ??? Smoking status: Never Smoker   ??? Smokeless tobacco: Never Used   Substance and Sexual Activity   ??? Alcohol use: Yes     Alcohol/week: 0.0 standard drinks     Comment: glasses of wine occasional, less than once a month   ??? Drug use: No   ??? Sexual activity: Yes     Partners: Male     Birth control/protection: None, Surgical     Comment: monogamous with partner since about 2014   Lifestyle   ??? Physical activity:     Days per week: Not on file     Minutes per session: Not on file   ??? Stress: Not on file   Relationships   ??? Social connections:     Talks on phone: Not on file     Gets together: Not on file     Attends religious service: Not on file     Active member of club or organization: Not on file      Attends meetings of clubs or organizations: Not on file     Relationship status: Not on file   ??? Intimate partner violence:     Fear of current or ex partner: Not on file     Emotionally abused: Not on file     Physically abused: Not on file     Forced sexual activity: Not on file   Other Topics Concern   ??? Not on file   Social History Narrative    Lives in [place] with two sons (one in school at [employer]), one dtr (after [place]), mother  Originally from [place]         Earned EdD at [employer] in 2018 in education.      Family History   Problem Relation Age of Onset   ??? Hypertension Mother    ??? Cancer Maternal Grandmother         cervical?   ??? Cancer Maternal Aunt         cervical?   ??? Other Father         Just had legs amputated due to blood clots and gangrene, no DM   ??? Hypertension Sister    ??? No Known Problems Brother    ??? No Known Problems Maternal Grandfather    ??? Diabetes Paternal Grandmother    ??? No Known Problems Paternal Grandfather    ??? No Known Problems Brother    ??? Hypertension Sister    ??? No Known Problems Sister    ??? No Known Problems Son    ??? No Known Problems Son    ??? No Known Problems Daughter    ??? Breast Cancer Neg Hx          Objective:   ROS:  Per HPI o/w neg.     Allergies   Allergen Reactions   ??? Influenza Virus Vaccine, Specific Swelling     Happened 2 years in a row.   ??? Lexapro [Escitalopram] Other (comments)     insomnia   ??? Lisinopril Angioedema     Lip swelling on 07/10/13 while  taking lisinopril-HCTZ        Meds:  Outpatient Medications Prior to Visit   Medication Sig Dispense Refill   ??? solifenacin (VESICARE) 5 mg tablet Take 1 Tab by mouth daily. 90 Tab 1   ??? amLODIPine (NORVASC) 5 mg tablet TAKE 1 TABLET BY MOUTH EVERY DAY  Indications: high blood pressure 90 Tab 0   ??? indapamide (LOZOL) 1.25 mg tablet TAKE 1 TABLET BY MOUTH DAILY  Indications: high blood pressure 90 Tab 0   ??? valACYclovir (VALTREX) 1 gram tablet TAKE 1 TABLET BY MOUTH DAILY 90 Tab 2     No facility-administered medications  prior to visit.        Imaging:  MRI Results (most recent):  Results from Hospital Encounter encounter on 10/27/17   MRI BRAIN WO CONT    Narrative EXAM: MRI BRAIN WO CONT    INDICATION: Pt with memory loss, progressively worsening over last few years,  eval for structural etiology    COMPARISON: None.    CONTRAST: None.    TECHNIQUE:    Multiplanar multisequence acquisition without contrast of the brain.    FINDINGS:  The ventricles are normal in size and position. Minimal scattered white matter  T2/FLAIR hyperintensities, most prominent in the periatrial regions, as well as  minimal patchy T2/FLAIR hyperintensity in the pons, consistent with minimal  chronic microvascular ischemic disease. There is no acute infarct, hemorrhage,  extra-axial fluid collection, or mass effect. There is no cerebellar tonsillar  herniation. Expected arterial flow-voids are present.    The paranasal sinuses, mastoid air cells, and middle ears are clear. The orbital  contents are within normal limits. No significant osseous or scalp lesions are  identified.      Impression IMPRESSION:     1. No evidence of acute or significant intracranial abnormality. Minimal chronic  microvascular ischemic disease.        CT Results (most recent):  Results from Hospital Encounter encounter on 02/07/18   CTA NECK    Narrative INDICATION:    EXAMINATION:  CT ANGIOGRAPHY HEAD AND NECK     COMPARISON: None     TECHNIQUE:  Following the uneventful administration of iodinated contrast  material, axial CT angiography of the head and neck was performed. Delayed axial  images through the head were also obtained. Coronal and sagittal reconstructions  were obtained. Manual postprocessing of images was performed. 3-D  Sagittal  maximal intensity projection images were obtained.  3-D Coronal maximal  intensity projections were obtained.  CT dose reduction was achieved through use  of a standardized protocol tailored for this examination and automatic  exposure  control for dose modulation.    FINDINGS:    CTA NECK:    Aortic Arch: No significant abnormality.  Right Common Carotid Artery: No significant abnormality.  Right Internal Carotid Artery: No significant abnormality.  NASCET Right: 0-25%.  Left Common Carotid Artery: No significant abnormality.  Left Internal Carotid Artery: No significant abnormality.  NASCET Left: 0-25%..  Carotid stenosis determined using NASCET criteria.  Right Vertebral Artery: No significant abnormality.  Left Vertebral Artery: No significant abnormality.  Cervical Soft Tissues: No significant abnormality.  Lung Apices: No significant abnormality.  Bones: No destructive bone lesion.  Additional Comments: N/A.    CTA HEAD:    Posterior Circulation: No flow limiting stenosis or occlusion.  Anterior Circulation: No flow limiting stenosis or occlusion.  Additional Comments: 2 mm conical outpouching origin the left posterior  communicating artery most consistent with infundibulum.  No evidence of aneurysm.      Impression IMPRESSION:    No acute process. No arterial thrombosis/occlusion, dissection, or flow-limiting  stenosis.        Reviewed records in [facility] and media tab today    Lab Review   Results for orders placed or performed during the hospital encounter of 02/07/18   CBC WITH AUTOMATED DIFF   Result Value Ref Range    WBC 2.8 (L) 3.6 - 11.0 K/uL    RBC 4.06 3.80 - 5.20 M/uL    HGB 13.0 11.5 - 16.0 g/dL    HCT 40.4 35.0 - 47.0 %    MCV 99.5 (H) 80.0 - 99.0 FL    MCH 32.0 26.0 - 34.0 PG    MCHC 32.2 30.0 - 36.5 g/dL    RDW 13.6 11.5 - 14.5 %    PLATELET 225 150 - 400 K/uL    MPV 11.4 8.9 - 12.9 FL    NRBC 0.0 0 PER 100 WBC    ABSOLUTE NRBC 0.00 0.00 - 0.01 K/uL    NEUTROPHILS 45 32 - 75 %    LYMPHOCYTES 44 12 - 49 %    MONOCYTES 9 5 - 13 %    EOSINOPHILS 1 0 - 7 %    BASOPHILS 1 0 - 1 %    IMMATURE GRANULOCYTES 0 0.0 - 0.5 %    ABS. NEUTROPHILS 1.3 (L) 1.8 - 8.0 K/UL    ABS. LYMPHOCYTES 1.2 0.8 - 3.5 K/UL    ABS. MONOCYTES 0.3  0.0 - 1.0 K/UL    ABS. EOSINOPHILS 0.0 0.0 - 0.4 K/UL    ABS. BASOPHILS 0.0 0.0 - 0.1 K/UL    ABS. IMM. GRANS. 0.0 0.00 - 0.04 K/UL    DF AUTOMATED     METABOLIC PANEL, COMPREHENSIVE   Result Value Ref Range    Sodium 139 136 - 145 mmol/L    Potassium 3.1 (L) 3.5 - 5.1 mmol/L    Chloride 106 97 - 108 mmol/L    CO2 29 21 - 32 mmol/L    Anion gap 4 (L) 5 - 15 mmol/L    Glucose 86 65 - 100 mg/dL    BUN 11 6 - 20 MG/DL    Creatinine 0.98 0.55 - 1.02 MG/DL    BUN/Creatinine ratio 11 (L) 12 - 20      GFR est AA >60 >60 ml/min/1.54m    GFR est non-AA >60 >60 ml/min/1.744m   Calcium 9.8 8.5 - 10.1 MG/DL    Bilirubin, total 0.8 0.2 - 1.0 MG/DL    ALT (SGPT) 16 12 - 78 U/L    AST (SGOT) 12 (L) 15 - 37 U/L    Alk. phosphatase 54 45 - 117 U/L    Protein, total 8.6 (H) 6.4 - 8.2 g/dL    Albumin 4.2 3.5 - 5.0 g/dL    Globulin 4.4 (H) 2.0 - 4.0 g/dL    A-G Ratio 1.0 (L) 1.1 - 2.2     PROTHROMBIN TIME + INR   Result Value Ref Range    INR 1.0 0.9 - 1.1      Prothrombin time 10.3 9.0 - 11.1 sec   GLUCOSE, POC   Result Value Ref Range    Glucose (POC) 65 65 - 100 mg/dL    Performed by LITTLE JACOB    GLUCOSE, POC   Result Value Ref Range    Glucose (POC) 67 65 - 100 mg/dL    Performed by  LITTLE JACOB    GLUCOSE, POC   Result Value Ref Range    Glucose (POC) 88 65 - 100 mg/dL    Performed by LITTLE JACOB    EKG, 12 LEAD, INITIAL   Result Value Ref Range    Ventricular Rate 51 BPM    Atrial Rate 51 BPM    P-R Interval 224 ms    QRS Duration 84 ms    Q-T Interval 420 ms    QTC Calculation (Bezet) 387 ms    Calculated P Axis 61 degrees    Calculated R Axis 18 degrees    Calculated T Axis 47 degrees    Diagnosis       Sinus bradycardia with 1st degree AV block RSR' or QR pattern in V1 suggests   right ventricular conduction delay  Septal infarct , age undetermined  No previous ECGs available  Confirmed by Shelly Rubenstein, M.D., [place] [phone]) on 02/07/2018 4:59:48 PM          Exam:  Visit Vitals  BP 132/84 (BP 1 Location: Left arm, BP Michelle Benitez  Position: Sitting) Comment: pt has not taken bp meds in 2 days   Pulse 76   Resp 16   Ht 5' 7"  (1.702 m)   Wt 76.7 kg (169 lb)   SpO2 99%   BMI 26.47 kg/m??     General:  Alert, cooperative, no distress.   Head:  Normocephalic, without obvious abnormality, atraumatic.   Respiratory:  Heart:   Non labored breathing  Regular rate and rhythm, no murmurs   Neck:      Extremities: Warm, no cyanosis or edema.   Pulses: 2+ radial pulses.       Neurologic:  MS: Alert and oriented x 4, speech intact. Language- see MMSE Attention and fund of knowledge appropriate.  Recent and remote memory intact.  Cranial Nerves:  II: visual fields    II: pupils    II: optic disc    III,VII: ptosis none   III,IV,VI: extraocular muscles  EOMI, no nystagmus   V: facial light touch sensation     VII: facial muscle function   symmetric   VIII: hearing intact   IX: soft palate elevation     XI: trapezius strength     XI: sternocleidomastoid strength    XII: tongue       At 09/2017 OV:  Motor: normal bulk and tone, no tremor              Strength: 5/5 throughout, no PD  Sensory: intact to LT, PP  Coordination: FTN and HTS intact, RAM intact  Gait: normal gait, able to tandem walk  Reflexes: 2+ symmetric, toes downgoing    Mini Mental State Exam 03/07/2018   What is the Year 1   What is the Season 1   What is the Date 1   What is the Day 1   What is the Month 1   Where are we State 1   Where are we Country 1   Where are we [place] or [place] 1   Where are we Floor 1   Name three objects, then ask the Michelle Benitez to say them 3   Serial sevens Subtract 7 from 100 in increments 5   Ask for the three objects repeated above 3   Name a pencil 1   Name a watch 1   Have the Michelle Benitez repeat this phrase "No ifs, ands, or buts" 1   Three stage command: Take the  paper in your right hand 1   Fold the paper in half 1   Put the paper on the floor 1   Read and obey the following: CLOSE YOUR EYES 1   Have the Michelle Benitez write a sentence 1   Have the Michelle Benitez copy a figure 1   Mini  Mental Score 30                Assessment/Plan   Pt is a 47 y.o. right handed female initially presenting in July, 2019 with c/o memory loss since 2015 which she felt was progressive, gave examples of forgetting her address of 8 years and losing train of thought. Cognitive difficulties have improved with improved sleep. New c/o intermittent lightheadedness with standing.  Exam with MMSE score of 30/30, o/w is non-focal and unremarkable. MRI brain wo contrast 10/27/17 is normal. Low suspicion for any concerning etiology for complaints.  Recommend getting the EEG ordered. Suspect LH due to little PO intake during the day, could be related to anithypertensive meds.  Recommend f/u with PCP.  F/u in 8 months to reassess memory.      ICD-10-CM ICD-9-CM    1. Memory loss R41.3 780.93        Signed:  Chauncey Reading, MD  03/07/2018

## 2018-03-09 NOTE — Telephone Encounter (Signed)
Attempted to call Patient.  Left message to call back to scheduled her sleep test.

## 2018-03-15 ENCOUNTER — Encounter: Attending: Family | Primary: Family

## 2018-03-15 DIAGNOSIS — L568 Other specified acute skin changes due to ultraviolet radiation: Secondary | ICD-10-CM | POA: Diagnosis not present

## 2018-03-15 DIAGNOSIS — L309 Dermatitis, unspecified: Secondary | ICD-10-CM | POA: Diagnosis not present

## 2018-03-22 ENCOUNTER — Encounter: Attending: Family | Primary: Family

## 2018-03-22 ENCOUNTER — Ambulatory Visit: Admit: 2018-03-22 | Discharge: 2018-03-22 | Payer: PRIVATE HEALTH INSURANCE | Attending: Family | Primary: Family

## 2018-03-22 ENCOUNTER — Inpatient Hospital Stay: Admit: 2018-03-23 | Payer: PRIVATE HEALTH INSURANCE | Primary: Family

## 2018-03-22 ENCOUNTER — Ambulatory Visit: Attending: Family | Primary: Family

## 2018-03-22 DIAGNOSIS — Z01419 Encounter for gynecological examination (general) (routine) without abnormal findings: Secondary | ICD-10-CM

## 2018-03-22 DIAGNOSIS — Z124 Encounter for screening for malignant neoplasm of cervix: Secondary | ICD-10-CM

## 2018-03-22 MED ORDER — AMLODIPINE 5 MG TAB
5 mg | ORAL_TABLET | ORAL | 3 refills | Status: DC
Start: 2018-03-22 — End: 2019-03-04

## 2018-03-22 MED ORDER — INDAPAMIDE 1.25 MG TAB
1.25 mg | ORAL_TABLET | ORAL | 3 refills | Status: DC
Start: 2018-03-22 — End: 2019-03-04

## 2018-03-22 MED ORDER — VESICARE 5 MG TABLET
5 mg | ORAL_TABLET | Freq: Every day | ORAL | 0 refills | Status: DC
Start: 2018-03-22 — End: 2018-03-29

## 2018-03-22 NOTE — Progress Notes (Signed)
S: Michelle Benitez is a 48 y.o. BL[place] female who presents for CPE    Assessment/Plan:  1. Well woman exam with routine gynecological exam  -discussed healthy diet and increasing daily exercise  -labs today: CBC, CMP, urinalysis, A1c, lipid,   -HM: Pap and NuSwab today, allergic to flu vaccine, STD testing    2. Essential hypertension with goal blood pressure less than 140/90  -current therapy: amlodipine 5mg  + indapamide 1.25mg    BP today - 129/89    3. Blood in stool  -REFERRAL TO GASTROENTEROLOGY    4. Urinary frequency  -current therapy: solifenacin 5mg  - not working, requesting brand, VESICARE which worked previously    5. Depression/Anxiety  -PHQ = 1, GAD = 3    RTC 1 year for CPE, pending lab results     HPI:    HTN  Current therapy: amlodipine 5mg  + indapamide 1.25mg    BP today - 129/89    Blood in stool -   has been assessed in past, no colonoscopy done at that time  Was told they weren't hemorrhoids, but given an ointment and sitz bath tx    OAB  Medication not working well - not vesicare, generic version. Pt requesting brand med     PHQ-9 Score: 1  Over the past 2 weeks, how often have you been bothered by any of the following problems? (Not at all = 0; several days = 1; More than 1/2 the days = 2; nearly every day = 3)  1) Little interest or pleasure in doing things:  0  2) Feeling down, depressed or hopeless:0  3) Trouble falling asleep, staying asleep or sleeping too much:0  4) Feeling tired or having little energy:0  5) Poor appetite or overeating:1  6) Feeling bad about yourself - or that you're a failure or have let yourself or your family down:0  7) Trouble concentrating on things, such as reading the newspaper or watching TV: 0  8) Moving or speaking so slowly that other people could have noticed. Or, the opposite - being so fidgety or restless that you have been moving around a lot more than usual:0  9) Thoughts that you would be better off dead or of hurting yourself in  some way:0    GAD7 score: 3  Feeling nervous, anxious or on edge:   0  Not being able to stop or control worrying:1  Worrying too much about different things:1  Trouble relaxing:  0  Being so restless that it is hard to sit still:  0  Becoming easily annoyed or irritable:  0  Feeling afraid as if something awful might happen: 1  If any of the above were scored more than 0, how difficult have these problems made it for you to do your work, take care of things at home, or get along with other people? Not at all     Social history:   Nutrition: overall healthy   Drinking 3 bottles of water   Physical:  Going to gym   Social: has 2 kids   Occupation: Runner, broadcasting/film/video    Social History     Tobacco Use   Smoking Status Never Smoker   Smokeless Tobacco Never Used     Social History     Substance and Sexual Activity   Alcohol Use Yes   ??? Alcohol/week: 0.0 standard drinks    Comment: glasses of wine occasional, less than once a month     Social History  Substance and Sexual Activity   Drug Use No     Social History     Substance and Sexual Activity   Sexual Activity Yes   ??? Partners: Male   ??? Birth control/protection: None, Surgical    Comment: monogamous with partner since about 2014       No LMP recorded. Patient has had an ablation.    Review of Systems:  - Constitutional Symptoms: no fevers/chills  - Eyes: no blurry vision or double vision  - Cardiovascular: no chest pain or palpitations  - Respiratory: no cough or shortness of breath  - Gastrointestinal: no dysphagia or abdominal pain  - Musculoskeletal: no joint pains or weakness  - Integumentary: no rashes  - GU: no vaginal discharge, itching, dyspareunia.  - Neurological: no numbness, tingling, or headaches  - Endocrine:  no heat or cold intolerance, no polyuria or polydipsia  ROS is negative otherwise.    I reviewed the following:  Past Medical History:   Diagnosis Date   ??? Anemia     Last CBC: h/h 10.1/29.8 (10/2008)   ??? Genital herpes     ??? History of loop electrical excision procedure (LEEP)    ??? 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 Repeat Pap in 1 year   ??? Hypertension    ??? Migraine     None in years, 3y, since have a piercing in upper ear after someone suggested this could help   ??? Vitamin D deficiency 10/14/2013       Current Outpatient Medications   Medication Sig Dispense Refill   ??? solifenacin (VESICARE) 5 mg tablet Take 1 Tab by mouth daily. 90 Tab 1   ??? amLODIPine (NORVASC) 5 mg tablet TAKE 1 TABLET BY MOUTH EVERY DAY  Indications: high blood pressure 90 Tab 0   ??? indapamide (LOZOL) 1.25 mg tablet TAKE 1 TABLET BY MOUTH DAILY  Indications: high blood pressure 90 Tab 0   ??? valACYclovir (VALTREX) 1 gram tablet TAKE 1 TABLET BY MOUTH DAILY 90 Tab 2       Allergies   Allergen Reactions   ??? Influenza Virus Vaccine, Specific Swelling     Happened 2 years in a row.   ??? Lexapro [Escitalopram] Other (comments)     insomnia   ??? Lisinopril Angioedema     Lip swelling on 07/10/13 while taking lisinopril-HCTZ        Health Maintenance:  PAP: needs one  Mammogram: 01/2017  Colonoscopy: no fam hx   Tdap: 2017  Flu: allergic     O: VS:   Visit Vitals  BP 129/89 (BP 1 Location: Left arm, BP Patient Position: Sitting)   Pulse 63   Temp 97.2 ??F (36.2 ??C) (Oral)   Resp 16   Ht 5\' 7"  (1.702 m)   Wt 174 lb 4.8 oz (79.1 kg)   SpO2 100%   BMI 27.30 kg/m??       Data Reviewed:  ?? A1C:      Lab Results   Component Value Date/Time    Hemoglobin A1c 5.2 01/10/2017 11:31 AM     ?? Lipids:  Lab Results   Component Value Date/Time    Cholesterol, total 171 01/10/2017 11:31 AM    HDL Cholesterol 58 01/10/2017 11:31 AM    LDL, calculated 100 (H) 01/10/2017 11:31 AM    VLDL, calculated 13 01/10/2017 11:31 AM    Triglyceride 63 01/10/2017 11:31 AM  GENERAL: Michelle BanksKareen Saffo is in no acute distress. Non-toxic. Well nourished. Well developed. Appropriately groomed.   HEAD:  Normocephalic.  Atraumatic.  Non tender sinuses x 4.  EYE: PERRLA. EOMs intact. Sclera anicteric without injection. No drainage or discharge.   EARS: Hearing intact bilaterally. External ear canals normal without evidence of blood or swelling. Bilateral TM's intact, pearly grey with landmarks visible. Right TM with tympanosclerosis.  NOSE: Patent. Nasal turbinates pink.  No erythema. No discharge.    MOUTH: mucous membranes pink and moist. Posterior pharynx normal with cobblestone appearance.  No erythema, white exudate or obstruction.  NECK: supple.  Midline trachea.   No thyromegaly noted.  No cervical adenopathy noted.   RESP: Breath sounds are symmetrical bilaterally. Unlabored without SOB. Speaking in full sentences. Clear to auscultation bilaterally anteriorly and posteriorly.  No wheezes.  No rales or rhonchi.    CV: normal rate.  Regular rhythm. S1, S2 audible.  No murmur noted.  No rubs, clicks or gallops noted.  ABDOMEN: Flat without bulges or pulsations. Soft and nondistended. No masses or organomegaly.  No rebound, rigidity or guarding.  Bowel sounds normal x 4 quadrants.  No tenderness on palpation  BACK: No visible deformities or curvature. Full ROM.  No pain on palpation of the spinous processes in the cervical, thoracic, lumbar, sacral regions.  No CVA tenderness.    GU: Ext genitalia intact without inflammation, ulceration, lesions or discharge. No cysts. No tenderness. Vagina is pink, moist with ruggae. No areas of erythema or ulceration.   Vaginal discharge is not noted. Cervix pink and moist with no  bleeding or discharge.  Firm, mobile and non-tender with palpation.  Adnexa normal in size without masses or tenderness. Pap Smear  And NuSwab are completed today. Sslice LPN present in room.   NEURO:  awake, alert and oriented to person, place, and time and event.  Clear speech.  Muscle strength is +5/5 x 4 extremities.  Sensation is intact to light touch bilaterally.  Steady gait.    MUSC:  Intact x 4 extremities.  Full ROM x 4 extremities.  No pain with movement. Patellar reflexes 2+  HEME/LYMPH: peripheral pulses palpable 2+ x 4 extremities.  No peripheral edema is noted.    SKIN: Skin is warm and dry. Turgor is normal. No petechiae, no purpura, no rash. No cyanosis. No mottling, jaundice or pallor.   PSYCH: appropriate behavior, dress and thought processes.  Good eye contact.  Clear and coherent speech. Full affect. Good insight.   ______________________________________________________________________  Patient education was done.  Advised on nutrition, physical activity, weight management, tobacco, alcohol and safety.  Counseling included discussion of diagnosis, differentials, treatment options, prescribed treatment, warning signs and follow up. Medication risks/benefits,interactions and alternatives discussed with patient.   ??  Patient verbalized understanding and agreed to plan of care.  Patient was given an after visit summary which included current diagnoses, medications and vital signs.    Follow up as directed.

## 2018-03-22 NOTE — Progress Notes (Signed)
Room: 11    Identified pt with two pt identifiers(name and DOB). Reviewed record in preparation for visit and have obtained necessary documentation. All patient medications has been reviewed.    Chief Complaint   Patient presents with   ??? Complete Physical       Health Maintenance Due   Topic   ??? PAP AKA CERVICAL CYTOLOGY    ??? Influenza Age 48 to Adult        Vitals:    03/22/18 0847   BP: 129/89   Pulse: 63   Resp: 16   Temp: 97.2 ??F (36.2 ??C)   TempSrc: Oral   SpO2: 100%   Weight: 174 lb 4.8 oz (79.1 kg)   Height: 5' 7" (1.702 m)   PainSc:   0 - No pain       1. Have you been to the ER, urgent care clinic since your last visit?  Hospitalized since your last visit?No    2. Have you seen or consulted any other health care providers outside of the [facility] since your last visit?  Include any pap smears or colon screening. No    3. Would you like to receive your flu shot today?NO    4. Are you fasting for blood work today?YES    3) Do you have an Advanced Directive/ Living Will in place? NO  If yes, do we have a copy on file NO  If no, would you like information YES    Patient is accompanied by self I have received verbal consent from Michelle Benitez to discuss any/all medical information while they are present in the room.

## 2018-03-22 NOTE — Patient Instructions (Addendum)
1) Healthy Weight  Body Mass Index is a noninvasive way to screen for weight and body fat.  This is a mathematical calculation based on your height and weight. A healthy BMI is between 20% -24.9%.   Your BMI is 27 %.  There is a relationship between high BMI and various healthy problems, such as osteoarthritis, muscle pain, increased risk of cancer, diabetes, heart disease, stroke, hypertension, high cholesterol, sleep apnea, breathing problems, depression, which is why a healthy BMI is so important.    In terms of weight loss, a 5-10% reduction in body weight over 3-6 months is a reasonable goal.  There would likely be improvements in risk for disease such as diabetes and heart disease, with this amount of weight loss.     In order to lose weight, try reducing your daily intake of calories by decreasing portion size of food and increase exercise to help reduce weight. Eat 3-5 small meals per day instead of 3 large meals.    A good tip to losing weight : DON'T EAT OR DRINK ANYTHING AFTER DINNER!      Choose lean meats for protein source which include chicken, pork, and [place]. The recommended serving size for protein is a 2-3 oz serving (the size of your fist), and 1-1.5 oz of carbohydrate per meal (about 1 cup).  Increase servings of fruits and vegetables.  Limit processed carbohydrates, (i.e. most breads, crackers, pasta, chips, rice, breaded or battered food, etc). If you choose to eat carbohydrates, whole wheat, (instead of white) is a healthier option for bread, rice, and crackers. Avoid fried foods.  Limit sugar.  Do your best to avoid all sweetened beverages, such as alcohol, juice, sodas or sweet teas, drink water, unsweet tea, diet soda, or crystal light as options instead.  (Don't drink more than 2 of the 12oz artificially sweetened drinks per day as these can increase hunger and make it more difficult to lose weight.  The daily goal for water intake should be at least 64 oz/day for most people.      Daily exercise will also help with weight loss and overall health.  A minimum of 150 minutes a week of moderate exercise is recommended (30 minutes per day).  Make exercise a routine part of your day - for example, park in spaces far away from builidngs, take stairs instead of elevator, if sitting for long periods, get up from chair and walk every hour.    Recruit a friend or relative to exercise with you and keep you on schedule.  It is much easier to exercise with a buddy who will make sure you work out each day!    Home DVDs can be a great way to work out, if you will do them (otherwise, they aren't worth the money!) RushFit is a eight week mixed martial arts (MMA) based workout.  It has 5 main workout DVDs, each one is 45 minutes consisting of a 10 minute warm-up, five 5 minute workouts and a 6 minute stretching/cool down.  It is easy to follow, with options to modify each move and you only need hand weights and some space to do the work out.     Meal planning smart phone applications like Spoonacular can help plan healthy meals.    Get 7-8 hours of sleep each night.     For reliable dietary information, go to www.http://www.hurst.com/.    You may wish to consider seeing the nutritionist at [facility] [phone]), [facility] (  [phone]) or [facility] [phone]).   Alternatively, you can dial [employer] [facility] at [phone] (Monday - Friday 9am - 5pm) for a complimentary (free) conversation about your diet. Meal plans, grocery list and tips for healthy eating can be sent to you upon request.     Free smart phone application to help manage weight loss:  MyFitnessPal = tracks food intake, exercise and weight.  Daily nutritional summary.  Meal Logger     2) You were seen in the office and a pap smear was performed. A Pap smear is a screening test which helps detect cervical cancer and human papilloma virus (HPV) which can cause cancer and diseases.  The [place] (USPSTF) recommends screening in women ages 84-38 years old for cervical cancer with cytology only.  If the results are normal, then the USPSTF recommends retesting in 3 years until 47 years of age.  In women ages 58-77 years old, cytology and human papillomavirus (HPV) testing is recommended.  If the results are normal, then the USPSTF recommends retesting in 5 years.    It is possible that you may have some spotting after your pap today- this is normal and should resolve within a day or two. We will either send a letter with the results or we will call to discuss results. Results generally take a couple of weeks.     Follow up if you experience any of the following: new or changed discharge or odor; vaginal itching or pain; pain with sex; abnormal bleeding or changes in your cycle; new breast lump or mass; rash which appears on your breast.       3) Blood pressure looks ok - 129/89  Please check your blood pressure through the week at staggered times in the day. (If you check in the morning, it should be at least one hour after your morning blood pressure medications.)      Arm monitors are most accurate.  If you use a wrist monitor, make sure your wrist is at heart level. You can bring your home monitor to your next visit and have it calibrated with the machine in the office to gauge your readings. Sit  with your feet uncrossed and relax for 5 minutes before taking your BP.    Keep a written record of your blood pressure readings and bring it to each appointment.  If your systolic (top) blood pressure is consistently greater than or less than of the diastolic (bottom) number is consistently greater than or less than then please schedule an office appointment.      Work on healthy eating - no salt diet, more potassium (helps flush out sodium,making healthier heart and arteries) - and incorporating daily exercise into your routine.     Cardiac symptoms that would need immediate attention include: uncomfortable pressure, squeezing, fullness or pain in the center of your chest. Pain or discomfort in one or both arms, the back, neck, jaw or stomach. Shortness of breath with or without chest discomfort, breaking out in a cold sweat, nausea or lightheadedness.    4) Please make an appointment to see gastrointestinal specialty.  I have put in a referral for Dr. Sandie Ano, however, you can schedule with one of his partners or another preferred GI provider if you wish.    Dr. Mort Sawyers  Gastrointestinal Specialist  [address]  [place], [place] [address]  Phone: [phone]    5) Trichomoniasis  Trichomoniasis (or "trich") is a  common sexually transmitted disease. It is caused by infection with a protozoan parasite called Trichomonas vaginalis. The parasite passes from an infected person to an uninfected person during sex. In women, the most commonly infected part of the body is the lower genital tract (vulva, vagina, cervix, or urethra). In men, the most commonly infected body part is the inside of the penis (urethra). During sex, the parasite usually spreads from a penis to a vagina, or from a vagina to a penis. It can also spread from a vagina to another vagina. It is not common for the parasite to infect other body parts, like the hands, mouth, or anus.  Although symptoms of the disease vary, most people who have the parasite cannot tell they are infected.    Chlamydia  This is a common STD that infects both men and women.  Chlamydia is spread through anal, vaginal and oral sex with someone who has chlamydia. If you test positive for chlamydia, you and your partner(s) need to be treated as, if left untreated, it can cause permanent damage to a woman's reproductive system.     Gonorrhea  This is a common STD that infects both men and women.  Gonorrhea can infect the genitals, rectum and throat.  It is spread through anal,  vaginal and oral sex with someone who has gonorrhea. If you test positive for gonorrhea, you and your partner(s) need to be treated as, if left untreated, it can cause permanent damage.       Syphilis  Syphilis is a sexually transmitted infection that you can get by direct contact with a syphilis sore during vaginal, anal, or oral sex. You can find sores on or around the penis, vagina, or anus, or in the rectum, on the lips, or in the mouth. Syphilis can spread from an infected mother to her unborn baby.  Syphilis can cause serious health problems if it is not treated.    Human Immunodeficiency Virus (HIV)   HIV is a virus spread through certain body fluids (blood semen, pre-seminal fluid, rectal fluid, vaginal fluid and breast milk) that attacks the immune system and can lead to Acquired Immunodeficiency Syndrome (AIDS). Risky behaviors, such as anal or vaginal intercourse without using a condom or sharing needles or syringes, increase the chance of HIV transmission.  The test for HIV may not reflect exposures in the last 3-6 months. If you have had new partners or exposures in that time, an additional test for HIV is recommended in 6 months.    The only way to prevent STD infection is to abstain from oral, anal and vaginal sex. Using condoms correctly and/or being in a mutually monogamous relationship with a STD-free partner lowers your chances of getting STDs.     The NuSwab is a test used to screen for yeast and bacterial vaginosis, which are not considered sexually transmitted diseases.    Yeast  Yeast is considered part of the normal vaginal flora, but overgrowth can result in vulvovaginitis. Candida albicans accounts for 80 to 92 percent of episodes of vulvovaginal candidiasis; Candida glabrata is the next most common species.      Bacterial Vaginosis   Bacterial vaginosis (BV) is an imbalance of bacteria normally found in the vagina. Although BV can be spread by sexual contact, it is NOT a sexually  transmitted disease. BV is not usually treated unless you are pregnant or having symptoms such as gray, white discharge, pain or burning, strong fish-like odor or itching.  Learning About the Mediterranean Diet  What is the Mediterranean diet?    The Mediterranean diet is a style of eating rather than a diet plan. It features foods eaten in [place], [place], southern [place] and [place], and other countries along the [employer]. It emphasizes eating foods like fish, fruits, vegetables, beans, high-fiber breads and whole grains, nuts, and olive oil. This style of eating includes limited red meat, cheese, and sweets.  Why choose the Mediterranean diet?  A Mediterranean-style diet may improve heart health. It contains more fat than other heart-healthy diets. But the fats are mainly from nuts, unsaturated oils (such as fish oils and olive oil), and certain nut or seed oils (such as canola, soybean, or flaxseed oil). These fats may help protect the heart and blood vessels.  How can you get started on the Mediterranean diet?  Here are some things you can do to switch to a more Mediterranean way of eating.  What to eat  ?? Eat a variety of fruits and vegetables each day, such as grapes, blueberries, tomatoes, broccoli, peppers, figs, olives, spinach, eggplant, beans, lentils, and chickpeas.  ?? Eat a variety of whole-grain foods each day, such as oats, brown rice, and whole wheat bread, pasta, and couscous.  ?? Eat fish at least 2 times a week. Try tuna, salmon, mackerel, lake trout, herring, or sardines.  ?? Eat moderate amounts of low-fat dairy products, such as milk, cheese, or yogurt.  ?? Eat moderate amounts of poultry and eggs.  ?? Choose healthy (unsaturated) fats, such as nuts, olive oil, and certain nut or seed oils like canola, soybean, and flaxseed.  ?? Limit unhealthy (saturated) fats, such as butter, palm oil, and coconut oil. And limit fats found in animal products, such as meat and dairy  products made with whole milk. Try to eat red meat only a few times a month in very small amounts.  ?? Limit sweets and desserts to only a few times a week. This includes sugar-sweetened drinks like soda.  The Mediterranean diet may also include red wine with your meal???1 glass each day for women and up to 2 glasses a day for men.  Tips for eating at home  ?? Use herbs, spices, garlic, lemon zest, and citrus juice instead of salt to add flavor to foods.  ?? Add avocado slices to your sandwich instead of bacon.  ?? Have fish for lunch or dinner instead of red meat. Brush the fish with olive oil, and broil or grill it.  ?? Sprinkle your salad with seeds or nuts instead of cheese.  ?? Cook with olive or canola oil instead of butter or oils that are high in saturated fat.  ?? Switch from 2% milk or whole milk to 1% or fat-free milk.  ?? Dip raw vegetables in a vinaigrette dressing or hummus instead of dips made from mayonnaise or sour cream.  ?? Have a piece of fruit for dessert instead of a piece of cake. Try baked apples, or have some dried fruit.  Tips for eating out  ?? Try broiled, grilled, baked, or poached fish instead of having it fried or breaded.  ?? Ask your server to have your meals prepared with olive oil instead of butter.  ?? Order dishes made with marinara sauce or sauces made from olive oil. Avoid sauces made from cream or mayonnaise.  ?? Choose whole-grain breads, whole wheat pasta and pizza crust, brown rice, beans, and lentils.  ?? Cut back on butter or margarine  on bread. Instead, you can dip your bread in a small amount of olive oil.  ?? Ask for a side salad or grilled vegetables instead of french fries or chips.  Where can you learn more?  Go to InsuranceStats.cahttp://www.healthwise.net/GoodHelpConnections.  Enter O407 in the search box to learn more about "Learning About the Mediterranean Diet."  Current as of: January 25, 2017  Content Version: 12.2  ?? 2006-2019 [employer]. Care instructions adapted under  license by [employer] (which disclaims liability or warranty for this information). If you have questions about a medical condition or this instruction, always ask your healthcare professional. [employer] Inc[employer] disclaims any warranty or liability for your use of this information.         Urge Incontinence in Women: Care Instructions  Your Care Instructions    Urge incontinence occurs when the need to urinate is so strong that you cannot reach the toilet in time, even when your bladder contains only a small amount of urine. This is also called overactive bladder or unstable bladder. Some women may have no warning before they leak urine. This condition does not cause major health problems. But it can be embarrassing and can affect a woman's self-esteem and confidence.  Treatment can cure or improve your symptoms.  Follow-up care is a key part of your treatment and safety. Be sure to make and go to all appointments, and call your doctor if you are having problems. It's also a good idea to know your test results and keep a list of the medicines you take.  How can you care for yourself at home?  ?? Be safe with medicines. Take your medicines exactly as prescribed. Call your doctor if you think you are having a problem with your medicine. You will get more details on the specific medicines your doctor prescribes.  ?? Limit caffeine and alcohol. They stimulate urine production.  ?? Urinate every 2 to 4 hours during waking hours, even if you feel that you do not have to go.  ?? Do pelvic floor (Kegel) exercises, which tighten and strengthen pelvic muscles. To do Kegel exercises:  ? Squeeze the same muscles you would use to stop your urine. Your belly and thighs should not move.  ? Hold the squeeze for 3 seconds, then relax for 3 seconds.  ? Start with 3 seconds. Then add 1 second each week until you are able to squeeze for 10 seconds.  ? Repeat the exercise 10 to 15 times for each session. Do three or more  sessions each day.  ?? Try wearing pads that absorb the leaks.  ?? Keep skin in the genital area dry.  When should you call for help?  Call your doctor now or seek immediate medical care if:  ?? ?? You have new urinary symptoms. These may include leaking urine, having pain when urinating, or feeling like you need to urinate often.   ??Watch closely for changes in your health, and be sure to contact your doctor if:  ?? ?? You do not get better as expected.   Where can you learn more?  Go to InsuranceStats.cahttp://www.healthwise.net/GoodHelpConnections.  Enter [phone] in the search box to learn more about "Urge Incontinence in Women: Care Instructions."  Current as of: May 09, 2017  Content Version: 12.2  ?? 2006-2019 [employer]. Care instructions adapted under license by [employer] (which disclaims liability or warranty for this information). If you have questions about a medical condition or this instruction, always ask  your healthcare professional. [employer] disclaims any warranty or liability for your use of this information.

## 2018-03-22 NOTE — Progress Notes (Signed)
S: Michelle Benitez is a 48 y.o. BLACK OR [place] female who presents for CPE    Assessment/Plan:  1. Well woman exam with routine gynecological exam  -discussed healthy diet and increasing daily exercise  -labs today: CBC, CMP, urinalysis, A1c, lipid,   -HM: Pap and NuSwab today, allergic to flu vaccine, STD testing    2. Essential hypertension with goal blood pressure less than 140/90  -current therapy: amlodipine 5mg  + indapamide 1.25mg    BP today - 129/89    3. Blood in stool  -REFERRAL TO GASTROENTEROLOGY    4. Urinary frequency  -current therapy: solifenacin 5mg  - not working, requesting brand, VESICARE which worked previously    5. Depression/Anxiety  -PHQ = 1, GAD = 3    RTC 1 year for CPE, pending lab results     HPI:    HTN  Current therapy: amlodipine 5mg  + indapamide 1.25mg    BP today - 129/89    Blood in stool -   has been assessed in past, no colonoscopy done at that time  Was told they weren't hemorrhoids, but given an ointment and sitz bath tx    OAB  Medication not working well - not vesicare, generic version. Pt requesting brand med     PHQ-9 Score: 1  Over the past 2 weeks, how often have you been bothered by any of the following problems? (Not at all = 0; several days = 1; More than 1/2 the days = 2; nearly every day = 3)  1) Little interest or pleasure in doing things:  0  2) Feeling down, depressed or hopeless:0  3) Trouble falling asleep, staying asleep or sleeping too much:0  4) Feeling tired or having little energy:0  5) Poor appetite or overeating:1  6) Feeling bad about yourself - or that you're a failure or have let yourself or your family down:0  7) Trouble concentrating on things, such as reading the newspaper or watching TV: 0  8) Moving or speaking so slowly that other people could have noticed. Or, the opposite - being so fidgety or restless that you have been moving around a lot more than usual:0  9) Thoughts that you would be better off dead or of hurting yourself in some  way:0    GAD7 score: 3  Feeling nervous, anxious or on edge:   0  Not being able to stop or control worrying:1  Worrying too much about different things:1  Trouble relaxing:  0  Being so restless that it is hard to sit still:  0  Becoming easily annoyed or irritable:  0  Feeling afraid as if something awful might happen: 1  If any of the above were scored more than 0, how difficult have these problems made it for you to do your work, take care of things at home, or get along with other people? Not at all     Social history:   Nutrition: overall healthy   Drinking 3 bottles of water   Physical:  Going to gym   Social: has 2 kids   Occupation: Runner, broadcasting/film/video    Social History     Tobacco Use   Smoking Status Never Smoker   Smokeless Tobacco Never Used     Social History     Substance and Sexual Activity   Alcohol Use Yes   ??? Alcohol/week: 0.0 standard drinks    Comment: glasses of wine occasional, less than once a month     Social History  Substance and Sexual Activity   Drug Use No     Social History     Substance and Sexual Activity   Sexual Activity Yes   ??? Partners: Male   ??? Birth control/protection: None, Surgical    Comment: monogamous with partner since about 2014       No LMP recorded. Patient has had an ablation.    Review of Systems:  - Constitutional Symptoms: no fevers/chills  - Eyes: no blurry vision or double vision  - Cardiovascular: no chest pain or palpitations  - Respiratory: no cough or shortness of breath  - Gastrointestinal: no dysphagia or abdominal pain  - Musculoskeletal: no joint pains or weakness  - Integumentary: no rashes  - GU: no vaginal discharge, itching, dyspareunia.  - Neurological: no numbness, tingling, or headaches  - Endocrine:  no heat or cold intolerance, no polyuria or polydipsia  ROS is negative otherwise.    I reviewed the following:  Past Medical History:   Diagnosis Date   ??? Anemia     Last CBC: h/h 10.1/29.8 (10/2008)   ??? Genital herpes    ??? History of loop electrical excision  procedure (LEEP)    ??? 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 Repeat Pap in 1 year   ??? Hypertension    ??? Migraine     None in years, 3y, since have a piercing in upper ear after someone suggested this could help   ??? Vitamin D deficiency 10/14/2013       Current Outpatient Medications   Medication Sig Dispense Refill   ??? solifenacin (VESICARE) 5 mg tablet Take 1 Tab by mouth daily. 90 Tab 1   ??? amLODIPine (NORVASC) 5 mg tablet TAKE 1 TABLET BY MOUTH EVERY DAY  Indications: high blood pressure 90 Tab 0   ??? indapamide (LOZOL) 1.25 mg tablet TAKE 1 TABLET BY MOUTH DAILY  Indications: high blood pressure 90 Tab 0   ??? valACYclovir (VALTREX) 1 gram tablet TAKE 1 TABLET BY MOUTH DAILY 90 Tab 2       Allergies   Allergen Reactions   ??? Influenza Virus Vaccine, Specific Swelling     Happened 2 years in a row.   ??? Lexapro [Escitalopram] Other (comments)     insomnia   ??? Lisinopril Angioedema     Lip swelling on 07/10/13 while taking lisinopril-HCTZ        Health Maintenance:  PAP: needs one  Mammogram: 01/2017  Colonoscopy: no fam hx   Tdap: 2017  Flu: allergic     O: VS:   Visit Vitals  BP 129/89 (BP 1 Location: Left arm, BP Patient Position: Sitting)   Pulse 63   Temp 97.2 ??F (36.2 ??C) (Oral)   Resp 16   Ht 5\' 7"  (1.702 m)   Wt 174 lb 4.8 oz (79.1 kg)   SpO2 100%   BMI 27.30 kg/m??       Data Reviewed:  ?? A1C:      Lab Results   Component Value Date/Time    Hemoglobin A1c 5.2 01/10/2017 11:31 AM     ?? Lipids:  Lab Results   Component Value Date/Time    Cholesterol, total 171 01/10/2017 11:31 AM    HDL Cholesterol 58 01/10/2017 11:31 AM    LDL, calculated 100 (H) 01/10/2017 11:31 AM    VLDL, calculated 13 01/10/2017 11:31 AM    Triglyceride 63 01/10/2017 11:31 AM  GENERAL: Janese BanksKareen Hartland is in no acute distress. Non-toxic. Well nourished. Well developed. Appropriately groomed.  HEAD:  Normocephalic.  Atraumatic.  Non tender sinuses x 4.  EYE: PERRLA. EOMs intact. Sclera  anicteric without injection. No drainage or discharge.   EARS: Hearing intact bilaterally. External ear canals normal without evidence of blood or swelling. Bilateral TM's intact, pearly grey with landmarks visible. Right TM with tympanosclerosis.  NOSE: Patent. Nasal turbinates pink.  No erythema. No discharge.    MOUTH: mucous membranes pink and moist. Posterior pharynx normal with cobblestone appearance.  No erythema, white exudate or obstruction.  NECK: supple.  Midline trachea.   No thyromegaly noted.  No cervical adenopathy noted.   RESP: Breath sounds are symmetrical bilaterally. Unlabored without SOB. Speaking in full sentences. Clear to auscultation bilaterally anteriorly and posteriorly.  No wheezes.  No rales or rhonchi.    CV: normal rate.  Regular rhythm. S1, S2 audible.  No murmur noted.  No rubs, clicks or gallops noted.  ABDOMEN: Flat without bulges or pulsations. Soft and nondistended. No masses or organomegaly.  No rebound, rigidity or guarding.  Bowel sounds normal x 4 quadrants.  No tenderness on palpation  BACK: No visible deformities or curvature. Full ROM.  No pain on palpation of the spinous processes in the cervical, thoracic, lumbar, sacral regions.  No CVA tenderness.    GU: Ext genitalia intact without inflammation, ulceration, lesions or discharge. No cysts. No tenderness. Vagina is pink, moist with ruggae. No areas of erythema or ulceration.   Vaginal discharge is not noted. Cervix pink and moist with no  bleeding or discharge.  Firm, mobile and non-tender with palpation.  Adnexa normal in size without masses or tenderness. Pap Smear  And NuSwab are completed today. Sslice LPN present in room.   NEURO:  awake, alert and oriented to person, place, and time and event.  Clear speech.  Muscle strength is +5/5 x 4 extremities.  Sensation is intact to light touch bilaterally.  Steady gait.   MUSC:  Intact x 4 extremities.  Full ROM x 4 extremities.  No pain with movement. Patellar reflexes  2+  HEME/LYMPH: peripheral pulses palpable 2+ x 4 extremities.  No peripheral edema is noted.    SKIN: Skin is warm and dry. Turgor is normal. No petechiae, no purpura, no rash. No cyanosis. No mottling, jaundice or pallor.   PSYCH: appropriate behavior, dress and thought processes.  Good eye contact.  Clear and coherent speech. Full affect. Good insight.   ______________________________________________________________________  Patient education was done.  Advised on nutrition, physical activity, weight management, tobacco, alcohol and safety.  Counseling included discussion of diagnosis, differentials, treatment options, prescribed treatment, warning signs and follow up. Medication risks/benefits,interactions and alternatives discussed with patient.   ??  Patient verbalized understanding and agreed to plan of care.  Patient was given an after visit summary which included current diagnoses, medications and vital signs.    Follow up as directed.

## 2018-03-22 NOTE — Progress Notes (Signed)
 Room: 69    Identified pt with two pt identifiers(name and DOB). Reviewed record in preparation for visit and have obtained necessary documentation. All patient medications has been reviewed.    Chief Complaint   Patient presents with   . Complete Physical       Health Maintenance Due   Topic   . PAP AKA CERVICAL CYTOLOGY    . Influenza Age 48 to Adult        Vitals:    03/22/18 0847   BP: 129/89   Pulse: 63   Resp: 16   Temp: 97.2 F (36.2 C)   TempSrc: Oral   SpO2: 100%   Weight: 174 lb 4.8 oz (79.1 kg)   Height: 5' 7 (1.702 m)   PainSc:   0 - No pain       1. Have you been to the ER, urgent care clinic since your last visit?  Hospitalized since your last visit?No    2. Have you seen or consulted any other health care providers outside of the [facility] since your last visit?  Include any pap smears or colon screening. No    3. Would you like to receive your flu shot today?NO    4. Are you fasting for blood work today?YES    3) Do you have an Advanced Directive/ Living Will in place? NO  If yes, do we have a copy on file NO  If no, would you like information YES    Patient is accompanied by self I have received verbal consent from Michelle Benitez to discuss any/all medical information while they are present in the room.

## 2018-03-23 DIAGNOSIS — G4733 Obstructive sleep apnea (adult) (pediatric): Secondary | ICD-10-CM | POA: Diagnosis not present

## 2018-03-24 LAB — NUSWAB VAGINITIS PLUS
C. albicans, NAA: NEGATIVE
C. glabrata, NAA: NEGATIVE
C. trachomatis, NAA: NEGATIVE
N. gonorrhoeae, NAA: NEGATIVE
T. vaginalis, NAA: NEGATIVE

## 2018-03-24 LAB — NUSWAB VAGINITIS PLUS (VG+)
Candida albicans, NAA: NEGATIVE
Candida glabrata by PCR: NEGATIVE
Chlamydia trachomatis, NAA: NEGATIVE
Neisseria Gonorrhoeae, NAA: NEGATIVE
Trichomonas Vaginalis by NAA: NEGATIVE

## 2018-03-26 ENCOUNTER — Other Ambulatory Visit: Payer: Self-pay | Admitting: *Deleted

## 2018-03-26 DIAGNOSIS — R0683 Snoring: Secondary | ICD-10-CM

## 2018-03-27 DIAGNOSIS — G4733 Obstructive sleep apnea (adult) (pediatric): Secondary | ICD-10-CM | POA: Diagnosis not present

## 2018-03-27 NOTE — Progress Notes (Signed)
Unable to contact pt regarding lab results, VM box is full!  Ok to mail result letter per Kelley Shannahan NP.

## 2018-03-27 NOTE — Progress Notes (Signed)
Unable to contact pt regarding lab results, VM box is full!  Ok to mail result letter per Johny Chess NP.

## 2018-03-28 ENCOUNTER — Telehealth: Payer: Self-pay | Admitting: Pulmonary Disease

## 2018-03-28 NOTE — Telephone Encounter (Signed)
HST 03/23/18 >> AHI 5.9, SpO2 low 93%

## 2018-03-29 MED ORDER — OXYBUTYNIN CHLORIDE 5 MG TAB
5 mg | ORAL_TABLET | Freq: Three times a day (TID) | ORAL | 1 refills | Status: DC
Start: 2018-03-29 — End: 2018-05-11

## 2018-03-29 NOTE — Telephone Encounter (Signed)
Attempted to call patient today regarding results. I did not receive an answer at time of call. I have left a voicemail message for pt to return call. X1  

## 2018-03-30 NOTE — Telephone Encounter (Signed)
Attempted to call patient today regarding results. I did not receive an answer at time of call. I have left a voicemail message for pt to return call. X2  

## 2018-04-02 ENCOUNTER — Telehealth: Payer: Self-pay | Admitting: Pulmonary Disease

## 2018-04-02 NOTE — Telephone Encounter (Signed)
Pt is requesting home sleep study results.  VS please advise. Thanks

## 2018-04-02 NOTE — Telephone Encounter (Signed)
Attempted to call patient today regarding results. I have left a voicemail message for pt to return call. X3 We have left three messages for pt to call our office back regarding results. No response at this time, a letter has been placed in mail today for pt to call our office. Mailed letter of communication today. Nothing further needed at this time.    

## 2018-04-10 NOTE — Telephone Encounter (Signed)
Please see phone note from 03/28/18.

## 2018-04-10 NOTE — Telephone Encounter (Signed)
ATC pt, no answer. Left message for pt to call back.  

## 2018-04-11 NOTE — Telephone Encounter (Signed)
LMTCB for pt 

## 2018-04-12 NOTE — Telephone Encounter (Signed)
LMTCB x3 for pt.  

## 2018-04-13 NOTE — Telephone Encounter (Signed)
We have attempted to contact pt several times with no success or call back from pt. Per triage protocol, message will be closed.  

## 2018-04-16 ENCOUNTER — Telehealth: Payer: Self-pay | Admitting: Pulmonary Disease

## 2018-04-16 NOTE — Telephone Encounter (Signed)
Coralyn Helling, MD    HST 03/23/18 >> AHI 5.9, SpO2 low 93%    --------------------------------- Spoke with pt. She is aware of results. Nothing further was needed.

## 2018-04-27 ENCOUNTER — Encounter

## 2018-04-30 ENCOUNTER — Encounter: Payer: Self-pay | Admitting: Nurse Practitioner

## 2018-04-30 ENCOUNTER — Ambulatory Visit (INDEPENDENT_AMBULATORY_CARE_PROVIDER_SITE_OTHER): Payer: BLUE CROSS/BLUE SHIELD | Admitting: Nurse Practitioner

## 2018-04-30 VITALS — BP 132/82 | HR 91 | Temp 98.3°F | Resp 16 | Ht 63.0 in | Wt 152.0 lb

## 2018-04-30 DIAGNOSIS — R079 Chest pain, unspecified: Secondary | ICD-10-CM | POA: Diagnosis not present

## 2018-04-30 DIAGNOSIS — E559 Vitamin D deficiency, unspecified: Secondary | ICD-10-CM

## 2018-04-30 DIAGNOSIS — I10 Essential (primary) hypertension: Secondary | ICD-10-CM | POA: Diagnosis not present

## 2018-04-30 DIAGNOSIS — Z13220 Encounter for screening for lipoid disorders: Secondary | ICD-10-CM | POA: Diagnosis not present

## 2018-04-30 DIAGNOSIS — Z0001 Encounter for general adult medical examination with abnormal findings: Secondary | ICD-10-CM | POA: Diagnosis not present

## 2018-04-30 NOTE — Assessment & Plan Note (Signed)
Stable Continue current medications Update annual labs - Comprehensive metabolic panel; Future - CBC; Future - Lipid panel; Future

## 2018-04-30 NOTE — Assessment & Plan Note (Signed)
Reviewed annual screening exams, healthy lifestyle,  additional information provided on AVS - Comprehensive metabolic panel; Future - CBC; Future - Lipid panel; Future - VITAMIN D 25 Hydroxy (Vit-D Deficiency, Fractures); Future  Screening for cholesterol level She is fasting - Lipid panel; Future

## 2018-04-30 NOTE — Progress Notes (Signed)
Ruth Schmidt is a 48 y.o. female with the following history as recorded in [employer]:  Patient Active Problem List   Diagnosis Date Noted  . Hyperpigmentation 09/26/2013  . Anemia, iron deficiency 01/04/2013  . Hypertension   . Routine gynecological examination 07/24/2012    Current Outpatient Medications  Medication Sig Dispense Refill  . amLODipine (NORVASC) 5 MG tablet Take 1 tablet (5 mg total) by mouth daily. 90 tablet 1  . fluticasone (FLONASE) 50 MCG/ACT nasal spray Place 2 sprays into both nostrils daily. 16 g 6   No current facility-administered medications for this visit.     Allergies: Patient has no known allergies.  Past Medical History:  Diagnosis Date  . Abnormal Pap smear of cervix   . Hypertension     Past Surgical History:  Procedure Laterality Date  . CESAREAN SECTION    . TUBAL LIGATION Bilateral 2005    Family History  Problem Relation Age of Onset  . Hypertension Mother   . Diabetes Father   . Hyperlipidemia Father   . Cancer Unknown   . Hypertension Maternal Grandmother   . Diabetes Paternal Grandmother   . Stroke Maternal Aunt 65  . Breast cancer Maternal Aunt 56  . Coronary artery disease Maternal Grandfather 61       MI, sudden death  . Lung cancer Maternal Uncle   . Colon cancer Maternal Uncle     Social History   Tobacco Use  . Smoking status: Never Smoker  . Smokeless tobacco: Never Used  Substance Use Topics  . Alcohol use: Yes    Comment: wine     Subjective:  Ruth Schmidt is here today for CPE. Will also recheck her vitamin D level, low on 10/2017 labs and she was sent prescription supplement, which she completed, but did not realize she was to start OTC supplement after that  CPE-  Last dental exam: plans to schedule this year, just got dental insurance Last vision exam: no routine eye exam PAP: up to date by GYN per pt, [facility] Mammogram- up to date by GYN Lipids: lipid panel ordered  DM screening- not  indicated Vaccinations: up to date Diet and exercise: plans to start gym 2x per week  Hypertension -maintained on amlodipine 5 daily Reports daily medication compliance.  BP Readings from Last 3 Encounters:  04/30/18 132/82  02/21/18 114/74  10/27/17 132/80   Review of Systems  Constitutional: Negative for chills and fever.  HENT: Negative for hearing loss.   Eyes: Negative for blurred vision and double vision.  Respiratory: Negative for cough and shortness of breath.   Cardiovascular: Positive for chest pain. Negative for palpitations, orthopnea and leg swelling.  Gastrointestinal: Negative for abdominal pain, blood in stool, constipation, diarrhea, nausea and vomiting.  Genitourinary: Negative for dysuria and hematuria.  Musculoskeletal: Negative for falls.  Neurological: Negative for dizziness, speech change and loss of consciousness.  Endo/Heme/Allergies: Does not bruise/bleed easily.  Psychiatric/Behavioral: Negative for depression and memory loss. The patient is not nervous/anxious.    Chest pain-this is a new problem, she has noticed intermittent "soreness/tightness" in the middle/right side of her chest for the past several weeks, no noted activity at onset, sometimes occurs while at rest, sometimes during exertion, then resolves after a few seconds, no personal cardiac history, family history of MI, HTN, HLD  Objective:  Vitals:   04/30/18 1553  BP: 132/82  Pulse: 91  Resp: 16  Temp: 98.3 F (36.8 C)  TempSrc: Oral  SpO2: 99%  Weight: 152 lb (68.9 kg)  Height: 5\' 3"  (1.6 m)    General: Well developed, well nourished, in no acute distress  Skin : Warm and dry.  Head: Normocephalic and atraumatic  Eyes: Sclera and conjunctiva clear; pupils round and reactive to light; extraocular movements intact  Ears: External normal; canals clear; tympanic membranes normal  Oropharynx: Pink, supple. No suspicious lesions  Neck: Supple without thyromegaly, adenopathy  Lungs:  Respirations unlabored; clear to auscultation bilaterally without wheeze, rales, rhonchi  CVS exam: normal rate, regular rhythm, normal S1, S2, no murmurs, rubs, clicks or gallops.  Abdomen: Soft; nontender; nondistended;  no masses or hepatosplenomegaly  Musculoskeletal: No deformities; no active joint inflammation  Extremities: No edema, cyanosis, clubbing  Vessels: Symmetric bilaterally  Neurologic: Alert and oriented; speech intact; face symmetrical; moves all extremities well; CNII-XII intact without focal deficit  Psychiatric: Normal mood and affect.  Assessment:  1. Encounter for general adult medical examination with abnormal findings   2. Essential hypertension   3. Screening for cholesterol level   4. Chest pain, unspecified type   5. Vitamin D deficiency     Plan:   Chest pain, unspecified type EKG sinus rhythm, no acute abnormalities Due to age, h/o HTN, family cardiac history, discussed referral to cardiology for further evaluation, she would like referral  TSH WNL 10/27/17 - Comprehensive metabolic panel; Future - CBC; Future - EKG 12-Lead - Ambulatory referral to Cardiology  Return in about 1 year (around 05/01/2019) for CPE.  Orders Placed This Encounter  Procedures  . Comprehensive metabolic panel    Standing Status:   Future    Standing Expiration Date:   05/01/2019  . CBC    Standing Status:   Future    Standing Expiration Date:   05/01/2019  . Lipid panel    Standing Status:   Future    Standing Expiration Date:   05/01/2019  . VITAMIN D 25 Hydroxy (Vit-D Deficiency, Fractures)    Standing Status:   Future    Standing Expiration Date:   04/30/2019  . Ambulatory referral to Cardiology    Referral Priority:   Routine    Referral Type:   Consultation    Referral Reason:   Specialty Services Required    Requested Specialty:   Cardiology    Number of Visits Requested:   1  . EKG 12-Lead    Requested Prescriptions    No prescriptions requested or ordered in  this encounter

## 2018-04-30 NOTE — Assessment & Plan Note (Signed)
Update labs F/U with further recommendations pending lab results - VITAMIN D 25 Hydroxy (Vit-D Deficiency, Fractures); Future 

## 2018-04-30 NOTE — Patient Instructions (Addendum)
Return for labs when fasting, nothing to eat for 6 hours prior, can have water/black coffee  I have placed a referral to cardiology  I will see you back in 1 year, sooner if needed.  Health Maintenance, Female Adopting a healthy lifestyle and getting preventive care can go a long way to promote health and wellness. Talk with your health care provider about what schedule of regular examinations is right for you. This is a good chance for you to check in with your provider about disease prevention and staying healthy. In between checkups, there are plenty of things you can do on your own. Experts have done a lot of research about which lifestyle changes and preventive measures are most likely to keep you healthy. Ask your health care provider for more information. Weight and diet Eat a healthy diet  Be sure to include plenty of vegetables, fruits, low-fat dairy products, and lean protein.  Do not eat a lot of foods high in solid fats, added sugars, or salt.  Get regular exercise. This is one of the most important things you can do for your health. ? Most adults should exercise for at least 150 minutes each week. The exercise should increase your heart rate and make you sweat (moderate-intensity exercise). ? Most adults should also do strengthening exercises at least twice a week. This is in addition to the moderate-intensity exercise. Maintain a healthy weight  Body mass index (BMI) is a measurement that can be used to identify possible weight problems. It estimates body fat based on height and weight. Your health care provider can help determine your BMI and help you achieve or maintain a healthy weight.  For females 45 years of age and older: ? A BMI below 18.5 is considered underweight. ? A BMI of 18.5 to 24.9 is normal. ? A BMI of 25 to 29.9 is considered overweight. ? A BMI of 30 and above is considered obese. Watch levels of cholesterol and blood lipids  You should start having your  blood tested for lipids and cholesterol at 47 years of age, then have this test every 5 years.  You may need to have your cholesterol levels checked more often if: ? Your lipid or cholesterol levels are high. ? You are older than 47 years of age. ? You are at high risk for heart disease. Cancer screening Lung Cancer  Lung cancer screening is recommended for adults 74-86 years old who are at high risk for lung cancer because of a history of smoking.  A yearly low-dose CT scan of the lungs is recommended for people who: ? Currently smoke. ? Have quit within the past 15 years. ? Have at least a 30-pack-year history of smoking. A pack year is smoking an average of one pack of cigarettes a day for 1 year.  Yearly screening should continue until it has been 15 years since you quit.  Yearly screening should stop if you develop a health problem that would prevent you from having lung cancer treatment. Breast Cancer  Practice breast self-awareness. This means understanding how your breasts normally appear and feel.  It also means doing regular breast self-exams. Let your health care provider know about any changes, no matter how small.  If you are in your 20s or 30s, you should have a clinical breast exam (CBE) by a health care provider every 1-3 years as part of a regular health exam.  If you are 49 or older, have a CBE every year. Also consider  having a breast X-ray (mammogram) every year.  If you have a family history of breast cancer, talk to your health care provider about genetic screening.  If you are at high risk for breast cancer, talk to your health care provider about having an MRI and a mammogram every year.  Breast cancer gene (BRCA) assessment is recommended for women who have family members with BRCA-related cancers. BRCA-related cancers include: ? Breast. ? Ovarian. ? Tubal. ? Peritoneal cancers.  Results of the assessment will determine the need for genetic counseling  and BRCA1 and BRCA2 testing. Cervical Cancer Your health care provider may recommend that you be screened regularly for cancer of the pelvic organs (ovaries, uterus, and vagina). This screening involves a pelvic examination, including checking for microscopic changes to the surface of your cervix (Pap test). You may be encouraged to have this screening done every 3 years, beginning at age 53.  For women ages 23-65, health care providers may recommend pelvic exams and Pap testing every 3 years, or they may recommend the Pap and pelvic exam, combined with testing for human papilloma virus (HPV), every 5 years. Some types of HPV increase your risk of cervical cancer. Testing for HPV may also be done on women of any age with unclear Pap test results.  Other health care providers may not recommend any screening for nonpregnant women who are considered low risk for pelvic cancer and who do not have symptoms. Ask your health care provider if a screening pelvic exam is right for you.  If you have had past treatment for cervical cancer or a condition that could lead to cancer, you need Pap tests and screening for cancer for at least 20 years after your treatment. If Pap tests have been discontinued, your risk factors (such as having a new sexual partner) need to be reassessed to determine if screening should resume. Some women have medical problems that increase the chance of getting cervical cancer. In these cases, your health care provider may recommend more frequent screening and Pap tests. Colorectal Cancer  This type of cancer can be detected and often prevented.  Routine colorectal cancer screening usually begins at 47 years of age and continues through 47 years of age.  Your health care provider may recommend screening at an earlier age if you have risk factors for colon cancer.  Your health care provider may also recommend using home test kits to check for hidden blood in the stool.  A small camera  at the end of a tube can be used to examine your colon directly (sigmoidoscopy or colonoscopy). This is done to check for the earliest forms of colorectal cancer.  Routine screening usually begins at age 69.  Direct examination of the colon should be repeated every 5-10 years through 47 years of age. However, you may need to be screened more often if early forms of precancerous polyps or small growths are found. Skin Cancer  Check your skin from head to toe regularly.  Tell your health care provider about any new moles or changes in moles, especially if there is a change in a mole's shape or color.  Also tell your health care provider if you have a mole that is larger than the size of a pencil eraser.  Always use sunscreen. Apply sunscreen liberally and repeatedly throughout the day.  Protect yourself by wearing long sleeves, pants, a wide-brimmed hat, and sunglasses whenever you are outside. Heart disease, diabetes, and high blood pressure  High blood pressure  causes heart disease and increases the risk of stroke. High blood pressure is more likely to develop in: ? People who have blood pressure in the high end of the normal range (130-139/85-89 mm Hg). ? People who are overweight or obese. ? People who are African American.  If you are 38-26 years of age, have your blood pressure checked every 3-5 years. If you are 11 years of age or older, have your blood pressure checked every year. You should have your blood pressure measured twice-once when you are at a hospital or clinic, and once when you are not at a hospital or clinic. Record the average of the two measurements. To check your blood pressure when you are not at a hospital or clinic, you can use: ? An automated blood pressure machine at a pharmacy. ? A home blood pressure monitor.  If you are between 95 years and 44 years old, ask your health care provider if you should take aspirin to prevent strokes.  Have regular diabetes  screenings. This involves taking a blood sample to check your fasting blood sugar level. ? If you are at a normal weight and have a low risk for diabetes, have this test once every three years after 47 years of age. ? If you are overweight and have a high risk for diabetes, consider being tested at a younger age or more often. Preventing infection Hepatitis B  If you have a higher risk for hepatitis B, you should be screened for this virus. You are considered at high risk for hepatitis B if: ? You were born in a country where hepatitis B is common. Ask your health care provider which countries are considered high risk. ? Your parents were born in a high-risk country, and you have not been immunized against hepatitis B (hepatitis B vaccine). ? You have HIV or AIDS. ? You use needles to inject street drugs. ? You live with someone who has hepatitis B. ? You have had sex with someone who has hepatitis B. ? You get hemodialysis treatment. ? You take certain medicines for conditions, including cancer, organ transplantation, and autoimmune conditions. Hepatitis C  Blood testing is recommended for: ? Everyone born from [birth year] through 1964. ? Anyone with known risk factors for hepatitis C. Sexually transmitted infections (STIs)  You should be screened for sexually transmitted infections (STIs) including gonorrhea and chlamydia if: ? You are sexually active and are younger than 48 years of age. ? You are older than 47 years of age and your health care provider tells you that you are at risk for this type of infection. ? Your sexual activity has changed since you were last screened and you are at an increased risk for chlamydia or gonorrhea. Ask your health care provider if you are at risk.  If you do not have HIV, but are at risk, it may be recommended that you take a prescription medicine daily to prevent HIV infection. This is called pre-exposure prophylaxis (PrEP). You are considered at risk  if: ? You are sexually active and do not regularly use condoms or know the HIV status of your partner(s). ? You take drugs by injection. ? You are sexually active with a partner who has HIV. Talk with your health care provider about whether you are at high risk of being infected with HIV. If you choose to begin PrEP, you should first be tested for HIV. You should then be tested every 3 months for as long as you are  taking PrEP. Pregnancy  If you are premenopausal and you may become pregnant, ask your health care provider about preconception counseling.  If you may become pregnant, take 400 to 800 micrograms (mcg) of folic acid every day.  If you want to prevent pregnancy, talk to your health care provider about birth control (contraception). Osteoporosis and menopause  Osteoporosis is a disease in which the bones lose minerals and strength with aging. This can result in serious bone fractures. Your risk for osteoporosis can be identified using a bone density scan.  If you are 35 years of age or older, or if you are at risk for osteoporosis and fractures, ask your health care provider if you should be screened.  Ask your health care provider whether you should take a calcium or vitamin D supplement to lower your risk for osteoporosis.  Menopause may have certain physical symptoms and risks.  Hormone replacement therapy may reduce some of these symptoms and risks. Talk to your health care provider about whether hormone replacement therapy is right for you. Follow these instructions at home:  Schedule regular health, dental, and eye exams.  Stay current with your immunizations.  Do not use any tobacco products including cigarettes, chewing tobacco, or electronic cigarettes.  If you are pregnant, do not drink alcohol.  If you are breastfeeding, limit how much and how often you drink alcohol.  Limit alcohol intake to no more than 1 drink per day for nonpregnant women. One drink equals  12 ounces of beer, 5 ounces of wine, or 1 ounces of hard liquor.  Do not use street drugs.  Do not share needles.  Ask your health care provider for help if you need support or information about quitting drugs.  Tell your health care provider if you often feel depressed.  Tell your health care provider if you have ever been abused or do not feel safe at home. This information is not intended to replace advice given to you by your health care provider. Make sure you discuss any questions you have with your health care provider. Document Released: 09/20/2010 Document Revised: 08/13/2015 Document Reviewed: 12/09/2014 Elsevier Interactive Patient Education  2019 [employer].

## 2018-05-11 ENCOUNTER — Encounter

## 2018-05-11 MED ORDER — OXYBUTYNIN CHLORIDE 5 MG TAB
5 mg | ORAL_TABLET | Freq: Three times a day (TID) | ORAL | 1 refills | Status: DC
Start: 2018-05-11 — End: 2018-10-25

## 2018-05-11 MED ORDER — VALACYCLOVIR 1 G TAB
1 gram | ORAL_TABLET | ORAL | 0 refills | Status: DC
Start: 2018-05-11 — End: 2018-11-12

## 2018-06-05 ENCOUNTER — Encounter: Payer: Self-pay | Admitting: Nurse Practitioner

## 2018-09-20 ENCOUNTER — Encounter: Payer: Self-pay | Admitting: Internal Medicine

## 2018-09-20 ENCOUNTER — Ambulatory Visit (INDEPENDENT_AMBULATORY_CARE_PROVIDER_SITE_OTHER): Payer: BC Managed Care – PPO | Admitting: Internal Medicine

## 2018-09-20 ENCOUNTER — Other Ambulatory Visit: Payer: Self-pay

## 2018-09-20 ENCOUNTER — Other Ambulatory Visit (INDEPENDENT_AMBULATORY_CARE_PROVIDER_SITE_OTHER): Payer: BC Managed Care – PPO

## 2018-09-20 ENCOUNTER — Telehealth: Payer: Self-pay

## 2018-09-20 ENCOUNTER — Other Ambulatory Visit: Payer: Self-pay | Admitting: Internal Medicine

## 2018-09-20 VITALS — BP 126/84 | HR 94 | Temp 98.2°F | Ht 63.0 in | Wt 153.0 lb

## 2018-09-20 DIAGNOSIS — L819 Disorder of pigmentation, unspecified: Secondary | ICD-10-CM

## 2018-09-20 DIAGNOSIS — E538 Deficiency of other specified B group vitamins: Secondary | ICD-10-CM | POA: Diagnosis not present

## 2018-09-20 DIAGNOSIS — D509 Iron deficiency anemia, unspecified: Secondary | ICD-10-CM

## 2018-09-20 DIAGNOSIS — E559 Vitamin D deficiency, unspecified: Secondary | ICD-10-CM

## 2018-09-20 DIAGNOSIS — I10 Essential (primary) hypertension: Secondary | ICD-10-CM

## 2018-09-20 LAB — CBC WITH DIFFERENTIAL/PLATELET
Basophils Absolute: 0 10*3/uL (ref 0.0–0.1)
Basophils Relative: 0.2 % (ref 0.0–3.0)
Eosinophils Absolute: 0 10*3/uL (ref 0.0–0.7)
Eosinophils Relative: 0.8 % (ref 0.0–5.0)
HCT: 37.4 % (ref 36.0–46.0)
Hemoglobin: 12.2 g/dL (ref 12.0–15.0)
Lymphocytes Relative: 34.4 % (ref 12.0–46.0)
Lymphs Abs: 1 10*3/uL (ref 0.7–4.0)
MCHC: 32.6 g/dL (ref 30.0–36.0)
MCV: 98.1 fl (ref 78.0–100.0)
Monocytes Absolute: 0.3 10*3/uL (ref 0.1–1.0)
Monocytes Relative: 11.1 % (ref 3.0–12.0)
Neutro Abs: 1.6 10*3/uL (ref 1.4–7.7)
Neutrophils Relative %: 53.5 % (ref 43.0–77.0)
Platelets: 238 10*3/uL (ref 150.0–400.0)
RBC: 3.81 Mil/uL — ABNORMAL LOW (ref 3.87–5.11)
RDW: 13.2 % (ref 11.5–15.5)
WBC: 2.9 10*3/uL — ABNORMAL LOW (ref 4.0–10.5)

## 2018-09-20 LAB — LIPID PANEL
Cholesterol: 190 mg/dL (ref 0–200)
HDL: 63.5 mg/dL (ref >39.00)
LDL Cholesterol: 110 mg/dL — ABNORMAL HIGH (ref 0–99)
NonHDL: 126.39
Total CHOL/HDL Ratio: 3
Triglycerides: 81 mg/dL (ref 0.0–149.0)
VLDL: 16.2 mg/dL (ref 0.0–40.0)

## 2018-09-20 LAB — IBC PANEL
Iron: 66 ug/dL (ref 42–145)
Saturation Ratios: 17.1 % — ABNORMAL LOW (ref 20.0–50.0)
Transferrin: 276 mg/dL (ref 212.0–360.0)

## 2018-09-20 LAB — HEPATIC FUNCTION PANEL
ALT: 19 U/L (ref 0–35)
AST: 16 U/L (ref 0–37)
Albumin: 4.2 g/dL (ref 3.5–5.2)
Alkaline Phosphatase: 43 U/L (ref 39–117)
Bilirubin, Direct: 0.1 mg/dL (ref 0.0–0.3)
Total Bilirubin: 0.4 mg/dL (ref 0.2–1.2)
Total Protein: 7.4 g/dL (ref 6.0–8.3)

## 2018-09-20 LAB — BASIC METABOLIC PANEL
BUN: 9 mg/dL (ref 6–23)
CO2: 26 mEq/L (ref 19–32)
Calcium: 9.4 mg/dL (ref 8.4–10.5)
Chloride: 104 mEq/L (ref 96–112)
Creatinine, Ser: 0.78 mg/dL (ref 0.40–1.20)
GFR: 95.64 mL/min (ref >60.00)
Glucose, Bld: 62 mg/dL — ABNORMAL LOW (ref 70–99)
Potassium: 3.5 mEq/L (ref 3.5–5.1)
Sodium: 139 mEq/L (ref 135–145)

## 2018-09-20 LAB — VITAMIN D 25 HYDROXY (VIT D DEFICIENCY, FRACTURES): VITD: 25.23 ng/mL — ABNORMAL LOW (ref 30.00–100.00)

## 2018-09-20 LAB — VITAMIN B12: Vitamin B-12: 896 pg/mL (ref 211–911)

## 2018-09-20 MED ORDER — AMLODIPINE BESYLATE 5 MG PO TABS: 5.0000 mg | ORAL_TABLET | Freq: Every day | ORAL | 1 refills | Status: DC

## 2018-09-20 MED ORDER — VITAMIN D (ERGOCALCIFEROL) 1.25 MG (50000 UNIT) PO CAPS: 50000.0000 [IU] | ORAL_CAPSULE | ORAL | 0 refills | Status: DC

## 2018-09-20 NOTE — Telephone Encounter (Signed)
-----   Message from Biagio Borg, MD sent at 09/20/2018  1:19 PM EDT ----- Left message on MyChart, pt to cont same tx except  The test results show that your current treatment is OK, as the tests are stable, except the Vitamin D level is low.  Please take Vitamin D 50000 units weekly for 12 weeks, then plan to change to OTC Vitamin D3 at 2000 units per day, indefinitely.  ,\Shirron to please inform pt, I will do rx

## 2018-09-20 NOTE — Telephone Encounter (Signed)
Pt has been informed of results and expressed understanding.  °

## 2018-09-20 NOTE — Progress Notes (Signed)
Subjective:    Patient ID: Ruth Schmidt, female    DOB: Dec 06, 1970, 48 y.o.   MRN: [id number]  HPI  Here to f/u; overall doing ok,  Pt denies chest pain, increasing sob or doe, wheezing, orthopnea, PND, increased LE swelling, palpitations, dizziness or syncope.  Pt denies new neurological symptoms such as new headache, or facial or extremity weakness or numbness.  Pt denies polydipsia, polyuria, or low sugar episode.  Pt states overall good compliance with meds, mostly trying to follow appropriate diet, with wt overall stable,  but little exercise however.  No new complaints  Still has ongoing increased pigmentation areas to legs, plans to see new dermatologist Past Medical History:  Diagnosis Date  . Abnormal Pap smear of cervix   . Hypertension    Past Surgical History:  Procedure Laterality Date  . CESAREAN SECTION    . TUBAL LIGATION Bilateral 2005    reports that she has never smoked. She has never used smokeless tobacco. She reports current alcohol use. She reports that she does not use drugs. family history includes Breast cancer (age of onset: 6868) in her maternal aunt; Cancer in her unknown relative; Colon cancer in her maternal uncle; Coronary artery disease (age of onset: 3570) in her maternal grandfather; Diabetes in her father and paternal grandmother; Hyperlipidemia in her father; Hypertension in her maternal grandmother and mother; Lung cancer in her maternal uncle; Stroke (age of onset: 6365) in her maternal aunt. No Known Allergies Current Outpatient Medications on File Prior to Visit  Medication Sig Dispense Refill  . fluticasone (FLONASE) 50 MCG/ACT nasal spray Place 2 sprays into both nostrils daily. 16 g 6   No current facility-administered medications on file prior to visit.    Review of Systems  Constitutional: Negative for other unusual diaphoresis or sweats HENT: Negative for ear discharge or swelling Eyes: Negative for other worsening visual disturbances  Respiratory: Negative for stridor or other swelling  Gastrointestinal: Negative for worsening distension or other blood Genitourinary: Negative for retention or other urinary change Musculoskeletal: Negative for other MSK pain or swelling Skin: Negative for color change or other new lesions Neurological: Negative for worsening tremors and other numbness  Psychiatric/Behavioral: Negative for worsening agitation or other fatigue All other system neg per pt    Objective:   Physical Exam BP 126/84   Pulse 94   Temp 98.2 F (36.8 C) (Oral)   Ht 5\' 3"  (1.6 m)   Wt 153 lb (69.4 kg)   SpO2 99%   BMI 27.10 kg/m  VS noted,  Constitutional: Pt appears in NAD HENT: Head: NCAT.  Right Ear: External ear normal.  Left Ear: External ear normal.  Eyes: . Pupils are equal, round, and reactive to light. Conjunctivae and EOM are normal Nose: without d/c or deformity Neck: Neck supple. Gross normal ROM Cardiovascular: Normal rate and regular rhythm.   Pulmonary/Chest: Effort normal and breath sounds without rales or wheezing.  Abd:  Soft, NT, ND, + BS, no organomegaly Neurological: Pt is alert. At baseline orientation, motor grossly intact Skin: Skin is warm. No rashes, other new lesions, no LE edema Psychiatric: Pt behavior is normal without agitation  No other exam findings Lab Results  Component Value Date   WBC 2.9 (L) 09/20/2018   HGB 12.2 09/20/2018   HCT 37.4 09/20/2018   PLT 238.0 09/20/2018   GLUCOSE 62 (L) 09/20/2018   CHOL 190 09/20/2018   TRIG 81.0 09/20/2018   HDL 63.50 09/20/2018   LDLDIRECT  121.7 01/04/2013   LDLCALC 110 (H) 09/20/2018   ALT 19 09/20/2018   AST 16 09/20/2018   NA 139 09/20/2018   K 3.5 09/20/2018   CL 104 09/20/2018   CREATININE 0.78 09/20/2018   BUN 9 09/20/2018   CO2 26 09/20/2018   TSH 1.74 10/27/2017   HGBA1C 5.3 10/27/2017      Assessment & Plan:

## 2018-09-20 NOTE — Patient Instructions (Signed)
Please continue all other medications as before, and refills have been done if requested.  Please have the pharmacy call with any other refills you may need.  Please continue your efforts at being more active, low cholesterol diet, and weight control.  Please keep your appointments with your specialists as you may have planned  Please go to the LAB in the Basement (turn left off the elevator) for the tests to be done today  You will be contacted by phone if any changes need to be made immediately.  Otherwise, you will receive a letter about your results with an explanation, but please check with MyChart first.  Please remember to sign up for MyChart if you have not done so, as this will be important to you in the future with finding out test results, communicating by private email, and scheduling acute appointments online when needed.  Please return in after Mar 21 2018, or sooner if needed, with Lab testing done 3-5 days before

## 2018-09-21 ENCOUNTER — Encounter: Payer: Self-pay | Admitting: Internal Medicine

## 2018-09-21 NOTE — Assessment & Plan Note (Signed)
Pt states will call for new derm appt,  to f/u any worsening symptoms or concerns

## 2018-09-21 NOTE — Assessment & Plan Note (Signed)
Cont replacement, for lab f/u with next labs

## 2018-09-21 NOTE — Assessment & Plan Note (Signed)
stable overall by history and exam, recent data reviewed with pt, and pt to continue medical treatment as before,  to f/u any worsening symptoms or concerns  

## 2018-09-21 NOTE — Assessment & Plan Note (Signed)
For f/u with next labs,  to f/u any worsening symptoms or concerns 

## 2018-10-18 ENCOUNTER — Inpatient Hospital Stay
Admit: 2018-10-18 | Discharge: 2018-10-18 | Disposition: A | Discharge status: Home / Self Care | Payer: PRIVATE HEALTH INSURANCE | Attending: Emergency Medicine

## 2018-10-18 DIAGNOSIS — R0789 Other chest pain: Secondary | ICD-10-CM

## 2018-10-18 LAB — EKG, 12 LEAD, INITIAL
Atrial Rate: 58 {beats}/min
Calculated P Axis: 43 degrees
Calculated R Axis: 12 degrees
Calculated T Axis: 38 degrees
P-R Interval: 232 ms
Q-T Interval: 414 ms
QRS Duration: 78 ms
QTC Calculation (Bezet): 406 ms
Ventricular Rate: 58 {beats}/min

## 2018-10-18 LAB — SAMPLES BEING HELD: SAMPLES BEING HELD: 1

## 2018-10-18 LAB — TROPONIN I: Troponin-I, Qt.: <0.05 ng/mL (ref <0.05)

## 2018-10-18 LAB — URINE CULTURE HOLD SAMPLE

## 2018-10-18 LAB — EKG 12-LEAD
Atrial Rate: 58 {beats}/min
P Axis: 43 degrees
P-R Interval: 232 ms
Q-T Interval: 414 ms
QRS Duration: 78 ms
QTc Calculation (Bazett): 406 ms
R Axis: 12 degrees
T Axis: 38 degrees
Ventricular Rate: 58 {beats}/min

## 2018-10-18 LAB — TROPONIN: Troponin I: <0.05 ng/mL (ref <0.05)

## 2018-10-18 MED ORDER — DICLOFENAC 75 MG TAB, DELAYED RELEASE
75 mg | ORAL_TABLET | Freq: Two times a day (BID) | ORAL | 0 refills | Status: DC
Start: 2018-10-18 — End: 2018-10-25

## 2018-10-18 NOTE — ED Triage Notes (Addendum)
Arrives via POV from [facility] for c/o mid sternal chest pain that radiates to right shoulder/arm and right upper back that started this morning. Pain worsens with movement. Tender to touch earlier, but not currently.  bloodwork and CXR negative at [facility].

## 2018-10-18 NOTE — ED Provider Notes (Signed)
HPI the patient developed a sharp right-sided chest pain this morning at around 11 AM.  The pain radiated into her right shoulder and right upper back.  The pain is pleuritic and mechanical.  She denies shortness of breath, cough, fever, nausea/vomiting, abdominal pain or other complaints.  The pain has eased off at the present time.  [facility] and had normal blood tests and chest x-ray.  She was referred here for further eval.    Past Medical History:   Diagnosis Date   ??? Anemia     Last CBC: h/h 10.1/29.8 (10/2008)   ??? Genital herpes    ??? History of loop electrical excision procedure (LEEP)    ??? 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 Repeat Pap in 1 year   ??? Hypertension    ??? Migraine     None in years, 3y, since have a piercing in upper ear after someone suggested this could help   ??? Vitamin D deficiency 10/14/2013       Past Surgical History:   Procedure Laterality Date   ??? HX CESAREAN SECTION  1995, 2012    x2   ??? HX DILATION AND CURETTAGE  04/14/14   ??? HX LEEP PROCEDURE  02/2013    with colposcopy   ??? HX OTHER SURGICAL Right 2007, 02/2013    Eye surgery, ptyergium removal   ??? HX TUBAL LIGATION  01/2011         Family History:   Problem Relation Age of Onset   ??? Hypertension Mother    ??? Cancer Maternal Grandmother         cervical?   ??? Cancer Maternal Aunt         cervical?   ??? Other Father         Just had legs amputated due to blood clots and gangrene, no DM   ??? Hypertension Sister    ??? No Known Problems Brother    ??? No Known Problems Maternal Grandfather    ??? Diabetes Paternal Grandmother    ??? No Known Problems Paternal Grandfather    ??? No Known Problems Brother    ??? Hypertension Sister    ??? No Known Problems Sister    ??? No Known Problems Son    ??? No Known Problems Son    ??? No Known Problems Daughter    ??? Breast Cancer Neg Hx        Social History     Socioeconomic History   ??? Marital status: SINGLE     Spouse name: Not on file    ??? Number of children: 3   ??? Years of education: Not on file   ??? Highest education level: Not on file   Occupational History   ??? Occupation: Runner, broadcasting/film/videoteacher     Comment: [employer]   ??? Occupation: independent Psychologist, sport and exercisebusiness owner     Comment: Runner, broadcasting/film/videosales products, in partnership with [employer]    Social Needs   ??? Financial resource strain: Not on file   ??? Food insecurity     Worry: Not on file     Inability: Not on file   ??? Transportation needs     Medical: Not on file     Non-medical: Not on file   Tobacco Use   ??? Smoking status: Never Smoker   ??? Smokeless tobacco: Never Used   Substance and Sexual Activity   ???  Alcohol use: Yes     Alcohol/week: 0.0 standard drinks     Comment: glasses of wine occasional, less than once a month   ??? Drug use: No   ??? Sexual activity: Yes     Partners: Male     Birth control/protection: None, Surgical     Comment: monogamous with partner since about 2014   Lifestyle   ??? Physical activity     Days per week: Not on file     Minutes per session: Not on file   ??? Stress: Not on file   Relationships   ??? Social Product manager on phone: Not on file     Gets together: Not on file     Attends religious service: Not on file     Active member of club or organization: Not on file     Attends meetings of clubs or organizations: Not on file     Relationship status: Not on file   ??? Intimate partner violence     Fear of current or ex partner: Not on file     Emotionally abused: Not on file     Physically abused: Not on file     Forced sexual activity: Not on file   Other Topics Concern   ??? Not on file   Social History Narrative    Lives in [place] with two sons (one in school at [employer]), one dtr (after [place]), mother  Originally from [place]         Earned EdD at [employer] in 2018 in education.          ALLERGIES: Influenza virus vaccine, specific; Lexapro [escitalopram]; and Lisinopril    Review of Systems   Constitutional: Negative for fever.   HENT: Negative for voice change.    Eyes: Negative for pain.    Respiratory: Negative for cough and shortness of breath.    Cardiovascular: Positive for chest pain.   Gastrointestinal: Negative for abdominal pain, nausea and vomiting.   Genitourinary: Negative for flank pain.   Musculoskeletal: Positive for back pain.   Skin: Negative for rash.   Neurological: Negative for headaches.   Psychiatric/Behavioral: Negative for confusion.       Vitals:    10/18/18 1358 10/18/18 1543   BP: (!) 150/97 (!) 146/91   Pulse: 74 (!) 58   Resp: 16 16   Temp: 98.4 ??F (36.9 ??C)    SpO2: 98% 99%   Weight: 80.7 kg (178 lb)    Height: 5\' 7"  (1.702 m)             Physical Exam  Constitutional:       General: She is not in acute distress.     Appearance: She is well-developed.   HENT:      Head: Normocephalic.   Neck:      Musculoskeletal: Normal range of motion.   Cardiovascular:      Rate and Rhythm: Normal rate.      Heart sounds: No murmur.   Pulmonary:      Effort: Pulmonary effort is normal.      Breath sounds: Normal breath sounds.   Abdominal:      Palpations: Abdomen is soft.      Tenderness: There is no abdominal tenderness.   Musculoskeletal: Normal range of motion.   Skin:     General: Skin is warm and dry.      Capillary Refill: Capillary refill takes less than 2 seconds.  Neurological:      Mental Status: She is alert.   Psychiatric:         Behavior: Behavior normal.          MDM       Procedures ED EKG interpretation:  Rhythm:  s brady rate 58. No acute changes.This EKG was interpreted by Harrell Gave, MD,ED Provider.

## 2018-10-18 NOTE — ED Notes (Signed)
Discharge instructions given to patient, all questions answered and patient verbalized understanding.  Patient ambulatory to ED lobby

## 2018-10-18 NOTE — Telephone Encounter (Signed)
Patient having chest pains,moving to upper back and right arm,patient advised to go directly to ER/Urgent care.

## 2018-10-18 NOTE — ED Provider Notes (Signed)
HPI the patient developed a sharp right-sided chest pain this morning at around 11 AM.  The pain radiated into her right shoulder and right upper back.  The pain is pleuritic and mechanical.  She denies shortness of breath, cough, fever, nausea/vomiting, abdominal pain or other complaints.  The pain has eased off at the present time.  [facility] and had normal blood tests and chest x-ray.  She was referred here for further eval.    Past Medical History:   Diagnosis Date   ??? Anemia     Last CBC: h/h 10.1/29.8 (10/2008)   ??? Genital herpes    ??? History of loop electrical excision procedure (LEEP)    ??? 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 Repeat Pap in 1 year   ??? Hypertension    ??? Migraine     None in years, 3y, since have a piercing in upper ear after someone suggested this could help   ??? Vitamin D deficiency 10/14/2013       Past Surgical History:   Procedure Laterality Date   ??? HX CESAREAN SECTION  1995, 2012    x2   ??? HX DILATION AND CURETTAGE  04/14/14   ??? HX LEEP PROCEDURE  02/2013    with colposcopy   ??? HX OTHER SURGICAL Right 2007, 02/2013    Eye surgery, ptyergium removal   ??? HX TUBAL LIGATION  01/2011         Family History:   Problem Relation Age of Onset   ??? Hypertension Mother    ??? Cancer Maternal Grandmother         cervical?   ??? Cancer Maternal Aunt         cervical?   ??? Other Father         Just had legs amputated due to blood clots and gangrene, no DM   ??? Hypertension Sister    ??? No Known Problems Brother    ??? No Known Problems Maternal Grandfather    ??? Diabetes Paternal Grandmother    ??? No Known Problems Paternal Grandfather    ??? No Known Problems Brother    ??? Hypertension Sister    ??? No Known Problems Sister    ??? No Known Problems Son    ??? No Known Problems Son    ??? No Known Problems Daughter    ??? Breast Cancer Neg Hx        Social History     Socioeconomic History   ??? Marital status: SINGLE     Spouse name: Not on file   ???  Number of children: 3   ??? Years of education: Not on file   ??? Highest education level: Not on file   Occupational History   ??? Occupation: Runner, broadcasting/film/videoteacher     Comment: [employer]   ??? Occupation: independent Psychologist, sport and exercisebusiness owner     Comment: Runner, broadcasting/film/videosales products, in partnership with [employer]    Social Needs   ??? Financial resource strain: Not on file   ??? Food insecurity     Worry: Not on file     Inability: Not on file   ??? Transportation needs     Medical: Not on file     Non-medical: Not on file   Tobacco Use   ??? Smoking status: Never Smoker   ??? Smokeless tobacco: Never Used   Substance and Sexual Activity   ???  Alcohol use: Yes     Alcohol/week: 0.0 standard drinks     Comment: glasses of wine occasional, less than once a month   ??? Drug use: No   ??? Sexual activity: Yes     Partners: Male     Birth control/protection: None, Surgical     Comment: monogamous with partner since about 2014   Lifestyle   ??? Physical activity     Days per week: Not on file     Minutes per session: Not on file   ??? Stress: Not on file   Relationships   ??? Social Product manager on phone: Not on file     Gets together: Not on file     Attends religious service: Not on file     Active member of club or organization: Not on file     Attends meetings of clubs or organizations: Not on file     Relationship status: Not on file   ??? Intimate partner violence     Fear of current or ex partner: Not on file     Emotionally abused: Not on file     Physically abused: Not on file     Forced sexual activity: Not on file   Other Topics Concern   ??? Not on file   Social History Narrative    Lives in [place] with two sons (one in school at [employer]), one dtr (after [place]), mother  Originally from [place]         Earned EdD at [employer] in 2018 in education.          ALLERGIES: Influenza virus vaccine, specific; Lexapro [escitalopram]; and Lisinopril    Review of Systems   Constitutional: Negative for fever.   HENT: Negative for voice change.    Eyes: Negative for pain.    Respiratory: Negative for cough and shortness of breath.    Cardiovascular: Positive for chest pain.   Gastrointestinal: Negative for abdominal pain, nausea and vomiting.   Genitourinary: Negative for flank pain.   Musculoskeletal: Positive for back pain.   Skin: Negative for rash.   Neurological: Negative for headaches.   Psychiatric/Behavioral: Negative for confusion.       Vitals:    10/18/18 1358 10/18/18 1543   BP: (!) 150/97 (!) 146/91   Pulse: 74 (!) 58   Resp: 16 16   Temp: 98.4 ??F (36.9 ??C)    SpO2: 98% 99%   Weight: 80.7 kg (178 lb)    Height: 5\' 7"  (1.702 m)             Physical Exam  Constitutional:       General: She is not in acute distress.     Appearance: She is well-developed.   HENT:      Head: Normocephalic.   Neck:      Musculoskeletal: Normal range of motion.   Cardiovascular:      Rate and Rhythm: Normal rate.      Heart sounds: No murmur.   Pulmonary:      Effort: Pulmonary effort is normal.      Breath sounds: Normal breath sounds.   Abdominal:      Palpations: Abdomen is soft.      Tenderness: There is no abdominal tenderness.   Musculoskeletal: Normal range of motion.   Skin:     General: Skin is warm and dry.      Capillary Refill: Capillary refill takes less than 2 seconds.  Neurological:      Mental Status: She is alert.   Psychiatric:         Behavior: Behavior normal.          MDM       Procedures ED EKG interpretation:  Rhythm:  s brady rate 58. No acute changes.This EKG was interpreted by Harrell Gave, MD,ED Provider.

## 2018-10-18 NOTE — ED Notes (Signed)
Arrives via P[facility] from [facility] for c/o mid sternal chest pain that radiates to right shoulder/arm and right upper back that started this morning. Pain worsens with movement. Tender to touch earlier, but not currently.  bloodwork and CXR negative at [facility].

## 2018-10-22 ENCOUNTER — Ambulatory Visit: Attending: Neurology | Primary: Family

## 2018-10-25 ENCOUNTER — Telehealth: Admit: 2018-10-25 | Payer: PRIVATE HEALTH INSURANCE | Attending: Neurology | Primary: Family

## 2018-10-25 ENCOUNTER — Telehealth: Attending: Neurology | Primary: Family

## 2018-10-25 DIAGNOSIS — F419 Anxiety disorder, unspecified: Secondary | ICD-10-CM

## 2018-10-25 NOTE — Progress Notes (Signed)
[phone]  Follow up for memory loss.  Patient states her memory is about the same from when she last saw Dr.Ransom in December of 2019.

## 2018-10-25 NOTE — Patient Instructions (Signed)
PRESCRIPTION REFILL POLICY    [facility]   Statement to Patients  June 19, 2012      In an effort to ensure the large volume of patient prescription refills is processed in the most efficient and expeditious manner, we are asking our patients to assist us by calling your Pharmacy for all prescription refills, this will include also your  Mail Order Pharmacy. The pharmacy will contact our office electronically to continue the refill process.    Please do not wait until the last minute to call your pharmacy. We need at least 48 hours (2days) to fill prescriptions. We also encourage you to call your pharmacy before going to pick up your prescription to make sure it is ready.     With regard to controlled substance prescription refill requests (narcotic refills) that need to be picked up at our office, we ask your cooperation by providing us with at least 72 hours (3days) notice that you will need a refill.    We will not refill narcotic prescription refill requests after 4:00pm on any weekday, Monday through Thursday, or after 2:00pm on Fridays, or on the weekends.      We encourage everyone to explore another way of getting your prescription refill request processed using MyChart, our patient web portal through our electronic medical record system. MyChart is an efficient and effective way to communicate your medication request directly to the office and  downloadable as an app on your smart phone . MyChart also features a review functionality that allows you to view your medication list as well as leave messages for your physician. Are you ready to get connected? If so please review the attatched instructions or speak to any of our staff to get you set up right away!    Thank you so much for your cooperation. Should you have any questions please contact our Practice Administrator.    The Physicians and Staff,  [facility]

## 2018-10-25 NOTE — Progress Notes (Signed)
Neurology Consult Note      HISTORY PROVIDED BY: patient  Michelle Benitez is a 48 y.o. female who was seen by synchronous (real-time) audio-video technology on 10/25/2018.  Two factor identification completed.     Consent:  She and/or her healthcare decision maker is aware that this patient-initiated Telehealth encounter is a billable service, with coverage as determined by her insurance carrier. She is aware that she may receive a bill and has provided verbal consent to proceed: Yes    I was in the office while conducting this encounter.    I discussed with the patient the nature of our telemedicine visit: Our sessions are not being recorded and personal health information is protected. We will provide follow up care in person when the patient needs it.     Pursuant to the emergency declaration under the [facility] Act and the [employer], 1135 waiver authority and the [employer] Act, this Virtual  Visit was conducted, with patient's consent, to reduce the patient's risk of exposure to COVID-19 and provide continuity of care for an established patient.    Chief Complaint:   Chief Complaint   Patient presents with   ??? Memory Loss     [phone]      Subjective:   Pt is a 48 y.o. right handed female last seen in clinic on 03/07/18 in f/u for memory loss, initially presenting in July, 2019 with c/o memory loss since 2015 which she felt was progressive, gave examples of forgetting her address of 8 years and losing train of thought. Cognitive difficulties had improved with improved sleep. New c/o intermittent lightheadedness with standing.  Exam with MMSE score of 30/30, o/w was non-focal and unremarkable. MRI brain wo contrast 10/27/17 - normal. Low suspicion for any concerning etiology for complaints.  Recommended proceeding with EEG. Suspected LH due to little PO intake during the day, could be related to anithypertensive meds.  Recommended f/u with PCP.     This is a virtual f/u visit. She felt like she was doing better prior to COVID-19 pandemic.  She hasn't been sleeping well since the pandemic started. Memory is not worse. It is little things, went to the car to get her charger and then couldn't remember what she went to the car for, remembered as she was walking back in to house.  She went to ED due to CP, not cardiac. In hindsight, she was working out the day before and may have lifted due too much. She had gained 20lbs and is trying to lose wt. No new complaints.      Past Medical History:   Diagnosis Date   ??? Anemia     Last CBC: h/h 10.1/29.8 (10/2008)   ??? Genital herpes    ??? History of loop electrical excision procedure (LEEP)    ??? 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 Repeat Pap in 1 year   ??? Hypertension    ??? Migraine     None in years, 3y, since have a piercing in upper ear after someone suggested this could help   ??? Vitamin D deficiency 10/14/2013      Past Surgical History:   Procedure Laterality Date   ??? HX CESAREAN SECTION  1995, 2012    x2   ??? HX DILATION AND CURETTAGE  04/14/14   ??? HX LEEP PROCEDURE  02/2013    with colposcopy   ??? HX  OTHER SURGICAL Right 2007, 02/2013    Eye surgery, ptyergium removal   ??? HX TUBAL LIGATION  01/2011      Social History     Socioeconomic History   ??? Marital status: SINGLE     Spouse name: Not on file   ??? Number of children: 3   ??? Years of education: Not on file   ??? Highest education level: Not on file   Occupational History   ??? 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 Occupation: independent Armed forces operational officer     Comment: Visual merchandiser, in partnership with [employer]    Social Needs   ??? Financial resource strain: Not on file   ??? Food insecurity     Worry: Not on file     Inability: Not on file   ??? Transportation needs     Medical: Not on file     Non-medical: Not on file   Tobacco Use   ??? Smoking status: Never Smoker    ??? Smokeless tobacco: Never Used   Substance and Sexual Activity   ??? Alcohol use: Yes     Alcohol/week: 0.0 standard drinks     Comment: glasses of wine occasional, less than once a month   ??? Drug use: No   ??? Sexual activity: Yes     Partners: Male     Birth control/protection: None, Surgical     Comment: monogamous with partner since about 2014   Lifestyle   ??? Physical activity     Days per week: Not on file     Minutes per session: Not on file   ??? Stress: Not on file   Relationships   ??? Social Product manager on phone: Not on file     Gets together: Not on file     Attends religious service: Not on file     Active member of club or organization: Not on file     Attends meetings of clubs or organizations: Not on file     Relationship status: Not on file   ??? Intimate partner violence     Fear of current or ex partner: Not on file     Emotionally abused: Not on file     Physically abused: Not on file     Forced sexual activity: Not on file   Other Topics Concern   ??? Not on file   Social History Narrative    Lives in [place] with two sons (one in school at [employer]), one dtr (after [place]), mother  Originally from [place]         Earned EdD at [employer] in 2018 in education.      Family History   Problem Relation Age of Onset   ??? Hypertension Mother    ??? Cancer Maternal Grandmother         cervical?   ??? Cancer Maternal Aunt         cervical?   ??? Other Father         Just had legs amputated due to blood clots and gangrene, no DM   ??? Hypertension Sister    ??? No Known Problems Brother    ??? No Known Problems Maternal Grandfather    ??? Diabetes Paternal Grandmother    ??? No Known Problems Paternal Grandfather    ??? No Known Problems Brother    ??? Hypertension Sister    ??? No Known Problems  Sister    ??? No Known Problems Son    ??? No Known Problems Son    ??? No Known Problems Daughter    ??? Breast Cancer Neg Hx          Objective:   ROS:  Per HPI o/w neg.     Allergies   Allergen Reactions   ??? Influenza Virus Vaccine, Specific Swelling      Happened 2 years in a row.   ??? Lexapro [Escitalopram] Other (comments)     insomnia   ??? Lisinopril Angioedema     Lip swelling on 07/10/13 while taking lisinopril-HCTZ        Meds:    Current Outpatient Medications:   ???  valACYclovir (VALTREX) 1 gram tablet, TAKE 1 TABLET BY MOUTH DAILY, Disp: 90 Tab, Rfl: 0  ???  amLODIPine (NORVASC) 5 mg tablet, TAKE 1 TABLET BY MOUTH EVERY DAY  Indications: high blood pressure, Disp: 90 Tab, Rfl: 3  ???  indapamide (LOZOL) 1.25 mg tablet, TAKE 1 TABLET BY MOUTH DAILY  Indications: high blood pressure, Disp: 90 Tab, Rfl: 3      Imaging:  MRI Results (most recent):  Results from Hospital Encounter encounter on 10/27/17   MRI BRAIN WO CONT    Narrative EXAM: MRI BRAIN WO CONT    INDICATION: Pt with memory loss, progressively worsening over last few years,  eval for structural etiology    COMPARISON: None.    CONTRAST: None.    TECHNIQUE:    Multiplanar multisequence acquisition without contrast of the brain.    FINDINGS:  The ventricles are normal in size and position. Minimal scattered white matter  T2/FLAIR hyperintensities, most prominent in the periatrial regions, as well as  minimal patchy T2/FLAIR hyperintensity in the pons, consistent with minimal  chronic microvascular ischemic disease. There is no acute infarct, hemorrhage,  extra-axial fluid collection, or mass effect. There is no cerebellar tonsillar  herniation. Expected arterial flow-voids are present.    The paranasal sinuses, mastoid air cells, and middle ears are clear. The orbital  contents are within normal limits. No significant osseous or scalp lesions are  identified.      Impression IMPRESSION:     1. No evidence of acute or significant intracranial abnormality. Minimal chronic  microvascular ischemic disease.        CT Results (most recent):  Results from Hospital Encounter encounter on 02/07/18   CTA NECK    Narrative INDICATION:    EXAMINATION:  CT ANGIOGRAPHY HEAD AND NECK     COMPARISON: None      TECHNIQUE:  Following the uneventful administration of iodinated contrast  material, axial CT angiography of the head and neck was performed. Delayed axial  images through the head were also obtained. Coronal and sagittal reconstructions  were obtained. Manual postprocessing of images was performed. 3-D  Sagittal  maximal intensity projection images were obtained.  3-D Coronal maximal  intensity projections were obtained.  CT dose reduction was achieved through use  of a standardized protocol tailored for this examination and automatic exposure  control for dose modulation.    FINDINGS:    CTA NECK:    Aortic Arch: No significant abnormality.  Right Common Carotid Artery: No significant abnormality.  Right Internal Carotid Artery: No significant abnormality.  NASCET Right: 0-25%.  Left Common Carotid Artery: No significant abnormality.  Left Internal Carotid Artery: No significant abnormality.  NASCET Left: 0-25%..  Carotid stenosis determined using NASCET criteria.  Right Vertebral Artery:  No significant abnormality.  Left Vertebral Artery: No significant abnormality.  Cervical Soft Tissues: No significant abnormality.  Lung Apices: No significant abnormality.  Bones: No destructive bone lesion.  Additional Comments: N/A.    CTA HEAD:    Posterior Circulation: No flow limiting stenosis or occlusion.  Anterior Circulation: No flow limiting stenosis or occlusion.  Additional Comments: 2 mm conical outpouching origin the left posterior  communicating artery most consistent with infundibulum. No evidence of aneurysm.      Impression IMPRESSION:    No acute process. No arterial thrombosis/occlusion, dissection, or flow-limiting  stenosis.        Reviewed records in [facility] and media tab today    Lab Review   Results for orders placed or performed during the hospital encounter of 10/18/18   URINE CULTURE HOLD SAMPLE    Specimen: Serum   Result Value Ref Range    Urine culture hold         Urine on hold in Microbiology dept for 2 days.  If unpreserved urine is submitted, it cannot be used for addtional testing after 24 hours, recollection will be required.   SAMPLES BEING HELD   Result Value Ref Range    SAMPLES BEING HELD 1 red, 1 purple, 1 ua, 1 blue     COMMENT        Add-on orders for these samples will be processed based on acceptable specimen integrity and analyte stability, which may vary by analyte.   TROPONIN I   Result Value Ref Range    Troponin-I, Qt. <0.05 <0.05 ng/mL   EKG, 12 LEAD, INITIAL   Result Value Ref Range    Ventricular Rate 58 BPM    Atrial Rate 58 BPM    P-R Interval 232 ms    QRS Duration 78 ms    Q-T Interval 414 ms    QTC Calculation (Bezet) 406 ms    Calculated P Axis 43 degrees    Calculated R Axis 12 degrees    Calculated T Axis 38 degrees    Diagnosis       Sinus bradycardia with 1st degree AV block  When compared with ECG of 07-Feb-2018 12:09,  Criteria for Septal infarct are no longer present  Confirmed by Alleen Borneo, Peter [phone]([id number]) on 10/18/2018 3:15:45 PM        Limited due to VV  Exam:  There were no vitals taken for this visit.   No flowsheet data found.     General:  Alert, cooperative, no distress.   Head:  Normocephalic, without obvious abnormality, atraumatic.   Respiratory:  Heart:   Non labored breathing     Neck:      Extremities:    Pulses:        Neurologic:  MS: Alert and oriented x 4, speech intact. Language- intact Attention and fund of knowledge appropriate.  Recent and remote memory intact.  Cranial Nerves:  II: visual fields    II: pupils    II: optic disc    III,VII: ptosis none   III,IV,VI: extraocular muscles  EOMI, no nystagmus, no diplopia   V: facial light touch sensation     VII: facial muscle function   symmetric   VIII: hearing intact   IX: soft palate elevation     XI: trapezius strength     XI: sternocleidomastoid strength    XII: tongue       Motor:  no tremor, no PD  At 09/2017 OV:  Sensory: intact to  LT, PP   Coordination: FTN and HTS intact, RAM intact  Gait: normal gait, able to tandem walk  Reflexes: 2+ symmetric, toes downgoing    Mini Mental State Exam 03/07/2018   What is the Year 1   What is the Season 1   What is the Date 1   What is the Day 1   What is the Month 1   Where are we State 1   Where are we Country 1   Where are we [place] or [place] 1   Where are we Floor 1   Name three objects, then ask the patient to say them 3   Serial sevens Subtract 7 from 100 in increments 5   Ask for the three objects repeated above 3   Name a pencil 1   Name a watch 1   Have the patient repeat this phrase "No ifs, ands, or buts" 1   Three stage command: Take the paper in your right hand 1   Fold the paper in half 1   Put the paper on the floor 1   Read and obey the following: CLOSE YOUR EYES 1   Have the patient write a sentence 1   Have the patient copy a figure 1   Mini Mental Score 30                Assessment/Plan   Pt is a 48 y.o. right handed female with concerns about memory loss, initially presenting in July, 2019 with c/o memory loss since 2015 which she felt was progressive, gave examples of forgetting her address of 8 years and losing train of thought. Cognitive difficulties had improved with improved sleep. Now more forgetful with worsened anxiety during the pandemic.  Exam limited due to VV, appears non-focal and unremarkable. MRI brain wo contrast 10/27/17 - normal. Still low suspicion for any concerning etiology for complaints.  Discussed anxiety and insomnia and interfering with cognitive function. She is not interested in taking medications. She would like to talk to a counselor. Referral placed. F/u as needed.       ICD-10-CM ICD-9-CM    1. 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 Memory loss  R41.3 780.93        Signed:  Jodi MourningMary M Ronald Londo, MD  10/25/2018

## 2018-10-25 NOTE — Progress Notes (Signed)
Neurology Consult Note      HISTORY PROVIDED BY: patient  Michelle Benitez is a 48 y.o. female who was seen by synchronous (real-time) audio-video technology on 10/25/2018.  Two factor identification completed.     Consent:  She and/or her healthcare decision maker is aware that this patient-initiated Telehealth encounter is a billable service, with coverage as determined by her insurance carrier. She is aware that she may receive a bill and has provided verbal consent to proceed: Yes    I was in the office while conducting this encounter.    I discussed with the patient the nature of our telemedicine visit: Our sessions are not being recorded and personal health information is protected. We will provide follow up care in person when the patient needs it.     Pursuant to the emergency declaration under the [facility] Act and the [employer] Emergencies Act, 1135 waiver authority and the [employer]onavirus Preparedness and [employer] Supplemental Appropriations Act, this Virtual  Visit was conducted, with patient's consent, to reduce the patient's risk of exposure to COVID-19 and provide continuity of care for an established patient.    Chief Complaint:   Chief Complaint   Patient presents with   ??? Memory Loss     [phone]      Subjective:   Pt is a 48 y.o. right handed female last seen in clinic on 03/07/18 in f/u for memory loss, initially presenting in July, 2019 with c/o memory loss since 2015 which she felt was progressive, gave examples of forgetting her address of 8 years and losing train of thought. Cognitive difficulties had improved with improved sleep. New c/o intermittent lightheadedness with standing.  Exam with MMSE score of 30/30, o/w was non-focal and unremarkable. MRI brain wo contrast 10/27/17 - normal. Low suspicion for any concerning etiology for complaints.  Recommended proceeding with EEG. Suspected LH due to little PO intake during the day, could be related to anithypertensive meds.  Recommended f/u with  PCP.    This is a virtual f/u visit. She felt like she was doing better prior to COVID-19 pandemic.  She hasn't been sleeping well since the pandemic started. Memory is not worse. It is little things, went to the car to get her charger and then couldn't remember what she went to the car for, remembered as she was walking back in to house.  She went to ED due to CP, not cardiac. In hindsight, she was working out the day before and may have lifted due too much. She had gained 20lbs and is trying to lose wt. No new complaints.      Past Medical History:   Diagnosis Date   ??? Anemia     Last CBC: h/h 10.1/29.8 (10/2008)   ??? Genital herpes    ??? History of loop electrical excision procedure (LEEP)    ??? 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 Repeat Pap in 1 year   ??? Hypertension    ??? Migraine     None in years, 3y, since have a piercing in upper ear after someone suggested this could help   ??? Vitamin D deficiency 10/14/2013      Past Surgical History:   Procedure Laterality Date   ??? HX CESAREAN SECTION  1995, 2012    x2   ??? HX DILATION AND CURETTAGE  04/14/14   ??? HX LEEP PROCEDURE  02/2013    with colposcopy   ??? HX  OTHER SURGICAL Right 2007, 02/2013    Eye surgery, ptyergium removal   ??? HX TUBAL LIGATION  01/2011      Social History     Socioeconomic History   ??? Marital status: SINGLE     Spouse name: Not on file   ??? Number of children: 3   ??? Years of education: Not on file   ??? Highest education level: Not on file   Occupational History   ??? Occupation: Runner, broadcasting/film/videoteacher     Comment: [employer]   ??? Occupation: independent Psychologist, sport and exercisebusiness owner     Comment: Runner, broadcasting/film/videosales products, in partnership with [employer]    Social Needs   ??? Financial resource strain: Not on file   ??? Food insecurity     Worry: Not on file     Inability: Not on file   ??? Transportation needs     Medical: Not on file     Non-medical: Not on file   Tobacco Use   ??? Smoking status: Never Smoker   ??? Smokeless tobacco:  Never Used   Substance and Sexual Activity   ??? Alcohol use: Yes     Alcohol/week: 0.0 standard drinks     Comment: glasses of wine occasional, less than once a month   ??? Drug use: No   ??? Sexual activity: Yes     Partners: Male     Birth control/protection: None, Surgical     Comment: monogamous with partner since about 2014   Lifestyle   ??? Physical activity     Days per week: Not on file     Minutes per session: Not on file   ??? Stress: Not on file   Relationships   ??? Social Wellsite geologistconnections     Talks on phone: Not on file     Gets together: Not on file     Attends religious service: Not on file     Active member of club or organization: Not on file     Attends meetings of clubs or organizations: Not on file     Relationship status: Not on file   ??? Intimate partner violence     Fear of current or ex partner: Not on file     Emotionally abused: Not on file     Physically abused: Not on file     Forced sexual activity: Not on file   Other Topics Concern   ??? Not on file   Social History Narrative    [place] in [place] with two sons (one in school at [employer]), one dtr (after [employer]), mother  [place]         Earned EdD at [employer] in 2018 in education.      Family History   Problem Relation Age of Onset   ??? Hypertension Mother    ??? Cancer Maternal Grandmother         cervical?   ??? Cancer Maternal Aunt         cervical?   ??? Other Father         Just had legs amputated due to blood clots and gangrene, no DM   ??? Hypertension Sister    ??? No Known Problems Brother    ??? No Known Problems Maternal Grandfather    ??? Diabetes Paternal Grandmother    ??? No Known Problems Paternal Grandfather    ??? No Known Problems Brother    ??? Hypertension Sister    ??? No Known Problems  Sister    ??? No Known Problems Son    ??? No Known Problems Son    ??? No Known Problems Daughter    ??? Breast Cancer Neg Hx          Objective:   ROS:  Per HPI o/w neg.     Allergies   Allergen Reactions   ??? Influenza Virus Vaccine, Specific Swelling     Happened 2 years in  a row.   ??? Lexapro [Escitalopram] Other (comments)     insomnia   ??? Lisinopril Angioedema     Lip swelling on 07/10/13 while taking lisinopril-HCTZ        Meds:    Current Outpatient Medications:   ???  valACYclovir (VALTREX) 1 gram tablet, TAKE 1 TABLET BY MOUTH DAILY, Disp: 90 Tab, Rfl: 0  ???  amLODIPine (NORVASC) 5 mg tablet, TAKE 1 TABLET BY MOUTH EVERY DAY  Indications: high blood pressure, Disp: 90 Tab, Rfl: 3  ???  indapamide (LOZOL) 1.25 mg tablet, TAKE 1 TABLET BY MOUTH DAILY  Indications: high blood pressure, Disp: 90 Tab, Rfl: 3      Imaging:  MRI Results (most recent):  Results from Hospital Encounter encounter on 10/27/17   MRI BRAIN WO CONT    Narrative EXAM: MRI BRAIN WO CONT    INDICATION: Pt with memory loss, progressively worsening over last few years,  eval for structural etiology    COMPARISON: None.    CONTRAST: None.    TECHNIQUE:    Multiplanar multisequence acquisition without contrast of the brain.    FINDINGS:  The ventricles are normal in size and position. Minimal scattered white matter  T2/FLAIR hyperintensities, most prominent in the periatrial regions, as well as  minimal patchy T2/FLAIR hyperintensity in the pons, consistent with minimal  chronic microvascular ischemic disease. There is no acute infarct, hemorrhage,  extra-axial fluid collection, or mass effect. There is no cerebellar tonsillar  herniation. Expected arterial flow-voids are present.    The paranasal sinuses, mastoid air cells, and middle ears are clear. The orbital  contents are within normal limits. No significant osseous or scalp lesions are  identified.      Impression IMPRESSION:     1. No evidence of acute or significant intracranial abnormality. Minimal chronic  microvascular ischemic disease.        CT Results (most recent):  Results from Hospital Encounter encounter on 02/07/18   CTA NECK    Narrative INDICATION:    EXAMINATION:  CT ANGIOGRAPHY HEAD AND NECK     COMPARISON: None     TECHNIQUE:  Following the  uneventful administration of iodinated contrast  material, axial CT angiography of the head and neck was performed. Delayed axial  images through the head were also obtained. Coronal and sagittal reconstructions  were obtained. Manual postprocessing of images was performed. 3-D  Sagittal  maximal intensity projection images were obtained.  3-D Coronal maximal  intensity projections were obtained.  CT dose reduction was achieved through use  of a standardized protocol tailored for this examination and automatic exposure  control for dose modulation.    FINDINGS:    CTA NECK:    Aortic Arch: No significant abnormality.  Right Common Carotid Artery: No significant abnormality.  Right Internal Carotid Artery: No significant abnormality.  NASCET Right: 0-25%.  Left Common Carotid Artery: No significant abnormality.  Left Internal Carotid Artery: No significant abnormality.  NASCET Left: 0-25%..  Carotid stenosis determined using NASCET criteria.  Right Vertebral Artery:  No significant abnormality.  Left Vertebral Artery: No significant abnormality.  Cervical Soft Tissues: No significant abnormality.  Lung Apices: No significant abnormality.  Bones: No destructive bone lesion.  Additional Comments: N/A.    CTA HEAD:    Posterior Circulation: No flow limiting stenosis or occlusion.  Anterior Circulation: No flow limiting stenosis or occlusion.  Additional Comments: 2 mm conical outpouching origin the left posterior  communicating artery most consistent with infundibulum. No evidence of aneurysm.      Impression IMPRESSION:    No acute process. No arterial thrombosis/occlusion, dissection, or flow-limiting  stenosis.        Reviewed records in [facility] and media tab today    Lab Review   Results for orders placed or performed during the hospital encounter of 10/18/18   URINE CULTURE HOLD SAMPLE    Specimen: Serum   Result Value Ref Range    Urine culture hold        Urine on hold in Microbiology dept for 2 days.  If  unpreserved urine is submitted, it cannot be used for addtional testing after 24 hours, recollection will be required.   SAMPLES BEING HELD   Result Value Ref Range    SAMPLES BEING HELD 1 red, 1 purple, 1 ua, 1 blue     COMMENT        Add-on orders for these samples will be processed based on acceptable specimen integrity and analyte stability, which may vary by analyte.   TROPONIN I   Result Value Ref Range    Troponin-I, Qt. <0.05 <0.05 ng/mL   EKG, 12 LEAD, INITIAL   Result Value Ref Range    Ventricular Rate 58 BPM    Atrial Rate 58 BPM    P-R Interval 232 ms    QRS Duration 78 ms    Q-T Interval 414 ms    QTC Calculation (Bezet) 406 ms    Calculated P Axis 43 degrees    Calculated R Axis 12 degrees    Calculated T Axis 38 degrees    Diagnosis       Sinus bradycardia with 1st degree AV block  When compared with ECG of 07-Feb-2018 12:09,  Criteria for Septal infarct are no longer present  Confirmed by Alleen Borne [phone]) on 10/18/2018 3:15:45 PM        Limited due to VV  Exam:  There were no vitals taken for this visit.   No flowsheet data found.     General:  Alert, cooperative, no distress.   Head:  Normocephalic, without obvious abnormality, atraumatic.   Respiratory:  Heart:   Non labored breathing     Neck:      Extremities:    Pulses:        Neurologic:  MS: Alert and oriented x 4, speech intact. Language- intact Attention and fund of knowledge appropriate.  Recent and remote memory intact.  Cranial Nerves:  II: visual fields    II: pupils    II: optic disc    III,VII: ptosis none   III,IV,VI: extraocular muscles  EOMI, no nystagmus, no diplopia   V: facial light touch sensation     VII: facial muscle function   symmetric   VIII: hearing intact   IX: soft palate elevation     XI: trapezius strength     XI: sternocleidomastoid strength    XII: tongue       Motor:  no tremor, no PD  At 09/2017 OV:  Sensory: intact to  LT, PP  Coordination: FTN and HTS intact, RAM intact  Gait: normal gait, able to tandem  walk  Reflexes: 2+ symmetric, toes downgoing    Mini Mental State Exam 03/07/2018   What is the Year 1   What is the Season 1   What is the Date 1   What is the Day 1   What is the Month 1   Where are we State 1   Where are we Country 1   Where are we [place] or [place] 1   Where are we Floor 1   Name three objects, then ask the patient to say them 3   Serial sevens Subtract 7 from 100 in increments 5   Ask for the three objects repeated above 3   Name a pencil 1   Name a watch 1   Have the patient repeat this phrase "No ifs, ands, or buts" 1   Three stage command: Take the paper in your right hand 1   Fold the paper in half 1   Put the paper on the floor 1   Read and obey the following: [place] 1   Have the patient write a sentence 1   Have the patient copy a figure 1   Mini Mental Score 30                Assessment/Plan   Pt is a 48 y.o. right handed female with concerns about memory loss, initially presenting in July, 2019 with c/o memory loss since 2015 which she felt was progressive, gave examples of forgetting her address of 8 years and losing train of thought. Cognitive difficulties had improved with improved sleep. Now more forgetful with worsened anxiety during the pandemic.  Exam limited due to VV, appears non-focal and unremarkable. MRI brain wo contrast 10/27/17 - normal. Still low suspicion for any concerning etiology for complaints.  Discussed anxiety and insomnia and interfering with cognitive function. She is not interested in taking medications. She would like to talk to a counselor. Referral placed. F/u as needed.       ICD-10-CM ICD-9-CM    1. 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 Memory loss  R41.3 780.93        Signed:  Chauncey Reading, MD  10/25/2018

## 2018-10-25 NOTE — Progress Notes (Signed)
[phone]  Follow up for memory loss.  Patient states her memory is about the same from when she last saw Dr.Ransom in December of 2019.

## 2018-10-30 NOTE — Progress Notes (Signed)
Psychology referral faxed to [facility].

## 2018-10-30 NOTE — Progress Notes (Signed)
Psychology referral faxed to [facility].

## 2018-11-08 NOTE — Telephone Encounter (Signed)
I left a message for patient to call me back.

## 2018-11-08 NOTE — Telephone Encounter (Signed)
Michelle Benitez calling with [facility], stating they do not do direct referrals, they ask that we contact the pt and give her their info and have pt reach out to do an intake. She did say that all of their therapists and NP's are backed up and they currently have a wait list.

## 2018-11-12 ENCOUNTER — Encounter

## 2018-11-13 MED ORDER — VALACYCLOVIR 1 G TAB
1 gram | ORAL_TABLET | ORAL | 0 refills | Status: DC
Start: 2018-11-13 — End: 2019-03-04

## 2018-12-11 ENCOUNTER — Other Ambulatory Visit: Payer: Self-pay | Admitting: Internal Medicine

## 2019-03-04 ENCOUNTER — Encounter

## 2019-03-05 ENCOUNTER — Encounter

## 2019-03-05 MED ORDER — AMLODIPINE 5 MG TAB
5 mg | ORAL_TABLET | ORAL | 0 refills | Status: AC
Start: 2019-03-05 — End: ?

## 2019-03-05 MED ORDER — VALACYCLOVIR 1 G TAB
1 gram | ORAL_TABLET | ORAL | 0 refills | Status: AC
Start: 2019-03-05 — End: ?

## 2019-03-05 MED ORDER — INDAPAMIDE 1.25 MG TAB
1.25 mg | ORAL_TABLET | ORAL | 0 refills | Status: AC
Start: 2019-03-05 — End: ?

## 2019-03-12 ENCOUNTER — Encounter

## 2019-05-02 ENCOUNTER — Encounter: Payer: BLUE CROSS/BLUE SHIELD | Admitting: Nurse Practitioner

## 2019-11-21 IMAGING — US US PELVIS COMPLETE TRANSABD/TRANSVAG
1 series · 14 of 25 positions shown · non-contrast
Comparison: None

CLINICAL DATA: Fibroids. Dysfunctional uterine bleeding. IUD check.
Lost string.



[Series 1: us pelvis complete transabd/transvag · 0.26mm/px · 61 acquisitions, 14 frames shown]
[im 1/61]
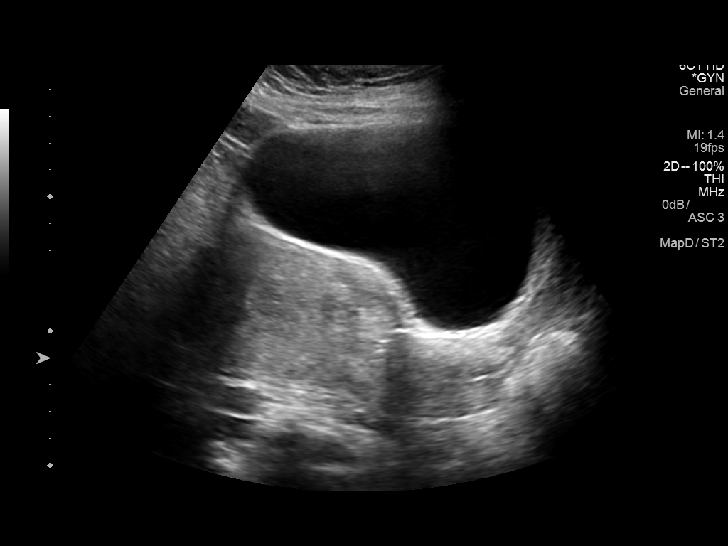
[im 6/61]
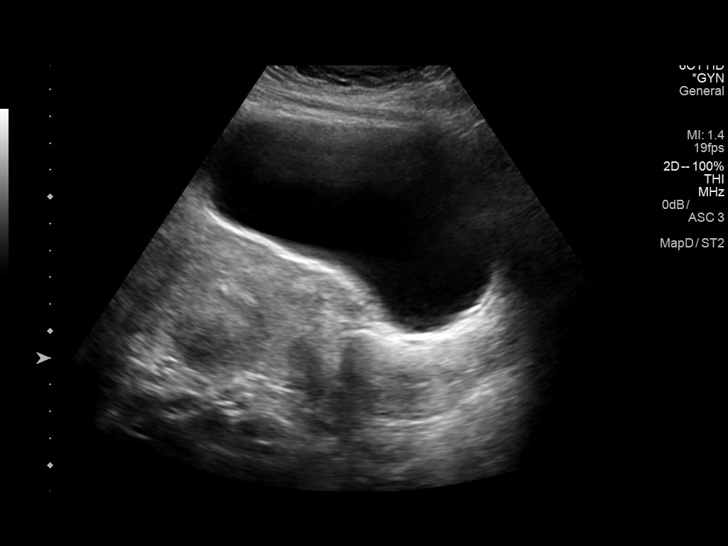
[im 11/61]
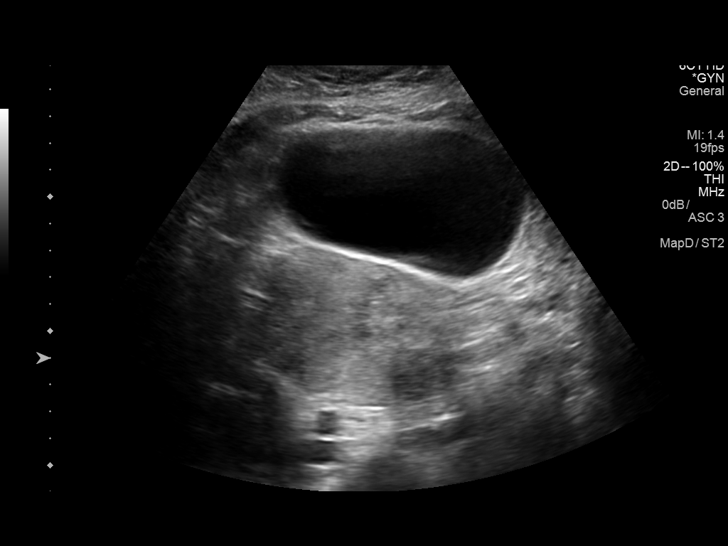
[im 16/61]
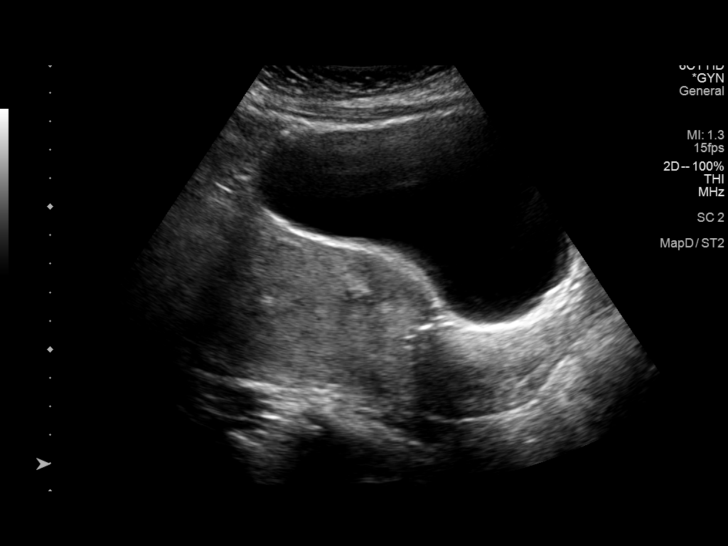
[im 21/61]
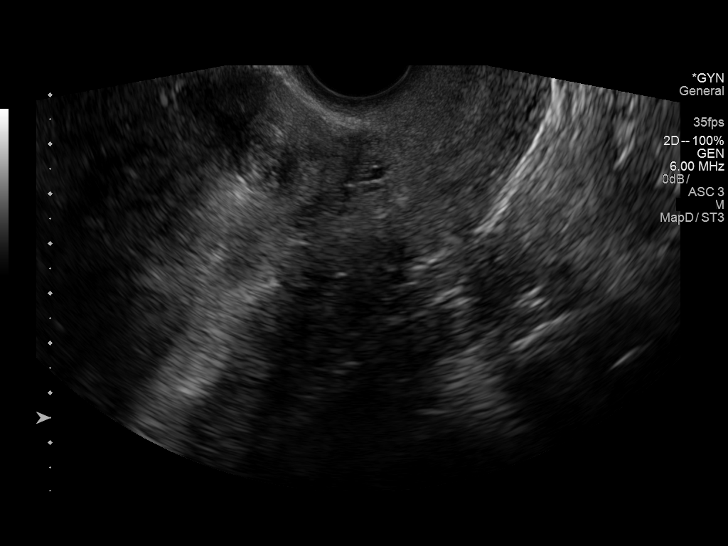
[im 23/61]
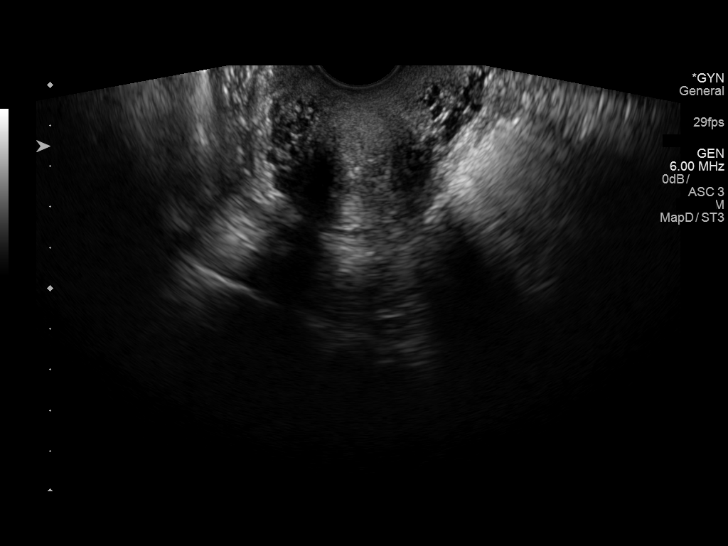
[im 28/61]
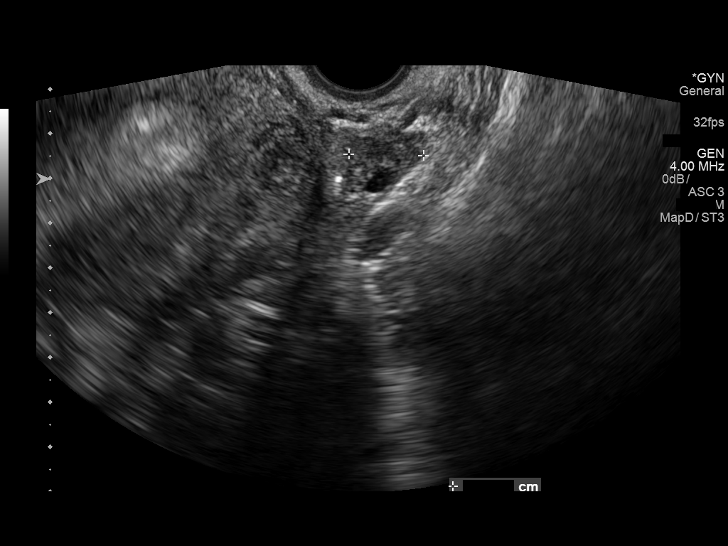
[im 33/61]
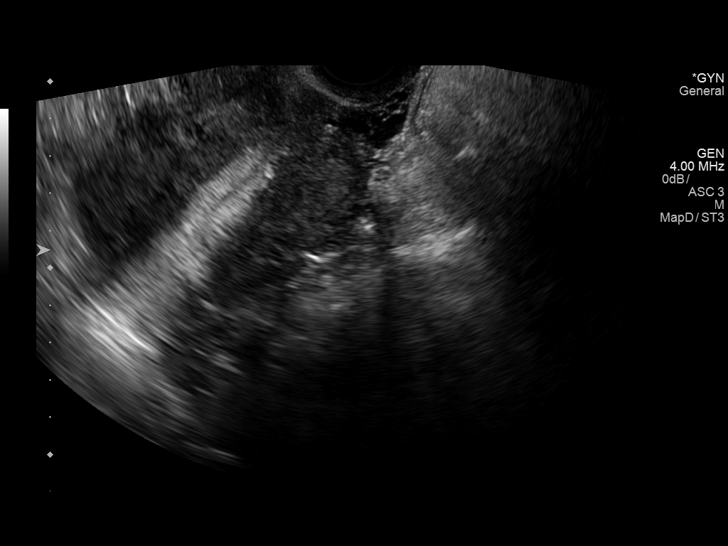
[im 38/61]
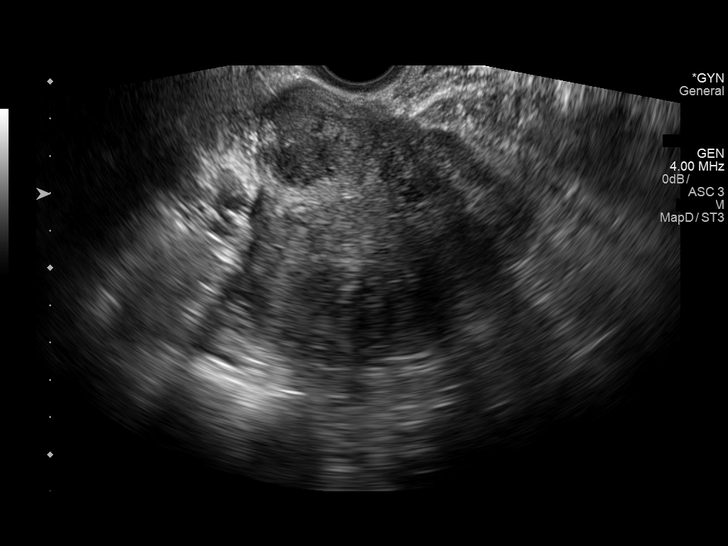
[im 41/61]
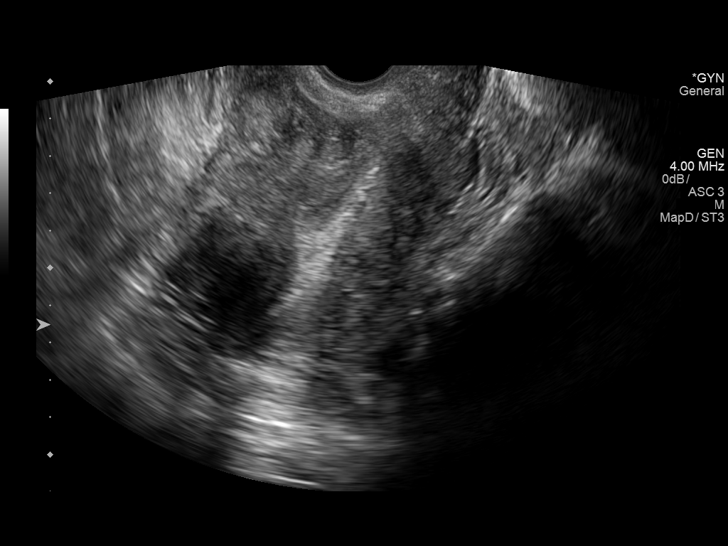
[im 46/61]
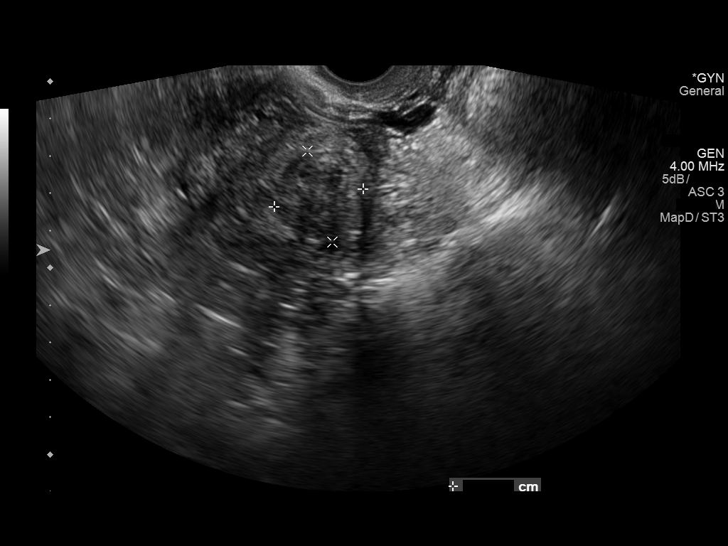
[im 51/61]
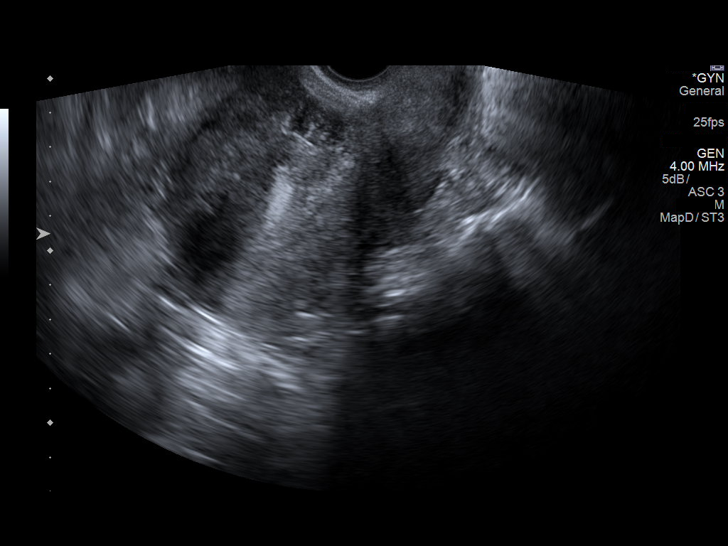
[im 56/61]
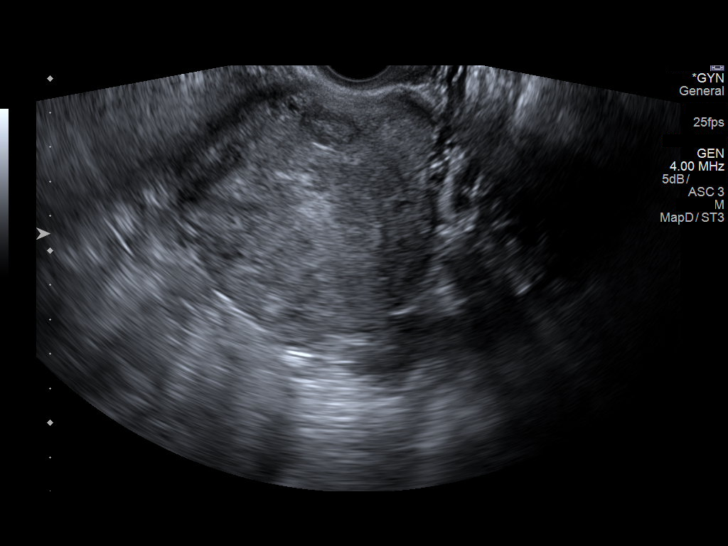
[im 61/61]
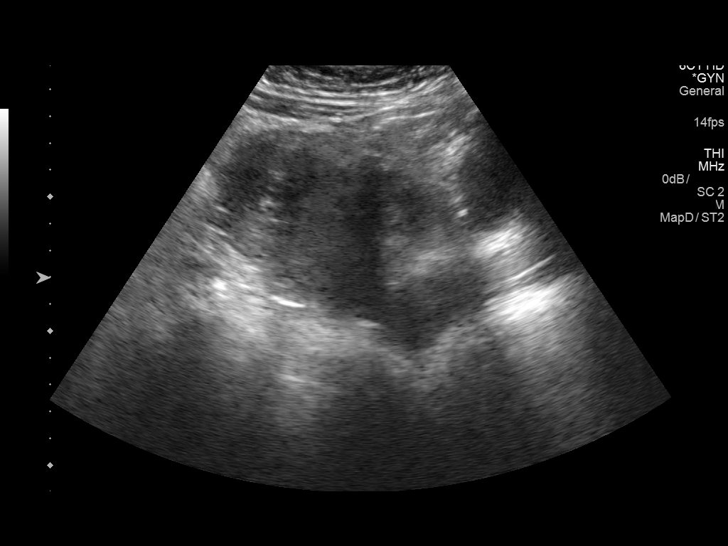

[14 of 25 positions shown; findings below may reference images not displayed]

FINDINGS: Uterus

Measurements: 11.4 x 6.0 x 8.1 cm. Multiple fibroids, 2-4 cm in
greatest diameter. These are intramural and subserosal in the
uterine fundus.

Endometrium

Thickness: 15 mm in thickness.  No IUD visualized on today's study.

Right ovary

Measurements: Not visualized.  No adnexal mass seen.

Left ovary

Measurements: 2.8 x 1.6 x 1.7 cm. Normal appearance/no adnexal mass.

Other findings

No abnormal free fluid.
IMPRESSION: Uterine fibroids as above.

Endometrium 15 mm in thickness.  No visible IUD.

## 2020-07-21 ENCOUNTER — Other Ambulatory Visit: Payer: Self-pay

## 2020-07-22 ENCOUNTER — Encounter: Payer: Self-pay | Admitting: Internal Medicine

## 2020-07-22 ENCOUNTER — Ambulatory Visit (INDEPENDENT_AMBULATORY_CARE_PROVIDER_SITE_OTHER): Payer: 59 | Admitting: Internal Medicine

## 2020-07-22 VITALS — BP 162/124 | HR 91 | Temp 98.1°F | Ht 63.0 in | Wt 156.0 lb

## 2020-07-22 DIAGNOSIS — E559 Vitamin D deficiency, unspecified: Secondary | ICD-10-CM | POA: Diagnosis not present

## 2020-07-22 DIAGNOSIS — Z1211 Encounter for screening for malignant neoplasm of colon: Secondary | ICD-10-CM | POA: Diagnosis not present

## 2020-07-22 DIAGNOSIS — I10 Essential (primary) hypertension: Secondary | ICD-10-CM | POA: Diagnosis not present

## 2020-07-22 DIAGNOSIS — M5416 Radiculopathy, lumbar region: Secondary | ICD-10-CM

## 2020-07-22 DIAGNOSIS — D509 Iron deficiency anemia, unspecified: Secondary | ICD-10-CM

## 2020-07-22 DIAGNOSIS — K429 Umbilical hernia without obstruction or gangrene: Secondary | ICD-10-CM

## 2020-07-22 DIAGNOSIS — Z0001 Encounter for general adult medical examination with abnormal findings: Secondary | ICD-10-CM | POA: Diagnosis not present

## 2020-07-22 DIAGNOSIS — Z1159 Encounter for screening for other viral diseases: Secondary | ICD-10-CM

## 2020-07-22 LAB — CBC WITH DIFFERENTIAL/PLATELET
Basophils Absolute: 0 10*3/uL (ref 0.0–0.1)
Basophils Relative: 0.4 % (ref 0.0–3.0)
Eosinophils Absolute: 0 10*3/uL (ref 0.0–0.7)
Eosinophils Relative: 0.4 % (ref 0.0–5.0)
HCT: 32 % — ABNORMAL LOW (ref 36.0–46.0)
Hemoglobin: 10.3 g/dL — ABNORMAL LOW (ref 12.0–15.0)
Lymphocytes Relative: 22.4 % (ref 12.0–46.0)
Lymphs Abs: 0.9 10*3/uL (ref 0.7–4.0)
MCHC: 32.3 g/dL (ref 30.0–36.0)
MCV: 94.1 fl (ref 78.0–100.0)
Monocytes Absolute: 0.4 10*3/uL (ref 0.1–1.0)
Monocytes Relative: 9.4 % (ref 3.0–12.0)
Neutro Abs: 2.7 10*3/uL (ref 1.4–7.7)
Neutrophils Relative %: 67.4 % (ref 43.0–77.0)
Platelets: 303 10*3/uL (ref 150.0–400.0)
RBC: 3.4 Mil/uL — ABNORMAL LOW (ref 3.87–5.11)
RDW: 21 % — ABNORMAL HIGH (ref 11.5–15.5)
WBC: 4 10*3/uL (ref 4.0–10.5)

## 2020-07-22 LAB — IBC PANEL
Iron: 382 ug/dL — ABNORMAL HIGH (ref 42–145)
Saturation Ratios: 93.8 % — ABNORMAL HIGH (ref 20.0–50.0)
Transferrin: 291 mg/dL (ref 212.0–360.0)

## 2020-07-22 LAB — HEPATIC FUNCTION PANEL
ALT: 22 U/L (ref 0–35)
AST: 21 U/L (ref 0–37)
Albumin: 4.1 g/dL (ref 3.5–5.2)
Alkaline Phosphatase: 45 U/L (ref 39–117)
Bilirubin, Direct: 0 mg/dL (ref 0.0–0.3)
Total Bilirubin: 0.3 mg/dL (ref 0.2–1.2)
Total Protein: 7.1 g/dL (ref 6.0–8.3)

## 2020-07-22 LAB — BASIC METABOLIC PANEL WITH GFR
BUN: 10 mg/dL (ref 6–23)
CO2: 29 meq/L (ref 19–32)
Calcium: 9.8 mg/dL (ref 8.4–10.5)
Chloride: 104 meq/L (ref 96–112)
Creatinine, Ser: 0.73 mg/dL (ref 0.40–1.20)
GFR: 96.73 mL/min
Glucose, Bld: 88 mg/dL (ref 70–99)
Potassium: 4 meq/L (ref 3.5–5.1)
Sodium: 138 meq/L (ref 135–145)

## 2020-07-22 LAB — LIPID PANEL
Cholesterol: 188 mg/dL (ref 0–200)
HDL: 63.3 mg/dL
LDL Cholesterol: 107 mg/dL — ABNORMAL HIGH (ref 0–99)
NonHDL: 124.73
Total CHOL/HDL Ratio: 3
Triglycerides: 87 mg/dL (ref 0.0–149.0)
VLDL: 17.4 mg/dL (ref 0.0–40.0)

## 2020-07-22 LAB — URINALYSIS, ROUTINE W REFLEX MICROSCOPIC
Bilirubin Urine: NEGATIVE
Hgb urine dipstick: NEGATIVE
Ketones, ur: NEGATIVE
Leukocytes,Ua: NEGATIVE
Nitrite: NEGATIVE
RBC / HPF: NONE SEEN
Specific Gravity, Urine: >=1.03 — AB (ref 1.000–1.030)
Total Protein, Urine: NEGATIVE
Urine Glucose: NEGATIVE
Urobilinogen, UA: 0.2 (ref 0.0–1.0)
pH: 5.5 (ref 5.0–8.0)

## 2020-07-22 LAB — TSH: TSH: 2.72 u[IU]/mL (ref 0.35–4.50)

## 2020-07-22 LAB — FERRITIN: Ferritin: 21.8 ng/mL (ref 10.0–291.0)

## 2020-07-22 LAB — VITAMIN D 25 HYDROXY (VIT D DEFICIENCY, FRACTURES): VITD: 26.89 ng/mL — ABNORMAL LOW (ref 30.00–100.00)

## 2020-07-22 MED ORDER — THERA-D 2000 50 MCG (2000 UT) PO TABS: ORAL_TABLET | ORAL | 99 refills | Status: AC

## 2020-07-22 MED ORDER — AMLODIPINE BESYLATE 5 MG PO TABS: 5.0000 mg | ORAL_TABLET | Freq: Every day | ORAL | 3 refills | Status: DC

## 2020-07-22 NOTE — Progress Notes (Signed)
Patient ID: Ruth Schmidt, female   DOB: 1971-02-29, 50 y.o.   MRN: [id number]         Chief Complaint:: wellness exam and Annual Exam (Patient c/o having pain radiating down left leg with numbness that comes and goes. Patient also c/o having a "warm sensation" that runs down right leg x2 days)  , and mid abd knot that seems to come and go       HPI:  Ruth Schmidt is a 50 y.o. female here for wellness exam; plans to call for GYN soon.  Declines covid booster. Due for colonoscopy, and hep c screen.  O/w up to date with preventive referrals and immunizations.                          Also Working close to 10 -12 hr days most day for 3 part time jobs. Also c/o 1 mo intermittent knot swelling to the mid abdomen just near the umbilicus, mild to mod pain at times, but Denies worsening reflux, abd pain, dysphagia, n/v, bowel change or blood.  Pt denies chest pain, increased sob or doe, wheezing, orthopnea, PND, increased LE swelling, palpitations, dizziness or syncope but has been out of BP meds for over 1 wk.  Taking the Vit d, and iron pill.   Pt denies polydipsia, polyuria, or new focal neuro s/s, but has 3 mo worsening freq and severity left sciatia as well as now mild weakness, as well as now mild pain warm sensation to the right upper leg to just below the knee x 2 day  No gait change or falls.   Pt denies fever, wt loss, night sweats, loss of appetite, or other constitutional symptoms  No other new complaints  Wt Readings from Last 3 Encounters:  07/22/20 156 lb (70.8 kg)  09/20/18 153 lb (69.4 kg)  04/30/18 152 lb (68.9 kg)   BP Readings from Last 3 Encounters:  07/22/20 (!) 162/124  09/20/18 126/84  04/30/18 132/82   Immunization History  Administered Date(s) Administered  . PFIZER(Purple Top)SARS-COV-2 Vaccination 05/24/2019, 06/14/2019  . Tdap 03/21/2006, 06/28/2017   Health Maintenance Due  Topic Date Due  . PAP SMEAR-Modifier  07/12/2020    Past Medical History:  Diagnosis  Date  . Abnormal Pap smear of cervix   . Hypertension    Past Surgical History:  Procedure Laterality Date  . CESAREAN SECTION    . TUBAL LIGATION Bilateral 2005    reports that she has never smoked. She has never used smokeless tobacco. She reports current alcohol use. She reports that she does not use drugs. family history includes Breast cancer (age of onset: 40) in her maternal aunt; Cancer in her unknown relative; Colon cancer in her maternal uncle; Coronary artery disease (age of onset: 46) in her maternal grandfather; Diabetes in her father and paternal grandmother; Hyperlipidemia in her father; Hypertension in her maternal grandmother and mother; Lung cancer in her maternal uncle; Stroke (age of onset: 44) in her maternal aunt. No Known Allergies No current outpatient medications on file prior to visit.   No current facility-administered medications on file prior to visit.        ROS:  All others reviewed and negative.  Objective        PE:  BP (!) 162/124 (BP Location: Left Arm, Patient Position: Sitting, Cuff Size: Large)   Pulse 91   Temp 98.1 F (36.7 C) (Oral)   Ht 5\' 3"  (1.6  m)   Wt 156 lb (70.8 kg)   SpO2 100%   BMI 27.63 kg/m                 Constitutional: Pt appears in NAD               HENT: Head: NCAT.                Right Ear: External ear normal.                 Left Ear: External ear normal.                Eyes: . Pupils are equal, round, and reactive to light. Conjunctivae and EOM are normal               Nose: without d/c or deformity               Neck: Neck supple. Gross normal ROM               Cardiovascular: Normal rate and regular rhythm.                 Pulmonary/Chest: Effort normal and breath sounds without rales or wheezing.                Abd:  Soft, NT, ND, + BS, no organomegaly, no hernia seen at this time               Neurological: Pt is alert. At baseline orientation, motor grossly intact               Skin: Skin is warm. No rashes, no  other new lesions, LE edema - none                Psychiatric: Pt behavior is normal without agitation   Micro: none  Cardiac tracings I have personally interpreted today:  none  Pertinent Radiological findings (summarize): none   Lab Results  Component Value Date   WBC 4.0 07/22/2020   HGB 10.3 (L) 07/22/2020   HCT 32.0 (L) 07/22/2020   PLT 303.0 07/22/2020   GLUCOSE 88 07/22/2020   CHOL 188 07/22/2020   TRIG 87.0 07/22/2020   HDL 63.30 07/22/2020   LDLDIRECT 121.7 01/04/2013   LDLCALC 107 (H) 07/22/2020   ALT 22 07/22/2020   AST 21 07/22/2020   NA 138 07/22/2020   K 4.0 07/22/2020   CL 104 07/22/2020   CREATININE 0.73 07/22/2020   BUN 10 07/22/2020   CO2 29 07/22/2020   TSH 2.72 07/22/2020   HGBA1C 5.3 10/27/2017   Assessment/Plan:  Ruth Schmidt is a 50 y.o. Black or African American [2] female with  has a past medical history of Abnormal Pap smear of cervix and Hypertension.  Encounter for general adult medical examination with abnormal findings Age and sex appropriate education and counseling updated with regular exercise and diet Referrals for preventative services - for GYN - pt to call, also refer colonoscopy and hep c screen Immunizations addressed - for covid booster soon per pt Smoking counseling  - none needed Evidence for depression or other mood disorder - none significant Most recent labs reviewed. I have personally reviewed and have noted: 1) the patient's medical and social history 2) The patient's current medications and supplements 3) The patient's height, weight, and BMI have been recorded in the chart   Vitamin D deficiency Last vitamin D Lab Results  Component Value Date   VD25OH  26.89 (L) 07/22/2020   Low, to start oral replacement  Periumbilical hernia Mild symptomatic, exam benign today, for general surgury referral  Left lumbar radiculopathy With recent 3 mo persistent now worsening  - for MRI ls spine  Hypertension,  uncontrolled BP Readings from Last 3 Encounters:  07/22/20 (!) 162/124  09/20/18 126/84  04/30/18 132/82   Stable, pt to restart tx - amlodipine 5   Current Outpatient Medications (Cardiovascular):  .  amLODipine (NORVASC) 5 MG tablet, Take 1 tablet (5 mg total) by mouth daily.     Current Outpatient Medications (Other):  Marland Kitchen  Cholecalciferol (THERA-D 2000) 50 MCG (2000 UT) TABS, 1 tab by mouth once daily   Anemia, iron deficiency Also for lab f/u,,  to f/u any worsening symptoms or concerns  Followup: Return in about 4 weeks (around 08/19/2020).  Oliver Barre, MD 07/25/2020 10:23 PM [facility]

## 2020-07-22 NOTE — Patient Instructions (Signed)
Please remember to call for your yearly GYN exam, and consider having your COVID booster done  Please continue all other medications as before, including to restart the BP medicine  Please continue to take OTC Vitamin D3 at 2000 units per day, indefinitely, as well as continue the iron pill  Please have the pharmacy call with any other refills you may need.  Please continue your efforts at being more active, low cholesterol diet, and weight control.  You are otherwise up to date with prevention measures today.  Please keep your appointments with your specialists as you may have planned  You will be contacted regarding the referral for: General Surgury for the abd wall knot (hernia), and MRI for the lower back  Remember if the MRI is negative, we would want to have you to see Neurology for the symptoms in your legs  Please go to the LAB at the blood drawing area for the tests to be done  You will be contacted by phone if any changes need to be made immediately.  Otherwise, you will receive a letter about your results with an explanation, but please check with MyChart first.  Please remember to sign up for MyChart if you have not done so, as this will be important to you in the future with finding out test results, communicating by private email, and scheduling acute appointments online when needed.  Please make an Appointment to return in 1 month to recheck the BP and other progress

## 2020-07-23 LAB — HEPATITIS C ANTIBODY
Hepatitis C Ab: NONREACTIVE
SIGNAL TO CUT-OFF: 0.02 (ref <1.00)

## 2020-07-25 ENCOUNTER — Encounter: Payer: Self-pay | Admitting: Internal Medicine

## 2020-07-25 DIAGNOSIS — M5416 Radiculopathy, lumbar region: Secondary | ICD-10-CM | POA: Insufficient documentation

## 2020-07-25 NOTE — Assessment & Plan Note (Signed)
Age and sex appropriate education and counseling updated with regular exercise and diet Referrals for preventative services - for GYN - pt to call, also refer colonoscopy and hep c screen Immunizations addressed - for covid booster soon per pt Smoking counseling  - none needed Evidence for depression or other mood disorder - none significant Most recent labs reviewed. I have personally reviewed and have noted: 1) the patient's medical and social history 2) The patient's current medications and supplements 3) The patient's height, weight, and BMI have been recorded in the chart

## 2020-07-25 NOTE — Assessment & Plan Note (Signed)
Also for lab f/u,,  to f/u any worsening symptoms or concerns

## 2020-07-25 NOTE — Assessment & Plan Note (Signed)
Last vitamin D Lab Results  Component Value Date   VD25OH 26.89 (L) 07/22/2020   Low, to start oral replacement

## 2020-07-25 NOTE — Assessment & Plan Note (Signed)
With recent 3 mo persistent now worsening  - for MRI ls spine

## 2020-07-25 NOTE — Assessment & Plan Note (Signed)
BP Readings from Last 3 Encounters:  07/22/20 (!) 162/124  09/20/18 126/84  04/30/18 132/82   Stable, pt to restart tx - amlodipine 5   Current Outpatient Medications (Cardiovascular):  .  amLODipine (NORVASC) 5 MG tablet, Take 1 tablet (5 mg total) by mouth daily.     Current Outpatient Medications (Other):  Marland Kitchen  Cholecalciferol (THERA-D 2000) 50 MCG (2000 UT) TABS, 1 tab by mouth once daily

## 2020-07-25 NOTE — Assessment & Plan Note (Signed)
Mild symptomatic, exam benign today, for general surgury referral

## 2020-07-30 ENCOUNTER — Other Ambulatory Visit: Payer: Self-pay | Admitting: Internal Medicine

## 2020-07-30 ENCOUNTER — Ambulatory Visit
Admission: RE | Admit: 2020-07-30 | Discharge: 2020-07-30 | Disposition: A | Discharge status: Home / Self Care | Payer: 59 | Source: Ambulatory Visit | Attending: Internal Medicine | Admitting: Internal Medicine

## 2020-07-30 ENCOUNTER — Other Ambulatory Visit: Payer: Self-pay

## 2020-07-30 ENCOUNTER — Encounter: Payer: Self-pay | Admitting: Internal Medicine

## 2020-07-30 DIAGNOSIS — M5416 Radiculopathy, lumbar region: Secondary | ICD-10-CM

## 2020-08-03 ENCOUNTER — Other Ambulatory Visit: Payer: 59

## 2020-08-24 ENCOUNTER — Other Ambulatory Visit: Payer: Self-pay

## 2020-08-24 ENCOUNTER — Ambulatory Visit (INDEPENDENT_AMBULATORY_CARE_PROVIDER_SITE_OTHER): Payer: 59 | Admitting: Internal Medicine

## 2020-08-24 ENCOUNTER — Encounter: Payer: Self-pay | Admitting: Internal Medicine

## 2020-08-24 VITALS — BP 142/86 | HR 98 | Temp 98.2°F | Ht 63.0 in | Wt 152.0 lb

## 2020-08-24 DIAGNOSIS — N920 Excessive and frequent menstruation with regular cycle: Secondary | ICD-10-CM

## 2020-08-24 DIAGNOSIS — K429 Umbilical hernia without obstruction or gangrene: Secondary | ICD-10-CM

## 2020-08-24 DIAGNOSIS — D509 Iron deficiency anemia, unspecified: Secondary | ICD-10-CM

## 2020-08-24 DIAGNOSIS — I10 Essential (primary) hypertension: Secondary | ICD-10-CM

## 2020-08-24 DIAGNOSIS — M5416 Radiculopathy, lumbar region: Secondary | ICD-10-CM | POA: Diagnosis not present

## 2020-08-24 DIAGNOSIS — E559 Vitamin D deficiency, unspecified: Secondary | ICD-10-CM | POA: Diagnosis not present

## 2020-08-24 MED ORDER — GABAPENTIN 100 MG PO CAPS: 100.0000 mg | ORAL_CAPSULE | Freq: Three times a day (TID) | ORAL | 5 refills | Status: DC

## 2020-08-24 MED ORDER — HYDROCHLOROTHIAZIDE 12.5 MG PO CAPS: 12.5000 mg | ORAL_CAPSULE | Freq: Every day | ORAL | 3 refills | Status: DC

## 2020-08-24 NOTE — Assessment & Plan Note (Addendum)
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

## 2020-08-24 NOTE — Assessment & Plan Note (Signed)
Much improved, now iron increased with last lab Lab Results  Component Value Date   IRON 382 (H) 07/22/2020   FERRITIN 21.8 07/22/2020   Ok to decrease daily iron to 3 times weekly

## 2020-08-24 NOTE — Assessment & Plan Note (Addendum)
Has appt to general surgury aprox 1 wk, remains mild symptomatic intermittent

## 2020-08-24 NOTE — Assessment & Plan Note (Signed)
Improved but still mild elevated over goal < 140/90  - for add hct 12.5 qd, cont amlodipine, f/u bp at home and next visit

## 2020-08-24 NOTE — Assessment & Plan Note (Signed)
1 yr worsening gradual, pt to f/u with GYn as planned

## 2020-08-24 NOTE — Assessment & Plan Note (Signed)
Last vitamin D Lab Results  Component Value Date   VD25OH 26.89 (L) 07/22/2020   Remains low despite current oral replacement, pt not sure of units, but ok to double this and let us know next visit

## 2020-08-24 NOTE — Patient Instructions (Signed)
Please take all new medication as prescribed - the hct 12.5 mg per day (for blood pressure and swelling)  Ok to cut back on the iron to 3 times weekly  Please see your GYN for the worsening menses bleeding though  Please take all new medication as prescribed - the gabapentin 100 mg three times per day for the nerve pain, though you can go to 200 or even 300 after 1-2 weeks if it doesn't make you sleepy  Please call if you do not hear about the [facility] referral soon  OK to Double the current Vitamin D that you take - for example, if you take 2000 units, you should double to 4000 units per day (this is not harmful)  Please continue all other medications as before, and refills have been done if requested.  Please have the pharmacy call with any other refills you may need.  Please continue your efforts at being more active, low cholesterol diet, and weight control.  Please keep your appointments with your specialists as you may have planned - [facility] for the hernia next week  Please make an Appointment to return in 6 months, or sooner if needed

## 2020-08-24 NOTE — Progress Notes (Signed)
Patient ID: Ruth Schmidt, female   DOB: [birth year], 50 y.o.   MRN: [id number]        Chief Complaint: follow up htn, mild leg swelling, left lumbar radicular pain, low vit d, and iron deficiency       HPI:  Ruth Schmidt is a 50 y.o. female here with above, overall doing ok but BP at home and here are still mild elevated over normal, and has intermittent mild feet and leg swelling occasionally worse in the PM, better in the am after lying down, but Pt denies chest pain, increased sob or doe, wheezing, orthopnea, PND,  palpitations, dizziness or syncope.  Pt denies polydipsia, polyuria, or new focal neuro s/s, except for ongoing and now worsening left lower back and leg pain burning, mod to severe without left leg weakness, gait worsening or falls.  Pt is taking Vit D, just not sure what her unit dose is daily.  Also has had increase menorrhagia in the past yr, especially the first 2 days of her menses, has been taking her iron supplement well without constipation or other side effect,b ut has yet to make appt to f/u GYN.        Wt Readings from Last 3 Encounters:  08/24/20 152 lb (68.9 kg)  07/22/20 156 lb (70.8 kg)  09/20/18 153 lb (69.4 kg)   BP Readings from Last 3 Encounters:  08/24/20 (!) 142/86  07/22/20 (!) 162/124  09/20/18 126/84         Past Medical History:  Diagnosis Date  . Abnormal Pap smear of cervix   . Hypertension    Past Surgical History:  Procedure Laterality Date  . CESAREAN SECTION    . TUBAL LIGATION Bilateral 2005    reports that she has never smoked. She has never used smokeless tobacco. She reports current alcohol use. She reports that she does not use drugs. family history includes Breast cancer (age of onset: 67) in her maternal aunt; Cancer in her unknown relative; Colon cancer in her maternal uncle; Coronary artery disease (age of onset: 102) in her maternal grandfather; Diabetes in her father and paternal grandmother; Hyperlipidemia in her father;  Hypertension in her maternal grandmother and mother; Lung cancer in her maternal uncle; Stroke (age of onset: 59) in her maternal aunt. No Known Allergies Current Outpatient Medications on File Prior to Visit  Medication Sig Dispense Refill  . amLODipine (NORVASC) 5 MG tablet Take 1 tablet (5 mg total) by mouth daily. 90 tablet 3  . Cholecalciferol (THERA-D 2000) 50 MCG (2000 UT) TABS 1 tab by mouth once daily 30 tablet 99   No current facility-administered medications on file prior to visit.        ROS:  All others reviewed and negative.  Objective        PE:  BP (!) 142/86 (BP Location: Left Arm, Patient Position: Sitting, Cuff Size: Normal)   Pulse 98   Temp 98.2 F (36.8 C) (Oral)   Ht 5\' 3"  (1.6 m)   Wt 152 lb (68.9 kg)   SpO2 99%   BMI 26.93 kg/m                 Constitutional: Pt appears in NAD               HENT: Head: NCAT.                Right Ear: External ear normal.  Left Ear: External ear normal.                Eyes: . Pupils are equal, round, and reactive to light. Conjunctivae and EOM are normal               Nose: without d/c or deformity               Neck: Neck supple. Gross normal ROM               Cardiovascular: Normal rate and regular rhythm.                 Pulmonary/Chest: Effort normal and breath sounds without rales or wheezing.                Abd:  Soft, NT, ND, + BS, no organomegaly               Neurological: Pt is alert. At baseline orientation, motor grossly intact               Skin: Skin is warm. No rashes, no other new lesions, LE edema - trace bilateral pedal               Psychiatric: Pt behavior is normal without agitation   Micro: none  Cardiac tracings I have personally interpreted today:  none  Pertinent Radiological findings (summarize): Jul 30 2020 MRI ls spine IMPRESSION: 1. 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    Lab Results  Component Value Date   WBC 4.0 07/22/2020   HGB 10.3 (L) 07/22/2020   HCT 32.0 (L) 07/22/2020   PLT 303.0 07/22/2020   GLUCOSE 88 07/22/2020   CHOL 188 07/22/2020   TRIG 87.0 07/22/2020   HDL 63.30 07/22/2020   LDLDIRECT 121.7 01/04/2013   LDLCALC 107 (H) 07/22/2020   ALT 22 07/22/2020   AST 21 07/22/2020   NA 138 07/22/2020   K 4.0 07/22/2020   CL 104 07/22/2020   CREATININE 0.73 07/22/2020   BUN 10 07/22/2020   CO2 29 07/22/2020   TSH 2.72 07/22/2020   HGBA1C 5.3 10/27/2017   Assessment/Plan:  Ruth Schmidt is a 50 y.o. Black or African American [2] female with  has a past medical history of Abnormal Pap smear of cervix and Hypertension.  Hypertension, uncontrolled Improved but still mild elevated over goal < 140/90  - for add hct 12.5 qd, cont amlodipine, f/u bp at home and next visit  Anemia, iron deficiency Much improved, now iron increased with last lab Lab Results  Component Value Date   IRON 382 (H) 07/22/2020   FERRITIN 21.8 07/22/2020   Ok to decrease daily iron to 3 times weekly  Vitamin D deficiency Last vitamin D Lab Results  Component Value Date   VD25OH 26.89 (L) 07/22/2020   Remains low despite current oral replacement, pt not sure of units, but ok to double this and let us know next visit  Periumbilical hernia Has appt to general surgury aprox 1 wk, remains mild symptomatic intermittent  Left lumbar radiculopathy With 1 wk worsening pain, but no weakness or falls, most recent 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  Oliver Barre, MD 08/24/2020 1:14 PM [facility]

## 2020-10-12 ENCOUNTER — Ambulatory Visit: Payer: Self-pay | Admitting: General Surgery

## 2020-10-12 ENCOUNTER — Encounter (HOSPITAL_BASED_OUTPATIENT_CLINIC_OR_DEPARTMENT_OTHER): Payer: Self-pay | Admitting: General Surgery

## 2020-10-12 ENCOUNTER — Other Ambulatory Visit: Payer: Self-pay

## 2020-10-14 ENCOUNTER — Encounter (HOSPITAL_BASED_OUTPATIENT_CLINIC_OR_DEPARTMENT_OTHER)
Admission: RE | Admit: 2020-10-14 | Discharge: 2020-10-14 | Disposition: A | Discharge status: Home / Self Care | Payer: 59 | Source: Ambulatory Visit | Attending: General Surgery | Admitting: General Surgery

## 2020-10-14 DIAGNOSIS — Z01818 Encounter for other preprocedural examination: Secondary | ICD-10-CM | POA: Diagnosis present

## 2020-10-14 LAB — BASIC METABOLIC PANEL
Anion gap: 7 (ref 5–15)
BUN: 11 mg/dL (ref 6–20)
CO2: 27 mmol/L (ref 22–32)
Calcium: 10.1 mg/dL (ref 8.9–10.3)
Chloride: 102 mmol/L (ref 98–111)
Creatinine, Ser: 0.7 mg/dL (ref 0.44–1.00)
GFR, Estimated: >60 mL/min (ref >60)
Glucose, Bld: 94 mg/dL (ref 70–99)
Potassium: 3.8 mmol/L (ref 3.5–5.1)
Sodium: 136 mmol/L (ref 135–145)

## 2020-10-14 MED ORDER — CHLORHEXIDINE GLUCONATE CLOTH 2 % EX PADS
6.0000 | MEDICATED_PAD | Freq: Once | CUTANEOUS | Status: DC
Start: 2020-10-14 — End: 2020-10-19

## 2020-10-14 MED ORDER — CHLORHEXIDINE GLUCONATE CLOTH 2 % EX PADS: 6.0000 | MEDICATED_PAD | Freq: Once | CUTANEOUS | Status: DC

## 2020-10-18 NOTE — Anesthesia Preprocedure Evaluation (Addendum)
Anesthesia Evaluation  Patient identified by MRN, date of birth, ID band Patient awake    Reviewed: Allergy & Precautions, NPO status , Patient's Chart, lab work & pertinent test results  Airway Mallampati: II  TM Distance: >3 FB Neck ROM: Full    Dental no notable dental hx. (+) Teeth Intact, Dental Advisory Given   Pulmonary neg pulmonary ROS,    Pulmonary exam normal breath sounds clear to auscultation       Cardiovascular hypertension (BP 157/97- per pt normally this high, took BP meds thi AM), Pt. on medications Normal cardiovascular exam Rhythm:Regular Rate:Normal     Neuro/Psych negative neurological ROS  negative psych ROS   GI/Hepatic negative GI ROS, Neg liver ROS,   Endo/Other  negative endocrine ROS  Renal/GU negative Renal ROS  negative genitourinary   Musculoskeletal negative musculoskeletal ROS (+)   Abdominal   Peds  Hematology negative hematology ROS (+)   Anesthesia Other Findings Umbilical hernia   Reproductive/Obstetrics negative OB ROS                            Anesthesia Physical Anesthesia Plan  ASA: 2  Anesthesia Plan: General   Post-op Pain Management:    Induction: Intravenous  PONV Risk Score and Plan: 4 or greater and Ondansetron, Dexamethasone, Midazolam, Scopolamine patch - Pre-op and Treatment may vary due to age or medical condition  Airway Management Planned: Oral ETT  Additional Equipment:   Intra-op Plan:   Post-operative Plan: Extubation in OR  Informed Consent: I have reviewed the patients History and Physical, chart, labs and discussed the procedure including the risks, benefits and alternatives for the proposed anesthesia with the patient or authorized representative who has indicated his/her understanding and acceptance.     Dental advisory given  Plan Discussed with: CRNA  Anesthesia Plan Comments:        Anesthesia Quick  Evaluation

## 2020-10-19 ENCOUNTER — Encounter (HOSPITAL_BASED_OUTPATIENT_CLINIC_OR_DEPARTMENT_OTHER): Payer: Self-pay | Admitting: General Surgery

## 2020-10-19 ENCOUNTER — Ambulatory Visit (HOSPITAL_BASED_OUTPATIENT_CLINIC_OR_DEPARTMENT_OTHER)
Admission: RE | Admit: 2020-10-19 | Discharge: 2020-10-19 | Disposition: A | Discharge status: Home / Self Care | Payer: 59 | Attending: General Surgery | Admitting: General Surgery

## 2020-10-19 ENCOUNTER — Ambulatory Visit (HOSPITAL_BASED_OUTPATIENT_CLINIC_OR_DEPARTMENT_OTHER): Payer: 59 | Admitting: Anesthesiology

## 2020-10-19 ENCOUNTER — Encounter (HOSPITAL_BASED_OUTPATIENT_CLINIC_OR_DEPARTMENT_OTHER)
Admission: RE | Disposition: A | Discharge status: Home / Self Care | Payer: Self-pay | Source: Home / Self Care | Attending: General Surgery

## 2020-10-19 ENCOUNTER — Other Ambulatory Visit: Payer: Self-pay

## 2020-10-19 DIAGNOSIS — K5909 Other constipation: Secondary | ICD-10-CM | POA: Diagnosis not present

## 2020-10-19 DIAGNOSIS — Z8249 Family history of ischemic heart disease and other diseases of the circulatory system: Secondary | ICD-10-CM | POA: Diagnosis not present

## 2020-10-19 DIAGNOSIS — Z79899 Other long term (current) drug therapy: Secondary | ICD-10-CM | POA: Diagnosis not present

## 2020-10-19 DIAGNOSIS — Z8042 Family history of malignant neoplasm of prostate: Secondary | ICD-10-CM | POA: Diagnosis not present

## 2020-10-19 DIAGNOSIS — K439 Ventral hernia without obstruction or gangrene: Secondary | ICD-10-CM | POA: Insufficient documentation

## 2020-10-19 DIAGNOSIS — K429 Umbilical hernia without obstruction or gangrene: Secondary | ICD-10-CM | POA: Insufficient documentation

## 2020-10-19 HISTORY — PX: UMBILICAL HERNIA REPAIR: SHX196

## 2020-10-19 HISTORY — DX: Other intervertebral disc displacement, lumbar region: M51.26

## 2020-10-19 LAB — POCT PREGNANCY, URINE: Preg Test, Ur: NEGATIVE

## 2020-10-19 SURGERY — REPAIR, HERNIA, UMBILICAL, ADULT
Anesthesia: General | Site: Abdomen

## 2020-10-19 MED ORDER — ACETAMINOPHEN 500 MG PO TABS
ORAL_TABLET | ORAL | Status: AC
  Filled 2020-10-19: qty 2

## 2020-10-19 MED ORDER — MEPERIDINE HCL 25 MG/ML IJ SOLN: 6.2500 mg | INTRAMUSCULAR | Status: DC | PRN

## 2020-10-19 MED ORDER — GABAPENTIN 300 MG PO CAPS
ORAL_CAPSULE | ORAL | Status: AC
  Filled 2020-10-19: qty 1

## 2020-10-19 MED ORDER — LIDOCAINE HCL (CARDIAC) PF 100 MG/5ML IV SOSY
PREFILLED_SYRINGE | INTRAVENOUS | Status: DC | PRN
  Administered 2020-10-19: 60 mg via INTRAVENOUS

## 2020-10-19 MED ORDER — CEFAZOLIN SODIUM-DEXTROSE 2-4 GM/100ML-% IV SOLN
INTRAVENOUS | Status: AC
  Filled 2020-10-19: qty 100

## 2020-10-19 MED ORDER — ONDANSETRON HCL 4 MG/2ML IJ SOLN
INTRAMUSCULAR | Status: AC
  Filled 2020-10-19: qty 2

## 2020-10-19 MED ORDER — SUGAMMADEX SODIUM 200 MG/2ML IV SOLN
INTRAVENOUS | Status: DC | PRN
  Administered 2020-10-19: 150 mg via INTRAVENOUS

## 2020-10-19 MED ORDER — HYDROMORPHONE HCL 1 MG/ML IJ SOLN
0.2500 mg | INTRAMUSCULAR | Status: DC | PRN
  Administered 2020-10-19 (×2): 0.25 mg via INTRAVENOUS

## 2020-10-19 MED ORDER — DEXAMETHASONE SODIUM PHOSPHATE 4 MG/ML IJ SOLN
INTRAMUSCULAR | Status: DC | PRN
  Administered 2020-10-19: 5 mg via INTRAVENOUS

## 2020-10-19 MED ORDER — PROPOFOL 10 MG/ML IV BOLUS
INTRAVENOUS | Status: AC
  Filled 2020-10-19: qty 20

## 2020-10-19 MED ORDER — OXYCODONE HCL 5 MG/5ML PO SOLN: 5.0000 mg | Freq: Once | ORAL | Status: AC | PRN

## 2020-10-19 MED ORDER — GABAPENTIN 300 MG PO CAPS
300.0000 mg | ORAL_CAPSULE | ORAL | Status: AC
  Administered 2020-10-19: 300 mg via ORAL

## 2020-10-19 MED ORDER — ROCURONIUM BROMIDE 100 MG/10ML IV SOLN
INTRAVENOUS | Status: DC | PRN
  Administered 2020-10-19: 50 mg via INTRAVENOUS

## 2020-10-19 MED ORDER — HYDROCODONE-ACETAMINOPHEN 5-325 MG PO TABS: 1.0000 | ORAL_TABLET | Freq: Four times a day (QID) | ORAL | 0 refills | Status: DC | PRN

## 2020-10-19 MED ORDER — DEXAMETHASONE SODIUM PHOSPHATE 10 MG/ML IJ SOLN
INTRAMUSCULAR | Status: AC
  Filled 2020-10-19: qty 1

## 2020-10-19 MED ORDER — KETOROLAC TROMETHAMINE 30 MG/ML IJ SOLN: 30.0000 mg | Freq: Once | INTRAMUSCULAR | Status: DC | PRN

## 2020-10-19 MED ORDER — FENTANYL CITRATE (PF) 100 MCG/2ML IJ SOLN
INTRAMUSCULAR | Status: DC | PRN
  Administered 2020-10-19: 100 ug via INTRAVENOUS
  Administered 2020-10-19: 50 ug via INTRAVENOUS

## 2020-10-19 MED ORDER — CELECOXIB 200 MG PO CAPS
ORAL_CAPSULE | ORAL | Status: AC
  Filled 2020-10-19: qty 1

## 2020-10-19 MED ORDER — CELECOXIB 200 MG PO CAPS
200.0000 mg | ORAL_CAPSULE | ORAL | Status: AC
  Administered 2020-10-19: 200 mg via ORAL

## 2020-10-19 MED ORDER — SCOPOLAMINE 1 MG/3DAYS TD PT72
MEDICATED_PATCH | TRANSDERMAL | Status: AC
  Filled 2020-10-19: qty 1

## 2020-10-19 MED ORDER — LIDOCAINE HCL (PF) 2 % IJ SOLN
INTRAMUSCULAR | Status: AC
  Filled 2020-10-19: qty 5

## 2020-10-19 MED ORDER — SCOPOLAMINE 1 MG/3DAYS TD PT72
1.0000 | MEDICATED_PATCH | TRANSDERMAL | Status: DC
  Administered 2020-10-19: 1.5 mg via TRANSDERMAL

## 2020-10-19 MED ORDER — ONDANSETRON HCL 4 MG/2ML IJ SOLN
INTRAMUSCULAR | Status: DC | PRN
  Administered 2020-10-19: 4 mg via INTRAVENOUS

## 2020-10-19 MED ORDER — ACETAMINOPHEN 500 MG PO TABS
1000.0000 mg | ORAL_TABLET | Freq: Once | ORAL | Status: AC
  Administered 2020-10-19: 1000 mg via ORAL

## 2020-10-19 MED ORDER — OXYCODONE HCL 5 MG PO TABS
ORAL_TABLET | ORAL | Status: AC
  Filled 2020-10-19: qty 1

## 2020-10-19 MED ORDER — ACETAMINOPHEN 500 MG PO TABS: 1000.0000 mg | ORAL_TABLET | ORAL | Status: DC

## 2020-10-19 MED ORDER — HYDROMORPHONE HCL 1 MG/ML IJ SOLN
INTRAMUSCULAR | Status: AC
  Filled 2020-10-19: qty 0.5

## 2020-10-19 MED ORDER — LACTATED RINGERS IV SOLN: INTRAVENOUS | Status: DC

## 2020-10-19 MED ORDER — ROCURONIUM BROMIDE 10 MG/ML (PF) SYRINGE
PREFILLED_SYRINGE | INTRAVENOUS | Status: AC
  Filled 2020-10-19: qty 10

## 2020-10-19 MED ORDER — MIDAZOLAM HCL 2 MG/2ML IJ SOLN
INTRAMUSCULAR | Status: AC
  Filled 2020-10-19: qty 2

## 2020-10-19 MED ORDER — PROMETHAZINE HCL 25 MG/ML IJ SOLN: 6.2500 mg | INTRAMUSCULAR | Status: DC | PRN

## 2020-10-19 MED ORDER — OXYCODONE HCL 5 MG PO TABS
5.0000 mg | ORAL_TABLET | Freq: Once | ORAL | Status: AC | PRN
  Administered 2020-10-19: 5 mg via ORAL

## 2020-10-19 MED ORDER — CEFAZOLIN SODIUM-DEXTROSE 2-4 GM/100ML-% IV SOLN
2.0000 g | INTRAVENOUS | Status: AC
  Administered 2020-10-19: 2 g via INTRAVENOUS

## 2020-10-19 MED ORDER — FENTANYL CITRATE (PF) 100 MCG/2ML IJ SOLN
INTRAMUSCULAR | Status: AC
  Filled 2020-10-19: qty 2

## 2020-10-19 MED ORDER — BUPIVACAINE-EPINEPHRINE 0.25% -1:200000 IJ SOLN
INTRAMUSCULAR | Status: DC | PRN
  Administered 2020-10-19: 10 mL

## 2020-10-19 MED ORDER — MIDAZOLAM HCL 5 MG/5ML IJ SOLN
INTRAMUSCULAR | Status: DC | PRN
  Administered 2020-10-19: 2 mg via INTRAVENOUS

## 2020-10-19 MED ORDER — PROPOFOL 10 MG/ML IV BOLUS
INTRAVENOUS | Status: DC | PRN
  Administered 2020-10-19: 200 mg via INTRAVENOUS

## 2020-10-19 SURGICAL SUPPLY — 47 items
ADH SKN CLS APL DERMABOND .7 (GAUZE/BANDAGES/DRESSINGS) ×1
APL PRP STRL LF DISP 70% ISPRP (MISCELLANEOUS) ×1
APL SKNCLS STERI-STRIP NONHPOA (GAUZE/BANDAGES/DRESSINGS)
BENZOIN TINCTURE PRP APPL 2/3 (GAUZE/BANDAGES/DRESSINGS) IMPLANT
BLADE CLIPPER SURG (BLADE) IMPLANT
BLADE SURG 15 STRL LF DISP TIS (BLADE) ×1 IMPLANT
BLADE SURG 15 STRL SS (BLADE) ×2
CHLORAPREP W/TINT 26 (MISCELLANEOUS) ×2 IMPLANT
COVER BACK TABLE 60X90IN (DRAPES) ×2 IMPLANT
COVER MAYO STAND STRL (DRAPES) ×2 IMPLANT
DECANTER SPIKE VIAL GLASS SM (MISCELLANEOUS) ×2 IMPLANT
DERMABOND ADVANCED (GAUZE/BANDAGES/DRESSINGS) ×1
DERMABOND ADVANCED .7 DNX12 (GAUZE/BANDAGES/DRESSINGS) ×1 IMPLANT
DRAPE LAPAROTOMY TRNSV 102X78 (DRAPES) ×2 IMPLANT
DRAPE UTILITY XL STRL (DRAPES) ×2 IMPLANT
DRSG TEGADERM 4X4.75 (GAUZE/BANDAGES/DRESSINGS) IMPLANT
ELECT COATED BLADE 2.86 ST (ELECTRODE) ×2 IMPLANT
ELECT REM PT RETURN 9FT ADLT (ELECTROSURGICAL) ×2
ELECTRODE REM PT RTRN 9FT ADLT (ELECTROSURGICAL) ×1 IMPLANT
GAUZE SPONGE 4X4 12PLY STRL LF (GAUZE/BANDAGES/DRESSINGS) IMPLANT
GLOVE SURG ENC MOIS LTX SZ7.5 (GLOVE) ×2 IMPLANT
GLOVE SURG POLYISO LF SZ7 (GLOVE) ×2 IMPLANT
GLOVE SURG UNDER POLY LF SZ7 (GLOVE) ×2 IMPLANT
GOWN STRL REUS W/ TWL LRG LVL3 (GOWN DISPOSABLE) ×2 IMPLANT
GOWN STRL REUS W/TWL LRG LVL3 (GOWN DISPOSABLE) ×6
MESH VENTRALEX ST 8CM LRG (Mesh General) ×1 IMPLANT
NDL HYPO 25X1 1.5 SAFETY (NEEDLE) ×1 IMPLANT
NEEDLE HYPO 22GX1.5 SAFETY (NEEDLE) IMPLANT
NEEDLE HYPO 25X1 1.5 SAFETY (NEEDLE) ×2 IMPLANT
NS IRRIG 1000ML POUR BTL (IV SOLUTION) ×1 IMPLANT
PACK BASIN DAY SURGERY FS (CUSTOM PROCEDURE TRAY) ×2 IMPLANT
PENCIL SMOKE EVACUATOR (MISCELLANEOUS) ×2 IMPLANT
SLEEVE SCD COMPRESS KNEE MED (STOCKING) ×1 IMPLANT
SPONGE T-LAP 18X18 ~~LOC~~+RFID (SPONGE) ×2 IMPLANT
STRIP CLOSURE SKIN 1/2X4 (GAUZE/BANDAGES/DRESSINGS) IMPLANT
SUT MON AB 4-0 PC3 18 (SUTURE) ×3 IMPLANT
SUT NOVA NAB DX-16 0-1 5-0 T12 (SUTURE) ×2 IMPLANT
SUT NOVA NAB GS-21 1 T12 (SUTURE) ×2 IMPLANT
SUT SILK 3 0 SH 30 (SUTURE) IMPLANT
SUT VIC AB 2-0 SH 27 (SUTURE) ×2
SUT VIC AB 2-0 SH 27XBRD (SUTURE) ×1 IMPLANT
SUT VIC AB 3-0 54X BRD REEL (SUTURE) IMPLANT
SUT VIC AB 3-0 BRD 54 (SUTURE)
SUT VIC AB 3-0 SH 27 (SUTURE) ×2
SUT VIC AB 3-0 SH 27X BRD (SUTURE) IMPLANT
SYR CONTROL 10ML LL (SYRINGE) ×2 IMPLANT
TOWEL GREEN STERILE FF (TOWEL DISPOSABLE) ×3 IMPLANT

## 2020-10-19 NOTE — Transfer of Care (Signed)
Immediate Anesthesia Transfer of Care Note  Patient: Ruth Schmidt  Procedure(s) Performed: UMBILICAL HERNIA REPAIR WITH MESH (Abdomen)  Patient Location: PACU  Anesthesia Type:General  Level of Consciousness: drowsy, patient cooperative and responds to stimulation  Airway & Oxygen Therapy: Patient Spontanous Breathing and Patient connected to face mask oxygen  Post-op Assessment: Report given to RN and Post -op Vital signs reviewed and stable  Post vital signs: Reviewed and stable  Last Vitals:  Vitals Value Taken Time  BP 168/109 10/19/20 1103  Temp    Pulse 77 10/19/20 1104  Resp 18 10/19/20 1104  SpO2 100 % 10/19/20 1104  Vitals shown include unvalidated device data.  Last Pain:  Vitals:   10/19/20 0915  TempSrc: Oral  PainSc: 0-No pain         Complications: No notable events documented.

## 2020-10-19 NOTE — Interval H&P Note (Signed)
History and Physical Interval Note:  10/19/2020 10:57 AM  Ruth Schmidt  has presented today for surgery, with the diagnosis of UMBILICAL HERNIA.  The various methods of treatment have been discussed with the patient and family. After consideration of risks, benefits and other options for treatment, the patient has consented to  Procedure(s): UMBILICAL HERNIA REPAIR WITH MESH (N/A) as a surgical intervention.  The patient's history has been reviewed, patient examined, no change in status, stable for surgery.  I have reviewed the patient's chart and labs.  Questions were answered to the patient's satisfaction.     [name]

## 2020-10-19 NOTE — Op Note (Addendum)
10/19/2020  10:57 AM  PATIENT:  Ruth Schmidt  50 y.o. female  PRE-OPERATIVE DIAGNOSIS:  UMBILICAL HERNIA  POST-OPERATIVE DIAGNOSIS:  UMBILICAL HERNIA, supraumbilical hernia  PROCEDURE:  Procedure(s): UMBILICAL HERNIA REPAIR WITH MESH (N/A) and repair supraumbilical hernia  SURGEON:  Surgeon(s) and Role:    * Griselda Miner, MD - Primary  PHYSICIAN ASSISTANT:   ASSISTANTS: Dr. Gwenith Daily   ANESTHESIA:   local and general  EBL:  10 mL   BLOOD ADMINISTERED:none  DRAINS: none   LOCAL MEDICATIONS USED:  MARCAINE     SPECIMEN:  Source of Specimen:  hernia sac  DISPOSITION OF SPECIMEN:  PATHOLOGY  COUNTS:  YES  TOURNIQUET:  * No tourniquets in log *  DICTATION: .Dragon Dictation  After informed consent was obtained the patient was brought to the operating room and placed in the supine position on the operating table.  After adequate induction of general anesthesia the patient's abdomen was prepped with ChloraPrep, allowed to dry, and draped in usual sterile manner.  An appropriate timeout was performed.  The area around the umbilicus was infiltrated with quarter percent Marcaine.  A vertically oriented incision was made just above and through the upper part of the umbilicus with a 15 blade knife.  The incision was carried through the skin and subcutaneous tissue sharply with the electrocautery until the dissection reached the linea alba.  The subcutaneous tissue was cleared off the linea alba sharply with the electrocautery.  Superior to the umbilicus there was a small slitlike defect measuring about 3 mm.  There was some preperitoneal fat that herniated through the defect which was amputated by clamping it with 2 hemostats, divided and ligated with 3-0 silk ties.  The rest of the preperitoneal fat was then able to be easily reduced.  This fascial defect was closed with interrupted #1 Novafil stitches.  At the umbilicus there was also a fascial defect that was more wide and broad-based.   The hernia sac was opened sharply with the electrocautery.  There were no omentum or visceral contents within the sac.  The fascial edges were defined.  A large piece of umbilical hernia mesh from ventral light was chosen.  The mesh was placed in the subfascial location and the fascial defect was then closed incorporating the anchors of the mesh.  Once this was accomplished the mesh was in good position and the hernia seem well repaired.  The wound was irrigated with saline.  The deep layer of the wound was then closed with interrupted 3-0 Vicryl stitches.  The skin was then closed with interrupted 4-0 Monocryl subcuticular stitches.  Dermabond dressings were applied.  The patient tolerated the procedure well.  At the end of the case all needle sponge and instrument counts were correct.  The patient was then awakened and taken to recovery in stable condition. I was personally present during the key and critical portions of this procedure and immediately available throughout the entire procedure, as documented in my operative note.   PLAN OF CARE: Discharge to home after PACU  PATIENT DISPOSITION:  PACU - hemodynamically stable.   Delay start of Pharmacological VTE agent (>24hrs) due to surgical blood loss or risk of bleeding: not applicable

## 2020-10-19 NOTE — Discharge Instructions (Addendum)
No tylenol until after 3:30pm today. No ibuprofen/Motrin until after 5:30pm today. Oxycodone 5mg  given at 12:05 p.m.  Post Anesthesia Home Care Instructions  Activity: Get plenty of rest for the remainder of the day. A responsible individual must stay with you for 24 hours following the procedure.  For the next 24 hours, DO NOT: -Drive a car - -Drink alcoholic beverages -Take any medication unless instructed by your physician -Make any legal decisions or sign important papers.  Meals: Start with liquid foods such as gelatin or soup. Progress to regular foods as tolerated. Avoid greasy, spicy, heavy foods. If nausea and/or vomiting occur, drink only clear liquids until the nausea and/or vomiting subsides. Call your physician if vomiting continues.  Special Instructions/Symptoms: Your throat may feel dry or sore from the anesthesia or the breathing tube placed in your throat during surgery. If this causes discomfort, gargle with warm salt water. The discomfort should disappear within 24 hours.  If you had a scopolamine patch placed behind your ear for the management of post- operative nausea and/or vomiting:  1. The medication in the patch is effective for 72 hours, after which it should be removed.  Wrap patch in a tissue and discard in the trash. Wash hands thoroughly with soap and water. 2. You may remove the patch earlier than 72 hours if you experience unpleasant side effects which may include dry mouth, dizziness or visual disturbances. 3. Avoid touching the patch. Wash your hands with soap and water after contact with the patch.

## 2020-10-19 NOTE — Anesthesia Postprocedure Evaluation (Signed)
Anesthesia Post Note  Patient: Ruth Schmidt  Procedure(s) Performed: UMBILICAL HERNIA REPAIR WITH MESH (Abdomen)     Patient location during evaluation: PACU Anesthesia Type: General Level of consciousness: awake and alert, oriented and patient cooperative Pain management: pain level controlled Vital Signs Assessment: post-procedure vital signs reviewed and stable Respiratory status: spontaneous breathing, nonlabored ventilation and respiratory function stable Cardiovascular status: blood pressure returned to baseline and stable Postop Assessment: no apparent nausea or vomiting Anesthetic complications: no   No notable events documented.  Last Vitals:  Vitals:   10/19/20 1137 10/19/20 1145  BP:  (!) 136/95  Pulse:  76  Resp:  14  Temp:    SpO2: 97% 97%    Last Pain:  Vitals:   10/19/20 1145  TempSrc:   PainSc: Asleep                 Lannie Fields

## 2020-10-19 NOTE — Anesthesia Procedure Notes (Signed)
Procedure Name: Intubation Date/Time: 10/19/2020 9:37 AM Performed by: Thornell Mule, CRNA Pre-anesthesia Checklist: Patient identified, Emergency Drugs available, Suction available and Patient being monitored Patient Re-evaluated:Patient Re-evaluated prior to induction Oxygen Delivery Method: Circle system utilized Preoxygenation: Pre-oxygenation with 100% oxygen Induction Type: IV induction Ventilation: Mask ventilation without difficulty Laryngoscope Size: Miller and 3 Grade View: Grade II Tube type: Oral Tube size: 7.0 mm Number of attempts: 1 Airway Equipment and Method: Stylet and Oral airway Placement Confirmation: ETT inserted through vocal cords under direct vision, positive ETCO2 and breath sounds checked- equal and bilateral Secured at: 21 cm Tube secured with: Tape Dental Injury: Teeth and Oropharynx as per pre-operative assessment

## 2020-10-19 NOTE — H&P (Signed)
Ruth Schmidt  Location: [facility] Patient #: [id number] DOB: 1971-03-02 Divorced / Language: English / Race: Black or African American Female   History of Present Illness  The patient is a 50 year old female who presents with abdominal swelling. We are asked to see the patient in consultation by Dr. Oliver Barre to evaluate her for an umbilical hernia.  The patient is a 50 year old black female who has noticed a knot near her belly button for about the last year.  She does have some occasional tenderness associated with it.  She denies any nausea or vomiting.  She does have some chronic constipation from iron pills that she takes.  Her only previous surgery is a C-section.  She is otherwise in pretty good health and does not smoke.   Past Surgical History  Cesarean Section - 1    Diagnostic Studies History Colonoscopy   never Mammogram   1-3 years ago Pap Smear   1-5 years ago  Allergies  No Known Drug Allergies     Medication History amLODIPine Besylate  (5MG  Tablet, Oral) Active. Gabapentin  (100MG  Capsule, Oral) Active. hydroCHLOROthiazide  (12.5MG  Capsule, Oral) Active. Medications Reconciled   Social History  Alcohol use   Occasional alcohol use. Caffeine use   Coffee. No drug use   Tobacco use   Never smoker.  Family History  Hypertension   Mother. Prostate Cancer   Father.  Pregnancy / Birth History  Age at menarche   15 years. Contraceptive History   Depo-provera. Gravida   5 Maternal age   25-20 Para   3 Regular periods    Other Problems  High blood pressure      Review of Systems  General Not Present- Appetite Loss, Chills, Fatigue, Fever, Night Sweats, Weight Gain and Weight Loss. Skin Present- Dryness and Rash. Not Present- Change in Wart/Mole, Hives, Jaundice, New Lesions, Non-Healing Wounds and Ulcer. HEENT Not Present- Earache, Hearing Loss, Hoarseness, Nose Bleed, Oral Ulcers, Ringing in the Ears, Seasonal Allergies, Sinus Pain,  Sore Throat, Visual Disturbances, Wears glasses/contact lenses and Yellow Eyes. Respiratory Not Present- Bloody sputum, Chronic Cough, Difficulty Breathing, Snoring and Wheezing. Breast Not Present- Breast Mass, Breast Pain, Nipple Discharge and Skin Changes. Cardiovascular Present- Leg Cramps and Swelling of Extremities. Not Present- Chest Pain, Difficulty Breathing Lying Down, Palpitations, Rapid Heart Rate and Shortness of Breath. Gastrointestinal Present- Bloating, Constipation and Excessive gas. Not Present- Abdominal Pain, Bloody Stool, Change in Bowel Habits, Chronic diarrhea, Difficulty Swallowing, Gets full quickly at meals, Hemorrhoids, Indigestion, Nausea, Rectal Pain and Vomiting. Female Genitourinary Present- Frequency. Not Present- Nocturia, Painful Urination, Pelvic Pain and Urgency. Neurological Present- Decreased Memory, Numbness and Tingling. Not Present- Fainting, Headaches, Seizures, Tremor, Trouble walking and Weakness. Psychiatric Not Present- Anxiety, Bipolar, Change in Sleep Pattern, Depression, Fearful and Frequent crying. Endocrine Present- Hot flashes. Not Present- Cold Intolerance, Excessive Hunger, Hair Changes, Heat Intolerance and New Diabetes. Hematology Not Present- Blood Thinners, Easy Bruising, Excessive bleeding, Gland problems, HIV and Persistent Infections.  Vitals  Weight: 151 lb   Height: 63 in  Body Surface Area: 1.72 m   Body Mass Index: 26.75 kg/m   Temp.: 97.9 F    Pulse: 100 (Regular)    P.OX: 97% (Room air) BP: 160/100(Sitting, Left Arm, Standard)       Physical Exam  General Mental Status - Alert. General Appearance - Consistent with stated age. Hydration - Well hydrated. Voice - Normal.  Head and Neck Head - normocephalic, atraumatic with no  lesions or palpable masses. Trachea - midline. Thyroid Gland Characteristics - normal size and consistency.  Eye Eyeball - Bilateral - Extraocular movements intact. Sclera/Conjunctiva -  Bilateral - No scleral icterus.  Chest and Lung Exam Chest and lung exam reveals  - quiet, even and easy respiratory effort with no use of accessory muscles and on auscultation, normal breath sounds, no adventitious sounds and normal vocal resonance. Inspection Chest Wall - Normal. Back - normal.  Cardiovascular Cardiovascular examination reveals  - normal heart sounds, regular rate and rhythm with no murmurs and normal pedal pulses bilaterally.  Abdomen Note:  The abdomen is soft and nontender. There appears to be a small reducible hernia with a small fascial defect at the base of the umbilicus. There is some small area of fullness just above this as well. The surgical repair of this hernia may be more amenable with a vertically oriented incision to look at both areas.   Neurologic Neurologic evaluation reveals  - alert and oriented x 3 with no impairment of recent or remote memory. Mental Status - Normal.  Musculoskeletal Normal Exam - Left - Upper Extremity Strength Normal and Lower Extremity Strength Normal. Normal Exam - Right - Upper Extremity Strength Normal and Lower Extremity Strength Normal.  Lymphatic Head & Neck  General Head & Neck Lymphatics: Bilateral - Description - Normal. Axillary  General Axillary Region: Bilateral - Description - Normal. Tenderness - Non Tender. Femoral & Inguinal  Generalized Femoral & Inguinal Lymphatics: Bilateral - Description - Normal. Tenderness - Non Tender.    Assessment & Plan UMBILICAL HERNIA WITHOUT OBSTRUCTION OR GANGRENE (K42.9) Impression: The patient appears to have a small but symptomatic umbilical hernia and may have a small supraumbilical hernia associated with it. Because of the risk of incarceration and strangulation feels she would benefit from having this fixed. She would also like to have this done. I have discussed with her in detail the risks and benefits of the operation to fix the hernia as well as some of the  technical aspects including the use of mesh and she understands and wishes to proceed. She is also waiting to see neurosurgery to be evaluated for a herniated disc in her back. She will think about the timing of how she would like the surgery to be. This patient encounter took 30 minutes today to perform the following: take history, perform exam, review outside records, interpret imaging, counsel the patient on their diagnosis and document encounter, findings & plan in the EHR

## 2020-10-20 ENCOUNTER — Encounter (HOSPITAL_BASED_OUTPATIENT_CLINIC_OR_DEPARTMENT_OTHER): Payer: Self-pay | Admitting: General Surgery

## 2020-10-20 LAB — SURGICAL PATHOLOGY

## 2021-02-23 ENCOUNTER — Ambulatory Visit: Payer: 59 | Admitting: Internal Medicine

## 2021-03-02 ENCOUNTER — Other Ambulatory Visit: Payer: Self-pay

## 2021-03-02 ENCOUNTER — Ambulatory Visit (INDEPENDENT_AMBULATORY_CARE_PROVIDER_SITE_OTHER): Payer: 59 | Admitting: Internal Medicine

## 2021-03-02 VITALS — BP 162/98 | HR 90 | Temp 98.0°F

## 2021-03-02 DIAGNOSIS — D509 Iron deficiency anemia, unspecified: Secondary | ICD-10-CM

## 2021-03-02 DIAGNOSIS — R739 Hyperglycemia, unspecified: Secondary | ICD-10-CM

## 2021-03-02 DIAGNOSIS — I10 Essential (primary) hypertension: Secondary | ICD-10-CM

## 2021-03-02 DIAGNOSIS — E559 Vitamin D deficiency, unspecified: Secondary | ICD-10-CM | POA: Diagnosis not present

## 2021-03-02 DIAGNOSIS — M5416 Radiculopathy, lumbar region: Secondary | ICD-10-CM | POA: Diagnosis not present

## 2021-03-02 DIAGNOSIS — E538 Deficiency of other specified B group vitamins: Secondary | ICD-10-CM

## 2021-03-02 MED ORDER — OLMESARTAN-AMLODIPINE-HCTZ 40-5-12.5 MG PO TABS: ORAL_TABLET | ORAL | 3 refills | Status: DC

## 2021-03-02 NOTE — Progress Notes (Signed)
Patient ID: Ruth Schmidt, female   DOB: [birth year], 50 y.o.   MRN: [id number]        Chief Complaint: follow up HTN, low vit D, lumbar DDD, constipation with iron deficiency       HPI:  Ruth Schmidt is a 50 y.o. female here with c/o elevated BP mild at home, Pt denies chest pain, increased sob or doe, wheezing, orthopnea, PND, increased LE swelling, palpitations, dizziness or syncope.   Pt denies polydipsia, polyuria, or new focal neuro s/s   Denies worsening reflux, abd pain, dysphagia, n/v, or blood, but has constipation with iron supplement so often misses taking it.  No recent overt bleeding.  Not taking Vit D.   Pt denies fever, wt loss, night sweats, loss of appetite, or other constitutional symptoms  No other new complaints       Wt Readings from Last 3 Encounters:  10/19/20 154 lb 5.2 oz (70 kg)  08/24/20 152 lb (68.9 kg)  07/22/20 156 lb (70.8 kg)   BP Readings from Last 3 Encounters:  03/02/21 (!) 162/98  10/19/20 (!) 158/92  08/24/20 (!) 142/86         Past Medical History:  Diagnosis Date   Abnormal Pap smear of cervix    Hypertension    Lumbar herniated disc    Past Surgical History:  Procedure Laterality Date   CESAREAN SECTION     TUBAL LIGATION Bilateral 2005   UMBILICAL HERNIA REPAIR N/A 10/19/2020   Procedure: UMBILICAL HERNIA REPAIR WITH MESH;  Surgeon: Griselda Miner, MD;  Location: [facility];  Service: General;  Laterality: N/A;    reports that she has never smoked. She has never used smokeless tobacco. She reports current alcohol use. She reports that she does not use drugs. family history includes Breast cancer (age of onset: 87) in her maternal aunt; Cancer in her unknown relative; Colon cancer in her maternal uncle; Coronary artery disease (age of onset: 60) in her maternal grandfather; Diabetes in her father and paternal grandmother; Hyperlipidemia in her father; Hypertension in her maternal grandmother and mother; Lung cancer in her  maternal uncle; Stroke (age of onset: 74) in her maternal aunt. No Known Allergies Current Outpatient Medications on File Prior to Visit  Medication Sig Dispense Refill   amLODipine (NORVASC) 5 MG tablet Take 1 tablet (5 mg total) by mouth daily. 90 tablet 3   Cholecalciferol (THERA-D 2000) 50 MCG (2000 UT) TABS 1 tab by mouth once daily 30 tablet 99   gabapentin (NEURONTIN) 100 MG capsule Take 1 capsule (100 mg total) by mouth 3 (three) times daily. 90 capsule 5   hydrochlorothiazide (MICROZIDE) 12.5 MG capsule Take 1 capsule (12.5 mg total) by mouth daily. 90 capsule 3   HYDROcodone-acetaminophen (NORCO/VICODIN) 5-325 MG tablet Take 1-2 tablets by mouth every 6 (six) hours as needed for moderate pain or severe pain. 15 tablet 0   No current facility-administered medications on file prior to visit.        ROS:  All others reviewed and negative.  Objective        PE:  BP (!) 162/98 (BP Location: Right Arm, Patient Position: Sitting, Cuff Size: Normal)    Pulse 90    Temp 98 F (36.7 C) (Oral)    SpO2 99%                 Constitutional: Pt appears in NAD  HENT: Head: NCAT.                Right Ear: External ear normal.                 Left Ear: External ear normal.                Eyes: . Pupils are equal, round, and reactive to light. Conjunctivae and EOM are normal               Nose: without d/c or deformity               Neck: Neck supple. Gross normal ROM               Cardiovascular: Normal rate and regular rhythm.                 Pulmonary/Chest: Effort normal and breath sounds without rales or wheezing.                Abd:  Soft, NT, ND, + BS, no organomegaly               Neurological: Pt is alert. At baseline orientation, motor grossly intact               Skin: Skin is warm. No rashes, no other new lesions, LE edema - none               Psychiatric: Pt behavior is normal without agitation   Micro: none  Cardiac tracings I have personally interpreted today:   none  Pertinent Radiological findings (summarize): none   Lab Results  Component Value Date   WBC 4.0 07/22/2020   HGB 10.3 (L) 07/22/2020   HCT 32.0 (L) 07/22/2020   PLT 303.0 07/22/2020   GLUCOSE 94 10/14/2020   CHOL 188 07/22/2020   TRIG 87.0 07/22/2020   HDL 63.30 07/22/2020   LDLDIRECT 121.7 01/04/2013   LDLCALC 107 (H) 07/22/2020   ALT 22 07/22/2020   AST 21 07/22/2020   NA 136 10/14/2020   K 3.8 10/14/2020   CL 102 10/14/2020   CREATININE 0.70 10/14/2020   BUN 11 10/14/2020   CO2 27 10/14/2020   TSH 2.72 07/22/2020   HGBA1C 5.3 10/27/2017   Assessment/Plan:  Ruth Schmidt is a 50 y.o. Black or African American [2] female with  has a past medical history of Abnormal Pap smear of cervix, Hypertension, and Lumbar herniated disc.  Hypertension, uncontrolled BP Readings from Last 3 Encounters:  03/02/21 (!) 162/98  10/19/20 (!) 158/92  08/24/20 (!) 142/86   Mild to mod uncontrolled, pt to change and increase medical treatment as tribenzor asd, to f/u BP at home and next visit   Anemia, iron deficiency With worsening constipation, ok for change to slow Iron 1 qd, miralax prn,  to f/u any worsening symptoms or concerns  Vitamin D deficiency Last vitamin D Lab Results  Component Value Date   VD25OH 26.89 (L) 07/22/2020   Low, reminded to start oral replacement   Left lumbar radiculopathy Per pt unfortunately failed ESI, now for surgury soon,  to f/u any worsening symptoms or concerns  Followup: Return in about 4 weeks (around 03/30/2021).  Oliver Barre, MD 03/07/2021 6:50 AM [facility]

## 2021-03-02 NOTE — Patient Instructions (Signed)
Ok to take the new tribenzor medication - 1 pill per day  Ok to stop the amlodipine and HCT that you take now after you start the new one  Please continue all other medications as before, and refills have been done if requested.  Please have the pharmacy call with any other refills you may need.  Please continue your efforts at being more active, low cholesterol diet, and weight control  Please keep your appointments with your specialists as you may have planned  Please make an Appointment to return in 1 months, or sooner if needed, with labs done a few days ahead at the [facility] if you like

## 2021-03-07 ENCOUNTER — Encounter: Payer: Self-pay | Admitting: Internal Medicine

## 2021-03-07 NOTE — Assessment & Plan Note (Signed)
With worsening constipation, ok for change to slow Iron 1 qd, miralax prn,  to f/u any worsening symptoms or concerns

## 2021-03-07 NOTE — Assessment & Plan Note (Signed)
BP Readings from Last 3 Encounters:  03/02/21 (!) 162/98  10/19/20 (!) 158/92  08/24/20 (!) 142/86   Mild to mod uncontrolled, pt to change and increase medical treatment as tribenzor asd, to f/u BP at home and next visit

## 2021-03-07 NOTE — Assessment & Plan Note (Signed)
Last vitamin D Lab Results  Component Value Date   VD25OH 26.89 (L) 07/22/2020   Low, reminded to start oral replacement

## 2021-03-07 NOTE — Assessment & Plan Note (Signed)
Per pt unfortunately failed ESI, now for surgury soon,  to f/u any worsening symptoms or concerns

## 2021-04-05 ENCOUNTER — Other Ambulatory Visit: Payer: 59

## 2021-04-07 ENCOUNTER — Ambulatory Visit: Payer: 59 | Admitting: Internal Medicine

## 2021-04-12 ENCOUNTER — Ambulatory Visit: Payer: Self-pay | Admitting: Internal Medicine

## 2021-05-27 ENCOUNTER — Telehealth: Payer: Self-pay

## 2021-05-27 MED ORDER — HYDROCHLOROTHIAZIDE 12.5 MG PO CAPS: 12.5000 mg | ORAL_CAPSULE | Freq: Every day | ORAL | 2 refills | Status: DC

## 2021-05-27 NOTE — Telephone Encounter (Signed)
Pt is calling requesting a refill:  ?hydrochlorothiazide (MICROZIDE) 12.5 MG capsule ? ?Pharmacy: ?[id number] - [place], [place] - [address] ? ?Encourage pt to call Pharmacy for future refills.  ? ?LOV 03/02/21 ?

## 2021-06-23 ENCOUNTER — Telehealth: Payer: Self-pay

## 2021-06-23 NOTE — Telephone Encounter (Signed)
? ?  Hypertension Outreach Note  ?Pharmacy Note ? ?Spoke with patient, reports that she has been compliant with her medications, notes that she has not been checking BP at home, wants to schedule an appointment with PCP due to swelling she is having in her feet  ? ?Hypertension (BP goal <140/90) ?-Not ideally controlled ?-Current treatment: ?Olmesartan-Amlodipine-HCTZ 40-5-12.5mg  daily  ?HCTZ 12.66mcg daily  ?Amlodipine 5mg  daily  ?-Medications previously tried: losartan   ?-Current home readings: n/a - not checking at home currently  ?-Denies hypotensive/hypertensive symptoms ?-Educated on BP goals and benefits of medications for prevention of heart attack, stroke and kidney damage; ?Daily salt intake goal < 2300 mg; ?Exercise goal of 150 minutes per week; ?Importance of home blood pressure monitoring; ?Proper BP monitoring technique; ?-Counseled to monitor BP at home daily, document, and provide log at future appointments ?-Recommended to continue current medication ? ?Follow up PCP appointment scheduled as requested, patient will bring in medications and BP log to discuss  ? ? , PharmD ?Clinical Pharmacist, [facility]  ?

## 2021-06-30 ENCOUNTER — Encounter: Payer: Self-pay | Admitting: Internal Medicine

## 2021-06-30 ENCOUNTER — Ambulatory Visit (INDEPENDENT_AMBULATORY_CARE_PROVIDER_SITE_OTHER): Payer: Self-pay | Admitting: Internal Medicine

## 2021-06-30 VITALS — BP 138/70 | HR 94 | Temp 98.6°F | Ht 63.0 in | Wt 151.0 lb

## 2021-06-30 DIAGNOSIS — E538 Deficiency of other specified B group vitamins: Secondary | ICD-10-CM

## 2021-06-30 DIAGNOSIS — M5416 Radiculopathy, lumbar region: Secondary | ICD-10-CM

## 2021-06-30 DIAGNOSIS — N920 Excessive and frequent menstruation with regular cycle: Secondary | ICD-10-CM

## 2021-06-30 DIAGNOSIS — R609 Edema, unspecified: Secondary | ICD-10-CM

## 2021-06-30 DIAGNOSIS — E559 Vitamin D deficiency, unspecified: Secondary | ICD-10-CM

## 2021-06-30 DIAGNOSIS — R600 Localized edema: Secondary | ICD-10-CM | POA: Insufficient documentation

## 2021-06-30 DIAGNOSIS — Z0001 Encounter for general adult medical examination with abnormal findings: Secondary | ICD-10-CM

## 2021-06-30 DIAGNOSIS — I10 Essential (primary) hypertension: Secondary | ICD-10-CM

## 2021-06-30 DIAGNOSIS — D509 Iron deficiency anemia, unspecified: Secondary | ICD-10-CM

## 2021-06-30 DIAGNOSIS — Z1211 Encounter for screening for malignant neoplasm of colon: Secondary | ICD-10-CM

## 2021-06-30 LAB — URINALYSIS, ROUTINE W REFLEX MICROSCOPIC
Bilirubin Urine: NEGATIVE
Hgb urine dipstick: NEGATIVE
Ketones, ur: NEGATIVE
Leukocytes,Ua: NEGATIVE
Nitrite: NEGATIVE
RBC / HPF: NONE SEEN (ref 0–?)
Specific Gravity, Urine: 1.02 (ref 1.000–1.030)
Total Protein, Urine: NEGATIVE
Urine Glucose: NEGATIVE
Urobilinogen, UA: 0.2 (ref 0.0–1.0)
pH: 6 (ref 5.0–8.0)

## 2021-06-30 LAB — FERRITIN: Ferritin: 5.6 ng/mL — ABNORMAL LOW (ref 10.0–291.0)

## 2021-06-30 LAB — IBC PANEL
Iron: 63 ug/dL (ref 42–145)
Saturation Ratios: 13.1 % — ABNORMAL LOW (ref 20.0–50.0)
TIBC: 480.2 ug/dL — ABNORMAL HIGH (ref 250.0–450.0)
Transferrin: 343 mg/dL (ref 212.0–360.0)

## 2021-06-30 LAB — CBC WITH DIFFERENTIAL/PLATELET
Basophils Absolute: 0 10*3/uL (ref 0.0–0.1)
Basophils Relative: 0.3 % (ref 0.0–3.0)
Eosinophils Absolute: 0 10*3/uL (ref 0.0–0.7)
Eosinophils Relative: 0.7 % (ref 0.0–5.0)
HCT: 29.6 % — ABNORMAL LOW (ref 36.0–46.0)
Hemoglobin: 9.2 g/dL — ABNORMAL LOW (ref 12.0–15.0)
Lymphocytes Relative: 26.2 % (ref 12.0–46.0)
Lymphs Abs: 1 10*3/uL (ref 0.7–4.0)
MCHC: 31.1 g/dL (ref 30.0–36.0)
MCV: 86.8 fl (ref 78.0–100.0)
Monocytes Absolute: 0.4 10*3/uL (ref 0.1–1.0)
Monocytes Relative: 10.3 % (ref 3.0–12.0)
Neutro Abs: 2.4 10*3/uL (ref 1.4–7.7)
Neutrophils Relative %: 62.5 % (ref 43.0–77.0)
Platelets: 299 10*3/uL (ref 150.0–400.0)
RBC: 3.41 Mil/uL — ABNORMAL LOW (ref 3.87–5.11)
RDW: 17 % — ABNORMAL HIGH (ref 11.5–15.5)
WBC: 3.8 10*3/uL — ABNORMAL LOW (ref 4.0–10.5)

## 2021-06-30 LAB — LIPID PANEL
Cholesterol: 181 mg/dL (ref 0–200)
HDL: 59.3 mg/dL (ref >39.00)
LDL Cholesterol: 99 mg/dL (ref 0–99)
NonHDL: 122.12
Total CHOL/HDL Ratio: 3
Triglycerides: 114 mg/dL (ref 0.0–149.0)
VLDL: 22.8 mg/dL (ref 0.0–40.0)

## 2021-06-30 LAB — VITAMIN D 25 HYDROXY (VIT D DEFICIENCY, FRACTURES): VITD: 32.67 ng/mL (ref 30.00–100.00)

## 2021-06-30 LAB — BASIC METABOLIC PANEL
BUN: 10 mg/dL (ref 6–23)
CO2: 30 mEq/L (ref 19–32)
Calcium: 9.3 mg/dL (ref 8.4–10.5)
Chloride: 102 mEq/L (ref 96–112)
Creatinine, Ser: 0.72 mg/dL (ref 0.40–1.20)
GFR: 97.69 mL/min (ref >60.00)
Glucose, Bld: 72 mg/dL (ref 70–99)
Potassium: 3.6 mEq/L (ref 3.5–5.1)
Sodium: 138 mEq/L (ref 135–145)

## 2021-06-30 LAB — HEPATIC FUNCTION PANEL
ALT: 25 U/L (ref 0–35)
AST: 26 U/L (ref 0–37)
Albumin: 4.2 g/dL (ref 3.5–5.2)
Alkaline Phosphatase: 46 U/L (ref 39–117)
Bilirubin, Direct: 0.1 mg/dL (ref 0.0–0.3)
Total Bilirubin: 0.4 mg/dL (ref 0.2–1.2)
Total Protein: 7.3 g/dL (ref 6.0–8.3)

## 2021-06-30 LAB — BRAIN NATRIURETIC PEPTIDE: Pro B Natriuretic peptide (BNP): 7 pg/mL (ref 0.0–100.0)

## 2021-06-30 LAB — TSH: TSH: 3.23 u[IU]/mL (ref 0.35–5.50)

## 2021-06-30 LAB — VITAMIN B12: Vitamin B-12: 1368 pg/mL — ABNORMAL HIGH (ref 211–911)

## 2021-06-30 MED ORDER — AMLODIPINE BESYLATE 5 MG PO TABS: 5.0000 mg | ORAL_TABLET | Freq: Every day | ORAL | 3 refills | Status: DC

## 2021-06-30 MED ORDER — HYDROCHLOROTHIAZIDE 25 MG PO TABS: 25.0000 mg | ORAL_TABLET | Freq: Every day | ORAL | 3 refills | Status: DC

## 2021-06-30 NOTE — Progress Notes (Signed)
Patient ID: Ruth Schmidt, female   DOB: 29-Sep-1970, 51 y.o.   MRN: [id number] ? ? ? ?     Chief Complaint:: wellness exam and Office Visit (Swelling in feet) ? , htn, iron deficiency anemia ? ?     HPI:  Ruth Schmidt is a 51 y.o. female here for wellness exam; will see GYN herself soon, delcines covid bososter, shingrix, but ok for colonoscopy; o/w up to date ?         ?              Also has no recent overt bleeding, has not been taking iron recently in the past 1-2 mo.  Does have worsening feet then leg edema to mid legs in the past 3 wks gradualy worsening, better in the AM after legs elevated, worse in the PM after up all day.  No hx of CV disease.  Good compliiance with BP meds.  Pt denies chest pain, increased sob or doe, wheezing, orthopnea, PND, palpitations, dizziness or syncope. Pt denies new neurological symptoms such as new headache, or facial or extremity weakness or numbness   Pt denies polydipsia, polyuria, or new focal neuro s/s.    Pt denies fever, wt loss, night sweats, loss of appetite, or other constitutional symptoms   ?  ?Wt Readings from Last 3 Encounters:  ?06/30/21 151 lb (68.5 kg)  ?10/19/20 154 lb 5.2 oz (70 kg)  ?08/24/20 152 lb (68.9 kg)  ? ?BP Readings from Last 3 Encounters:  ?06/30/21 138/70  ?03/02/21 (!) 162/98  ?10/19/20 (!) 158/92  ? ?Immunization History  ?Administered Date(s) Administered  ? PFIZER(Purple Top)SARS-COV-2 Vaccination 05/24/2019, 06/14/2019  ? Tdap 03/21/2006, 06/28/2017  ? ?Health Maintenance Due  ?Topic Date Due  ? COLONOSCOPY (Pts 45-48yrs Insurance coverage will need to be confirmed)  Never done  ? ?  ? ?Past Medical History:  ?Diagnosis Date  ? Abnormal Pap smear of cervix   ? Hypertension   ? Lumbar herniated disc   ? ?Past Surgical History:  ?Procedure Laterality Date  ? CESAREAN SECTION    ? TUBAL LIGATION Bilateral 2005  ? UMBILICAL HERNIA REPAIR N/A 10/19/2020  ? Procedure: UMBILICAL HERNIA REPAIR WITH MESH;  Surgeon: Griselda Miner, MD;  Location:  [facility];  Service: General;  Laterality: N/A;  ? ? reports that she has never smoked. She has never used smokeless tobacco. She reports current alcohol use. She reports that she does not use drugs. ?family history includes Breast cancer (age of onset: 67) in her maternal aunt; Cancer in her unknown relative; Colon cancer in her maternal uncle; Coronary artery disease (age of onset: 74) in her maternal grandfather; Diabetes in her father and paternal grandmother; Hyperlipidemia in her father; Hypertension in her maternal grandmother and mother; Lung cancer in her maternal uncle; Stroke (age of onset: 61) in her maternal aunt. ?No Known Allergies ?Current Outpatient Medications on File Prior to Visit  ?Medication Sig Dispense Refill  ? Cholecalciferol (THERA-D 2000) 50 MCG (2000 UT) TABS 1 tab by mouth once daily 30 tablet 99  ? gabapentin (NEURONTIN) 100 MG capsule Take 1 capsule (100 mg total) by mouth 3 (three) times daily. 90 capsule 5  ? ?No current facility-administered medications on file prior to visit.  ? ?     ROS:  All others reviewed and negative. ? ?Objective  ? ?     PE:  BP 138/70 (BP Location: Right Arm, Patient Position: Sitting, Cuff Size: Normal)  Pulse 94   Temp 98.6 ?F (37 ?C) (Oral)   Ht 5\' 3"  (1.6 m)   Wt 151 lb (68.5 kg)   SpO2 100%   BMI 26.75 kg/m?  ? ?              Constitutional: Pt appears in NAD ?              HENT: Head: NCAT.  ?              Right Ear: External ear normal.   ?              Left Ear: External ear normal.  ?              Eyes: . Pupils are equal, round, and reactive to light. Conjunctivae and EOM are normal ?              Nose: without d/c or deformity ?              Neck: Neck supple. Gross normal ROM ?              Cardiovascular: Normal rate and regular rhythm.   ?              Pulmonary/Chest: Effort normal and breath sounds without rales or wheezing.  ?              Abd:  Soft, NT, ND, + BS, no organomegaly ?              Neurological: Pt is  alert. At baseline orientation, motor grossly intact ?              Skin: Skin is warm. No rashes, no other new lesions, LE edema - trace to 1+ bilat to mid legs ?              Psychiatric: Pt behavior is normal without agitation  ? ?Micro: none ? ?Cardiac tracings I have personally interpreted today:  none ? ?Pertinent Radiological findings (summarize): none  ? ?Lab Results  ?Component Value Date  ? WBC 3.8 (L) 06/30/2021  ? HGB 9.2 (L) 06/30/2021  ? HCT 29.6 (L) 06/30/2021  ? PLT 299.0 06/30/2021  ? GLUCOSE 72 06/30/2021  ? CHOL 181 06/30/2021  ? TRIG 114.0 06/30/2021  ? HDL 59.30 06/30/2021  ? LDLDIRECT 121.7 01/04/2013  ? LDLCALC 99 06/30/2021  ? ALT 25 06/30/2021  ? AST 26 06/30/2021  ? NA 138 06/30/2021  ? K 3.6 06/30/2021  ? CL 102 06/30/2021  ? CREATININE 0.72 06/30/2021  ? BUN 10 06/30/2021  ? CO2 30 06/30/2021  ? TSH 3.23 06/30/2021  ? HGBA1C 5.3 10/27/2017  ? ?Assessment/Plan:  ?Ruth Schmidt is a 51 y.o. Black or African American [2] female with  has a past medical history of Abnormal Pap smear of cervix, Hypertension, and Lumbar herniated disc. ? ?Left lumbar radiculopathy ?Symptoms resolved after 4 injections, exam benign, ok to follow ? ?Menorrhagia ?Persists per pt, pt encouraged to f/u with GYN soon ? ?Encounter for general adult medical examination with abnormal findings ?Age and sex appropriate education and counseling updated with regular exercise and diet ?Referrals for preventative services - for colonoscopy; pt to call for GYN f/u herself ?Immunizations addressed - declines covid booster and shingrix ?Smoking counseling  - none needed ?Evidence for depression or other mood disorder - none significant ?Most recent labs reviewed. ?I have personally reviewed and have noted: ?1) the patient's medical and  social history ?2) The patient's current medications and supplements ?3) The patient's height, weight, and BMI have been recorded in the chart ? ? ?Anemia, iron deficiency ?No recent overt  bleeding except menorrhagia, for f/u iron labs,  to f/u any worsening symptoms or concerns  ? ?Hypertension, uncontrolled ?BP Readings from Last 3 Encounters:  ?06/30/21 138/70  ?03/02/21 (!) 162/98  ?10/19/20 (!) 158/92  ? ?stable, pt to continue medical treatment norvasc, hct ? ? ?Peripheral edema ?etiolgy unclear, possible venous insufficiency vs other, for bmp, also incresaed hct to 25 mg, leg elevation, low salt diet, compression stockings,  to f/u any worsening symptoms or concerns ? ?Vitamin D deficiency ?Last vitamin D ?Lab Results  ?Component Value Date  ? VD25OH 32.67 06/30/2021  ? ?Low, to start oral replacement ? ?Followup: Return in about 6 months (around 12/30/2021). ? ?Oliver BarreJames Barbra Miner, MD 06/30/2021 9:22 PM ?[facility] ?[facility] ?[facility] ?

## 2021-06-30 NOTE — Assessment & Plan Note (Signed)
Age and sex appropriate education and counseling updated with regular exercise and diet ?Referrals for preventative services - for colonoscopy; pt to call for GYN f/u herself ?Immunizations addressed - declines covid booster and shingrix ?Smoking counseling  - none needed ?Evidence for depression or other mood disorder - none significant ?Most recent labs reviewed. ?I have personally reviewed and have noted: ?1) the patient's medical and social history ?2) The patient's current medications and supplements ?3) The patient's height, weight, and BMI have been recorded in the chart ? ?

## 2021-06-30 NOTE — Assessment & Plan Note (Addendum)
No recent overt bleeding except menorrhagia, for f/u iron labs,  to f/u any worsening symptoms or concerns  ?

## 2021-06-30 NOTE — Assessment & Plan Note (Signed)
Persists per pt, pt encouraged to f/u with GYN soon ?

## 2021-06-30 NOTE — Assessment & Plan Note (Signed)
BP Readings from Last 3 Encounters:  ?06/30/21 138/70  ?03/02/21 (!) 162/98  ?10/19/20 (!) 158/92  ? ?stable, pt to continue medical treatment norvasc, hct ? ?

## 2021-06-30 NOTE — Assessment & Plan Note (Signed)
Symptoms resolved after 4 injections, exam benign, ok to follow ?

## 2021-06-30 NOTE — Assessment & Plan Note (Signed)
etiolgy unclear, possible venous insufficiency vs other, for bmp, also incresaed hct to 25 mg, leg elevation, low salt diet, compression stockings,  to f/u any worsening symptoms or concerns ?

## 2021-06-30 NOTE — Patient Instructions (Signed)
Ok to increase the HCt fluid pill to 25 mg for swelling and BP ? ?Please continue all other medications as before, including the amlodipine 5 mg ? ?Please have the pharmacy call with any other refills you may need. ? ?Please continue your efforts at being more active, low cholesterol diet, and weight control. ? ?You are otherwise up to date with prevention measures today. ? ?Please keep your appointments with your specialists as you may have planned ? ?Please go to the LAB at the blood drawing area for the tests to be done ? ?You will be contacted by phone if any changes need to be made immediately.  Otherwise, you will receive a letter about your results with an explanation, but please check with MyChart first. ? ?Please remember to sign up for MyChart if you have not done so, as this will be important to you in the future with finding out test results, communicating by private email, and scheduling acute appointments online when needed. ? ?Please make an Appointment to return in 6 months, or sooner if needed ? ?

## 2021-06-30 NOTE — Assessment & Plan Note (Signed)
Last vitamin D ?Lab Results  ?Component Value Date  ? VD25OH 32.67 06/30/2021  ? ?Low, to start oral replacement ? ?

## 2021-07-27 LAB — HM MAMMOGRAPHY

## 2021-08-06 ENCOUNTER — Encounter: Payer: Self-pay | Admitting: Gastroenterology

## 2021-08-06 ENCOUNTER — Encounter: Payer: Self-pay | Admitting: Internal Medicine

## 2021-08-31 ENCOUNTER — Ambulatory Visit (AMBULATORY_SURGERY_CENTER): Payer: Self-pay

## 2021-08-31 VITALS — Ht 63.0 in | Wt 148.0 lb

## 2021-08-31 DIAGNOSIS — Z1211 Encounter for screening for malignant neoplasm of colon: Secondary | ICD-10-CM

## 2021-08-31 NOTE — Progress Notes (Signed)
No egg or soy allergy known to patient  No issues known to pt with past sedation with any surgeries or procedures Patient denies ever being told they had issues or difficulty with intubation  No FH of Malignant Hyperthermia Pt is not on diet pills Pt is not on  home 02  Pt is not on blood thinners  Pt reports issues with constipation -patient reports she uses suppositories PRN for constipation relief;  No A fib or A flutter NO PA's for preps discussed with pt In PV today  Discussed with pt there will be an out-of-pocket cost for prep and that varies from $0 to 70 +  dollars - pt verbalized understanding  Pt instructed to use Singlecare.com or GoodRx for a price reduction on prep  Pt verified name, DOB, address and insurance during PV today.  Pt given instruction packet to read and not return.  Pt encouraged to call with questions or issues.  Pt has My chart, procedure instructions sent via My Chart

## 2021-09-16 ENCOUNTER — Encounter: Payer: Self-pay | Admitting: Gastroenterology

## 2021-09-24 ENCOUNTER — Ambulatory Visit (AMBULATORY_SURGERY_CENTER): Payer: 59 | Admitting: Gastroenterology

## 2021-09-24 ENCOUNTER — Encounter: Payer: Self-pay | Admitting: Gastroenterology

## 2021-09-24 VITALS — BP 142/92 | HR 80 | Temp 97.5°F | Resp 16 | Ht 63.0 in | Wt 148.0 lb

## 2021-09-24 DIAGNOSIS — K5730 Diverticulosis of large intestine without perforation or abscess without bleeding: Secondary | ICD-10-CM

## 2021-09-24 DIAGNOSIS — D509 Iron deficiency anemia, unspecified: Secondary | ICD-10-CM | POA: Diagnosis present

## 2021-09-24 DIAGNOSIS — K648 Other hemorrhoids: Secondary | ICD-10-CM

## 2021-09-24 DIAGNOSIS — Z1211 Encounter for screening for malignant neoplasm of colon: Secondary | ICD-10-CM

## 2021-09-24 HISTORY — PX: COLONOSCOPY: SHX174

## 2021-09-24 MED ORDER — SODIUM CHLORIDE 0.9 % IV SOLN: 500.0000 mL | Freq: Once | INTRAVENOUS | Status: DC

## 2021-09-24 NOTE — Patient Instructions (Addendum)
Handout on diverticulosis and hemorrhoids provided   Continue current medications.   Repeat colonoscopy in 10 years for screening.  YOU HAD AN ENDOSCOPIC PROCEDURE TODAY AT THE [facility]:   Refer to the procedure report that was given to you for any specific questions about what was found during the examination.  If the procedure report does not answer your questions, please call your gastroenterologist to clarify.  If you requested that your care partner not be given the details of your procedure findings, then the procedure report has been included in a sealed envelope for you to review at your convenience later.  YOU SHOULD EXPECT: Some feelings of bloating in the abdomen. Passage of more gas than usual.  Walking can help get rid of the air that was put into your GI tract during the procedure and reduce the bloating. If you had a lower endoscopy (such as a colonoscopy or flexible sigmoidoscopy) you may notice spotting of blood in your stool or on the toilet paper. If you underwent a bowel prep for your procedure, you may not have a normal bowel movement for a few days.  Please Note:  You might notice some irritation and congestion in your nose or some drainage.  This is from the oxygen used during your procedure.  There is no need for concern and it should clear up in a day or so.  SYMPTOMS TO REPORT IMMEDIATELY:  Following lower endoscopy (colonoscopy or flexible sigmoidoscopy):  Excessive amounts of blood in the stool  Significant tenderness or worsening of abdominal pains  Swelling of the abdomen that is new, acute  Fever of 100F or higher  For urgent or emergent issues, a gastroenterologist can be reached at any hour by calling [phone]. Do not use MyChart messaging for urgent concerns.    DIET:  We do recommend a small meal at first, but then you may proceed to your regular diet.  Drink plenty of fluids but you should avoid alcoholic beverages for 24  hours.  ACTIVITY:  You should plan to take it easy for the rest of today and you should NOT DRIVE or use heavy machinery until tomorrow (because of the sedation medicines used during the test).    FOLLOW UP: Our staff will call the number listed on your records the next business day following your procedure.  We will call around 7:15- 8:00 am to check on you and address any questions or concerns that you may have regarding the information given to you following your procedure. If we do not reach you, we will leave a message.  If you develop any symptoms (ie: fever, flu-like symptoms, shortness of breath, cough etc.) before then, please call [phone].  If you test positive for Covid 19 in the 2 weeks post procedure, please call and report this information to [place].    If any biopsies were taken you will be contacted by phone or by letter within the next 1-3 weeks.  Please call us at [phone] if you have not heard about the biopsies in 3 weeks.    SIGNATURES/CONFIDENTIALITY: You and/or your care partner have signed paperwork which will be entered into your electronic medical record.  These signatures attest to the fact that that the information above on your After Visit Summary has been reviewed and is understood.  Full responsibility of the confidentiality of this discharge information lies with you and/or your care-partner.

## 2021-09-24 NOTE — Op Note (Addendum)
[facility] Patient Name: Ruth Schmidt Procedure Date: 09/24/2021 9:14 AM MRN: [id number] Endoscopist: Viviann Spare P. Adela Lank , MD Age: 51 Referring MD:  Date of Birth: 12-18-1970 Gender: Female Account #: [id number] Procedure:                Colonoscopy Indications:              Colon cancer screening - this is the patient's                            first colonoscopy, Iron deficiency anemia Medicines:                Monitored Anesthesia Care Procedure:                Pre-Anesthesia Assessment:                           - Prior to the procedure, a History and Physical                            was performed, and patient medications and                            allergies were reviewed. The patient's tolerance of                            previous anesthesia was also reviewed. The risks                            and benefits of the procedure and the sedation                            options and risks were discussed with the patient.                            All questions were answered, and informed consent                            was obtained. Prior Anticoagulants: The patient has                            taken no previous anticoagulant or antiplatelet                            agents. ASA Grade Assessment: II - A patient with                            mild systemic disease. After reviewing the risks                            and benefits, the patient was deemed in                            satisfactory condition to undergo the procedure.  After obtaining informed consent, the colonoscope                            was passed under direct vision. Throughout the                            procedure, the patient's blood pressure, pulse, and                            oxygen saturations were monitored continuously. The                            Olympus PCF-H190DL ([id number]) Colonoscope was                            introduced through  the anus and advanced to the the                            cecum, identified by appendiceal orifice and                            ileocecal valve. The colonoscopy was performed                            without difficulty. The patient tolerated the                            procedure well. The quality of the bowel                            preparation was good. The ileocecal valve,                            appendiceal orifice, and rectum were photographed. Scope In: 9:30:37 AM Scope Out: 9:46:59 AM Scope Withdrawal Time: 0 hours 13 minutes 39 seconds  Total Procedure Duration: 0 hours 16 minutes 22 seconds  Findings:                 The perianal and digital rectal examinations were                            normal.                           A few small-mouthed diverticula were found in the                            sigmoid colon.                           Internal hemorrhoids were found during retroflexion.                           The exam was otherwise without abnormality. Of  note, I attempted to intubate the ileum but it was                            rather angulated and despite multiple attempts -                            only limited views seen which were normal. Complications:            No immediate complications. Estimated blood loss:                            None. Estimated Blood Loss:     Estimated blood loss: none. Impression:               - Diverticulosis in the sigmoid colon.                           - Internal hemorrhoids.                           - The examination was otherwise normal.                           - No polyps.                           No cause for iron deficiency on this exam. Recommendation:           - Patient has a contact number available for                            emergencies. The signs and symptoms of potential                            delayed complications were discussed with the                             patient. Return to normal activities tomorrow.                            Written discharge instructions were provided to the                            patient.                           - Resume previous diet.                           - Continue present medications.                           - Repeat colonoscopy in 10 years for screening                            purposes.                           -  Recommend EGD to clear the patient's upper tract                            given no findings on today's exam to account for                            iron deficiency - will discuss with her [name]. [name], MD 09/24/2021 9:52:42 AM This report has been signed electronically.

## 2021-09-24 NOTE — Progress Notes (Signed)
Pt's states no medical or surgical changes since previsit or office visit. 

## 2021-09-24 NOTE — Progress Notes (Signed)
[facility] History and Physical   Primary Care Physician:  [name], MD   Reason for Procedure:   Colon cancer screening  Plan:    colonoscopy     HPI: Ruth Schmidt is a 51 y.o. female  here for colonoscopy screening - first time exam. Patient denies any bowel symptoms at this time. No family history of colon cancer known. Otherwise feels well without any cardiopulmonary symptoms. Of note, on review of chart she has an iron deficiency.    Past Medical History:  Diagnosis Date   Abnormal Pap smear of cervix    Anemia    on iron daily for tx   Hypertension    on med   Lumbar herniated disc     Past Surgical History:  Procedure Laterality Date   CESAREAN SECTION     COLONOSCOPY  09/24/2021   TUBAL LIGATION Bilateral 03/22/2003   UMBILICAL HERNIA REPAIR N/A 10/19/2020   Procedure: UMBILICAL HERNIA REPAIR WITH MESH;  Surgeon: Griselda Miner, MD;  Location: [facility];  Service: General;  Laterality: N/A;    Prior to Admission medications   Medication Sig Start Date End Date Taking? Authorizing Provider  Cholecalciferol (THERA-D 2000) 50 MCG (2000 UT) TABS 1 tab by mouth once daily 07/22/20  Yes [name], MD  gabapentin (NEURONTIN) 100 MG capsule Take 1 capsule (100 mg total) by mouth 3 (three) times daily. 08/24/20  Yes [name], MD  hydrochlorothiazide (HYDRODIURIL) 25 MG tablet Take 25 mg by mouth daily.   Yes [provider]  ferrous sulfate 325 (65 FE) MG tablet Take 1 tablet by mouth daily at 2 am.    [provider]  Multiple Vitamin (MULTI-VITAMIN) tablet Take 1 tablet by mouth daily.    [provider]    Current Outpatient Medications  Medication Sig Dispense Refill   Cholecalciferol (THERA-D 2000) 50 MCG (2000 UT) TABS 1 tab by mouth once daily 30 tablet 99   gabapentin (NEURONTIN) 100 MG capsule Take 1 capsule (100 mg total) by mouth 3 (three) times daily. 90 capsule 5   hydrochlorothiazide  (HYDRODIURIL) 25 MG tablet Take 25 mg by mouth daily.     ferrous sulfate 325 (65 FE) MG tablet Take 1 tablet by mouth daily at 2 am.     Multiple Vitamin (MULTI-VITAMIN) tablet Take 1 tablet by mouth daily.     Current Facility-Administered Medications  Medication Dose Route Frequency Provider Last Rate Last Admin   0.9 %  sodium chloride infusion  500 mL Intravenous Once Alfredo Collymore, [name], MD        Allergies as of 09/24/2021   (No Known Allergies)    Family History  Problem Relation Age of Onset   Hypertension Mother    Prostate cancer Father    Hyperlipidemia Father    Stroke Maternal Aunt 58   Breast cancer Maternal Aunt 68   Lung cancer Maternal Uncle    Colon cancer Maternal Uncle    Hypertension Maternal Grandmother    Coronary artery disease Maternal Grandfather 16       MI, sudden death   Diabetes Paternal Grandmother    Cancer Other    Stomach cancer Neg Hx    Rectal cancer Neg Hx    Esophageal cancer Neg Hx    Colon polyps Neg Hx     Social History   Socioeconomic History   Marital status: Divorced    Spouse name: Not on file  Number of children: 3   Years of education: Not on file   Highest education level: Not on file  Occupational History   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   Tobacco Use   Smoking status: Never   Smokeless tobacco: Never  Vaping Use   Vaping Use: Never used  Substance and Sexual Activity   Alcohol use: Yes    Alcohol/week: 0.0 - 7.0 standard drinks of alcohol    Comment: wine   Drug use: Never   Sexual activity: Yes    Partners: Male    Birth control/protection: Surgical    Comment: Tubal Ligation   Other Topics Concern   Not on file  Social History Narrative   Single parent - 3 kids   Divorced   Self -employed, 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  Physical Exam: Vital signs BP (!) 135/100   Pulse 84   Temp (!) 97.5 F (36.4 C) (Temporal)   Resp 14   Ht 5\' 3"  (1.6 m)   Wt 148 lb (67.1 kg)   LMP 09/03/2021 (Approximate) Comment: Tubal ligation  SpO2 100%   BMI 26.22 kg/m   General:   Alert,  Well-developed, pleasant and cooperative in NAD Lungs:  Clear throughout to auscultation.   Heart:  Regular rate and rhythm Abdomen:  Soft, nontender and nondistended.   Neuro/Psych:  Alert and cooperative. Normal mood and affect. A and O x 3  09/05/2021, MD [facility]

## 2021-09-24 NOTE — Progress Notes (Signed)
PT taken to PACU. Monitors in place. VSS. Report given to RN. 

## 2021-09-25 ENCOUNTER — Inpatient Hospital Stay
Admit: 2021-09-25 | Discharge: 2021-09-25 | Disposition: A | Discharge status: Home / Self Care | Payer: PRIVATE HEALTH INSURANCE

## 2021-09-25 ENCOUNTER — Emergency Department: Admit: 2021-09-25 | Payer: PRIVATE HEALTH INSURANCE | Primary: Family

## 2021-09-25 DIAGNOSIS — R079 Chest pain, unspecified: Principal | ICD-10-CM

## 2021-09-25 LAB — COMPREHENSIVE METABOLIC PANEL
ALT: 17 U/L (ref 12–78)
AST: 20 U/L (ref 15–37)
Albumin/Globulin Ratio: 1 — ABNORMAL LOW (ref 1.1–2.2)
Albumin: 4 g/dL (ref 3.5–5.0)
Alk Phosphatase: 80 U/L (ref 45–117)
Anion Gap: 2 mmol/L — ABNORMAL LOW (ref 5–15)
BUN: 9 MG/DL (ref 6–20)
Bun/Cre Ratio: 9 — ABNORMAL LOW (ref 12–20)
CO2: 31 mmol/L (ref 21–32)
Calcium: 9.5 MG/DL (ref 8.5–10.1)
Chloride: 106 mmol/L (ref 97–108)
Creatinine: 1.01 MG/DL (ref 0.55–1.02)
Est, Glom Filt Rate: >60 mL/min/{1.73_m2} (ref >60)
Globulin: 4.2 g/dL — ABNORMAL HIGH (ref 2.0–4.0)
Glucose: 88 mg/dL (ref 65–100)
Potassium: 3.4 mmol/L — ABNORMAL LOW (ref 3.5–5.1)
Sodium: 139 mmol/L (ref 136–145)
Total Bilirubin: 0.5 MG/DL (ref 0.2–1.0)
Total Protein: 8.2 g/dL (ref 6.4–8.2)

## 2021-09-25 LAB — CBC WITH AUTO DIFFERENTIAL
Absolute Immature Granulocyte: 0 10*3/uL (ref 0.00–0.04)
Basophils %: 1 % (ref 0–1)
Basophils Absolute: 0 10*3/uL (ref 0.0–0.1)
Eosinophils %: 1 % (ref 0–7)
Eosinophils Absolute: 0 10*3/uL (ref 0.0–0.4)
Hematocrit: 37.4 % (ref 35.0–47.0)
Hemoglobin: 12 g/dL (ref 11.5–16.0)
Immature Granulocytes: 0 % (ref 0.0–0.5)
Lymphocytes %: 44 % (ref 12–49)
Lymphocytes Absolute: 1.3 10*3/uL (ref 0.8–3.5)
MCH: 30.5 PG (ref 26.0–34.0)
MCHC: 32.1 g/dL (ref 30.0–36.5)
MCV: 95.2 FL (ref 80.0–99.0)
MPV: 10.8 FL (ref 8.9–12.9)
Monocytes %: 8 % (ref 5–13)
Monocytes Absolute: 0.2 10*3/uL (ref 0.0–1.0)
Neutrophils %: 46 % (ref 32–75)
Neutrophils Absolute: 1.5 10*3/uL — ABNORMAL LOW (ref 1.8–8.0)
Nucleated RBCs: 0 PER 100 WBC
Platelets: 281 10*3/uL (ref 150–400)
RBC: 3.93 M/uL (ref 3.80–5.20)
RDW: 14.5 % (ref 11.5–14.5)
WBC: 3.1 10*3/uL — ABNORMAL LOW (ref 3.6–11.0)
nRBC: 0 10*3/uL (ref 0.00–0.01)

## 2021-09-25 LAB — EXTRA TUBES HOLD

## 2021-09-25 LAB — TROPONIN: Troponin, High Sensitivity: <3 ng/L (ref 0–37)

## 2021-09-25 LAB — D-DIMER, QUANTITATIVE: D-Dimer, Quant: 0.2 mg/L FEU (ref 0.00–0.65)

## 2021-09-25 MED ORDER — CYCLOBENZAPRINE HCL 10 MG PO TABS
10 MG | ORAL_TABLET | Freq: Three times a day (TID) | ORAL | 0 refills | Status: AC | PRN
Start: 2021-09-25 — End: 2021-10-05

## 2021-09-25 MED ORDER — CYCLOBENZAPRINE HCL 10 MG PO TABS
10 MG | ORAL | Status: DC
Start: 2021-09-25 — End: 2021-09-25

## 2021-09-25 MED ORDER — IBUPROFEN 800 MG PO TABS
800 | ORAL_TABLET | Freq: Three times a day (TID) | ORAL | 0 refills | 15.00 days | Status: DC | PRN
Start: 2021-09-25 — End: 2023-08-31

## 2021-09-25 MED ORDER — KETOROLAC TROMETHAMINE 30 MG/ML IJ SOLN
30 MG/ML | INTRAMUSCULAR | Status: DC
Start: 2021-09-25 — End: 2021-09-25

## 2021-09-25 MED FILL — KETOROLAC TROMETHAMINE 30 MG/ML IJ SOLN: 30 MG/ML | INTRAMUSCULAR | Qty: 1

## 2021-09-25 MED FILL — CYCLOBENZAPRINE HCL 10 MG PO TABS: 10 MG | ORAL | Qty: 1

## 2021-09-25 NOTE — ED Notes (Signed)
Pt left ED ambulatory with discharge papers in hand.     Magnus Ivan, LPN  [id number]/[phone]

## 2021-09-25 NOTE — ED Triage Notes (Signed)
Patient reports chest pain, right arm pain, leg pain and neck pain for about two days. She also reports tingling in her arms.

## 2021-09-25 NOTE — ED Provider Notes (Signed)
[facility] EMERGENCY DEP  EMERGENCY DEPARTMENT ENCOUNTER      Pt Name: Michelle Benitez  MRN: [id number]  Birthdate Aug 09, 1970  Date of evaluation: 09/25/2021  Provider: Hewitt Shorts, APRN - NP    CHIEF COMPLAINT       Chief Complaint   Patient presents with    Chest Pain         HISTORY OF PRESENT ILLNESS   (Location/Symptom, Timing/Onset, Context/Setting, Quality, Duration, Modifying Factors, Severity)  Note limiting factors.   Patient is a 51 year old female with past medical history of hypertension, HPV, anemia presenting to the emergency department for evaluation of chest pain.  Symptom onset approximately 2 to 3 days ago.  Reports that she has some pain in the side of her chest that she is unable to describe.  States that it seems to be there constantly, but worsens with movement.  No associated shortness of breath.  Does have some pain in her right neck, radiating down her right arm.  Denies associated headache, dizziness, or other symptoms.  States that she works out frequently and she was at the gym and noted that her heart rate was elevated.  After seeing her elevated heart rate on her watch, she noted some tingling sensation in her extremities.  [facility], who referred here for further evaluation.        The history is provided by the patient.       Review of External Medical Records:     Nursing Notes were reviewed.    REVIEW OF SYSTEMS    (2-9 systems for level 4, 10 or more for level 5)     Review of Systems   Constitutional:  Negative for unexpected weight change.   HENT:  Negative for congestion.    Eyes:  Negative for visual disturbance.   Respiratory:  Negative for cough and shortness of breath.    Cardiovascular:  Positive for chest pain. Negative for palpitations.   Gastrointestinal:  Negative for abdominal pain, nausea and vomiting.   Endocrine: Negative for polyuria.   Genitourinary:  Negative for dysuria and flank pain.   Musculoskeletal:  Positive for myalgias.   Skin:   Negative for pallor.   Allergic/Immunologic: Negative for immunocompromised state.   Neurological:  Positive for numbness. Negative for dizziness, seizures and headaches.   Hematological:  Negative for adenopathy.   Psychiatric/Behavioral:  Negative for agitation.      Except as noted above the remainder of the review of systems was reviewed and negative.       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 Repeat Pap in 1 year    Hypertension     Migraine     None in years, 3y, since have a piercing in upper ear after someone suggested this could help    Vitamin D deficiency 10/14/2013         SURGICAL HISTORY       Past Surgical History:   Procedure Laterality Date    CESAREAN SECTION  1995, 2012    x2    DILATION AND CURETTAGE OF UTERUS  04/14/14    LEEP  02/2013  with colposcopy    OTHER SURGICAL HISTORY Right 2007, 02/2013    Eye surgery, ptyergium removal    TUBAL LIGATION  01/2011         CURRENT MEDICATIONS       Previous Medications    AMLODIPINE (NORVASC) 5 MG TABLET    TAKE 1 TABLET BY MOUTH EVERY DAY  Indications: high blood pressure    INDAPAMIDE (LOZOL) 1.25 MG TABLET    TAKE 1 TABLET BY MOUTH DAILY  Indications: high blood pressure    VALACYCLOVIR (VALTREX) 1 G TABLET    Take 1 tablet by mouth daily       ALLERGIES     Escitalopram, Influenza vaccines, and Lisinopril    FAMILY HISTORY       Family History   Problem Relation Age of Onset    No Known Problems Brother     No Known Problems Daughter     Diabetes Paternal Grandmother     No Known Problems Maternal Grandfather     No Known Problems Brother     Hypertension Sister     Other Father         Just had legs amputated due to blood clots and gangrene, no DM    Cancer Maternal Aunt         cervical?    Cancer  Maternal Grandmother         cervical?    Hypertension Sister     No Known Problems Sister     No Known Problems Son     No Known Problems Son     Hypertension Mother     Breast Cancer Neg Hx     No Known Problems Paternal Grandfather           SOCIAL HISTORY       Social History     Socioeconomic History    Marital status: Single   Tobacco Use    Smoking status: Never    Smokeless tobacco: Never   Substance and Sexual Activity    Alcohol use: Yes     Alcohol/week: 0.0 standard drinks    Drug use: No   Social History Narrative    Lives in [place] with two sons (one in school at [employer]), one dtr (after [place]), mother  Originally from [place]     Earned EdD at [employer] in 2018 in education.            PHYSICAL EXAM    (up to 7 for level 4, 8 or more for level 5)     ED Triage Vitals [09/25/21 1309]   BP Temp Temp Source Pulse Respirations SpO2 Height Weight - Scale   (!) 158/118 98 F (36.7 C) Temporal 78 16 98 % -- 181 lb 10.5 oz (82.4 kg)       Body mass index is 28.45 kg/m.    Physical Exam  Vitals and nursing note reviewed.   Constitutional:       General: She is not in acute distress.     Appearance: Normal appearance. She is normal weight.   Eyes:      Conjunctiva/sclera: Conjunctivae normal.      Pupils: Pupils are equal, round, and reactive to light.   Cardiovascular:      Rate and Rhythm: Normal rate and regular rhythm.      Pulses: Normal pulses.      Heart sounds: Normal heart sounds.   Pulmonary:      Effort: Pulmonary effort  is normal. No respiratory distress.      Breath sounds: Normal breath sounds.   Abdominal:      General: Abdomen is flat. Bowel sounds are normal.   Musculoskeletal:         General: Normal range of motion.      Cervical back: Neck supple.      Right lower leg: No edema.      Left lower leg: No edema.   Skin:     General: Skin is warm and dry.      Capillary Refill: Capillary refill takes less than 2 seconds.   Neurological:      General: No focal deficit present.      Mental Status: She  is alert and oriented to person, place, and time. Mental status is at baseline.   Psychiatric:         Mood and Affect: Mood normal.         Behavior: Behavior normal.           EMERGENCY DEPARTMENT COURSE and DIFFERENTIAL DIAGNOSIS/MDM:   Vitals:    Vitals:    09/25/21 1309   BP: (!) 158/118   Pulse: 78   Resp: 16   Temp: 98 F (36.7 C)   TempSrc: Temporal   SpO2: 98%   Weight: 82.4 kg (181 lb 10.5 oz)           Medical Decision Making  Patient presenting with chest pain, neck pain, numbness and tingling in extremities.  Physical exam revealing pleasant, well-appearing female in no acute distress.  Her pain to be worse when she moves a certain way, making musculoskeletal etiology as possible source.  Other differentials including anxiety, ACS, pulmonary embolism.  Plan to obtain work-up including chest x-ray to evaluate for infiltrate or cardiomegaly, EKG and troponin to rule out ACS.  D-dimer to rule pulmonary embolism.    1550 -chest x-ray interpreted myself and radiologist with no cardiomegaly or other abnormality seen.  Troponin less than 3.  D-dimer nonelevated.  Remainder of labs unremarkable.  Highly suspect muscular etiology.  Will place patient on a few days of anti-inflammatories and muscle relaxers to take as needed.  Refer to cardiology for outpatient echocardiogram/stress. Heart Score 2.  Discussed my clinical impression(s), any labs and/or radiology results with the patient. I answered any questions and addressed any concerns. Discussed the importance of following up with their primary care physician and/or specialist(s). Discussed signs or symptoms that would warrant return back to the ER for further evaluation. The patient is agreeable with discharge.    Amount and/or Complexity of Data Reviewed  Labs: ordered. Decision-making details documented in ED Course.  Radiology: ordered and independent interpretation performed. Decision-making details documented in ED Course.  ECG/medicine tests: ordered  and independent interpretation performed. Decision-making details documented in ED Course.    Risk  Prescription drug management.            REASSESSMENT     ED Course as of 09/25/21 1325   Sat Sep 25, 2021   1314 EKG at 1311-sinus rhythm with first-degree AV block, 62 bpm, no STEMI.  QTc normal.  EKG interpretation by Mellody Drown, MD   [JM]      ED Course User Index  [JM] Mellody Drown, MD         (Please note that portions of this note were completed with a voice recognition program.  Efforts were made to edit the dictations but occasionally words are mis-transcribed.)  Hewitt Shorts, APRN - NP (electronically signed)  Nurse Practitioner      Hewitt Shorts, APRN - NP  09/25/21 1601

## 2021-09-26 LAB — EKG 12-LEAD
Atrial Rate: 62 {beats}/min
P Axis: 47 degrees
P-R Interval: 222 ms
Q-T Interval: 420 ms
QRS Duration: 92 ms
QTc Calculation (Bazett): 426 ms
R Axis: 74 degrees
T Axis: 69 degrees
Ventricular Rate: 62 {beats}/min

## 2021-09-27 ENCOUNTER — Telehealth: Payer: Self-pay | Admitting: *Deleted

## 2021-09-27 NOTE — Telephone Encounter (Signed)
  Follow up Call-     09/24/2021    8:11 AM  Call back number  Post procedure Call Back phone  # [phone]  Permission to leave phone message Yes     Patient questions:  Do you have a fever, pain , or abdominal swelling? No. Pain Score  0 *  Have you tolerated food without any problems? Yes.    Have you been able to return to your normal activities? Yes.    Do you have any questions about your discharge instructions: Diet   No. Medications  No. Follow up visit  No.  Do you have questions or concerns about your Care? No.  Actions: * If pain score is 4 or above: No action needed, pain <4.

## 2021-10-29 ENCOUNTER — Encounter: Payer: Self-pay | Admitting: Gastroenterology

## 2021-11-05 ENCOUNTER — Ambulatory Visit (AMBULATORY_SURGERY_CENTER): Payer: 59 | Admitting: Gastroenterology

## 2021-11-05 ENCOUNTER — Encounter: Payer: Self-pay | Admitting: Gastroenterology

## 2021-11-05 VITALS — BP 160/106 | HR 82 | Temp 98.6°F | Resp 14 | Ht 63.0 in | Wt 148.0 lb

## 2021-11-05 DIAGNOSIS — K449 Diaphragmatic hernia without obstruction or gangrene: Secondary | ICD-10-CM

## 2021-11-05 DIAGNOSIS — K2100 Gastro-esophageal reflux disease with esophagitis, without bleeding: Secondary | ICD-10-CM

## 2021-11-05 DIAGNOSIS — K222 Esophageal obstruction: Secondary | ICD-10-CM | POA: Diagnosis not present

## 2021-11-05 DIAGNOSIS — D509 Iron deficiency anemia, unspecified: Secondary | ICD-10-CM | POA: Diagnosis not present

## 2021-11-05 MED ORDER — SODIUM CHLORIDE 0.9 % IV SOLN: 500.0000 mL | Freq: Once | INTRAVENOUS | Status: DC

## 2021-11-05 MED ORDER — OMEPRAZOLE 20 MG PO CPDR: 20.0000 mg | DELAYED_RELEASE_CAPSULE | Freq: Every day | ORAL | 3 refills | Status: DC

## 2021-11-05 NOTE — Op Note (Signed)
[facility] Patient Name: Ruth Schmidt Procedure Date: 11/05/2021 10:40 AM MRN: [id number] Endoscopist: Viviann Spare P. Adela Lank , MD Age: 51 Referring MD:  Date of Birth: March 31, 1970 Gender: Female Account #: [id number] Procedure:                Upper GI endoscopy Indications:              Iron deficiency anemia - negative colonoscopy                            recently. History of heavy menses which may be                            etiology, but given age, EGD done to clear upper                            tract Medicines:                Monitored Anesthesia Care Procedure:                Pre-Anesthesia Assessment:                           - Prior to the procedure, a History and Physical                            was performed, and patient medications and                            allergies were reviewed. The patient's tolerance of                            previous anesthesia was also reviewed. The risks                            and benefits of the procedure and the sedation                            options and risks were discussed with the patient.                            All questions were answered, and informed consent                            was obtained. Prior Anticoagulants: The patient has                            taken no previous anticoagulant or antiplatelet                            agents. ASA Grade Assessment: II - A patient with                            mild systemic disease. After reviewing the risks  and benefits, the patient was deemed in                            satisfactory condition to undergo the procedure.                           After obtaining informed consent, the endoscope was                            passed under direct vision. Throughout the                            procedure, the patient's blood pressure, pulse, and                            oxygen saturations were monitored continuously. The                             GIF [id number] #[id number] was introduced through the                            mouth, and advanced to the second part of duodenum.                            The upper GI endoscopy was accomplished without                            difficulty. The patient tolerated the procedure                            well. Scope In: Scope Out: Findings:                 Esophagogastric landmarks were identified: the                            Z-line was found at 35 cm, the gastroesophageal                            junction was found at 35 cm and the upper extent of                            the gastric folds was found at 36 cm from the                            incisors.                           A 1 cm hiatal hernia was present.                           LA Grade A esophagitis was found at the GEJ with                            focal  nodularity about 1 cm proximal to the GEJ.                            Biopsies were taken with a cold forceps for                            histology. Mild nonobstructive / widely patent                            Shatski ring noted in this area                           The exam of the esophagus was otherwise normal.                           The entire examined stomach was normal. Biopsies                            were taken with a cold forceps for Helicobacter                            pylori testing.                           The examined duodenum was normal. Complications:            No immediate complications. Estimated blood loss:                            Minimal. Estimated Blood Loss:     Estimated blood loss was minimal. Impression:               - Esophagogastric landmarks identified.                           - 1 cm hiatal hernia.                           - LA Grade A esophagitis with focal nodularity                            which is likely inflammatory. Biopsied.                           - Normal stomach. Biopsied.                            - Normal examined duodenum.                           Suspect menorrhagia is most likely cause of iron                            deficiency but esophagitis noted in the setting of  hiatal hernia. Recommendation:           - Patient has a contact number available for                            emergencies. The signs and symptoms of potential                            delayed complications were discussed with the                            patient. Return to normal activities tomorrow.                            Written discharge instructions were provided to the                            patient.                           - Resume previous diet.                           - Continue present medications.                           - Await pathology results.                           - Trial of omeprazole 20mg  / day for 4 weeks to                            treat esophagitis and then use as needed P. Ailen Strauch, MD 11/05/2021 10:58:10 AM This report has been signed electronically.

## 2021-11-05 NOTE — Progress Notes (Signed)
Called to room to assist during endoscopic procedure.  Patient ID and intended procedure confirmed with present staff. Received instructions for my participation in the procedure from the performing physician.  

## 2021-11-05 NOTE — Patient Instructions (Signed)
Impression/Recommendations:  Esophagitis and hiatal hernia handouts given to patient.  Resume previous diet. Continue present medications. Await pathology results.  Omeprazole 20 a day for 4 weeks to treat esophagitis, then use as needed.  YOU HAD AN ENDOSCOPIC PROCEDURE TODAY AT THE [facility]:   Refer to the procedure report that was given to you for any specific questions about what was found during the examination.  If the procedure report does not answer your questions, please call your gastroenterologist to clarify.  If you requested that your care partner not be given the details of your procedure findings, then the procedure report has been included in a sealed envelope for you to review at your convenience later.  YOU SHOULD EXPECT: Some feelings of bloating in the abdomen. Passage of more gas than usual.  Walking can help get rid of the air that was put into your GI tract during the procedure and reduce the bloating. If you had a lower endoscopy (such as a colonoscopy or flexible sigmoidoscopy) you may notice spotting of blood in your stool or on the toilet paper. If you underwent a bowel prep for your procedure, you may not have a normal bowel movement for a few days.  Please Note:  You might notice some irritation and congestion in your nose or some drainage.  This is from the oxygen used during your procedure.  There is no need for concern and it should clear up in a day or so.  SYMPTOMS TO REPORT IMMEDIATELY:   Following upper endoscopy (EGD)  Vomiting of blood or coffee ground material  New chest pain or pain under the shoulder blades  Painful or persistently difficult swallowing  New shortness of breath  Fever of 100F or higher  Black, tarry-looking stools  For urgent or emergent issues, a gastroenterologist can be reached at any hour by calling [phone]. Do not use MyChart messaging for urgent concerns.    DIET:  We do recommend a small meal at  first, but then you may proceed to your regular diet.  Drink plenty of fluids but you should avoid alcoholic beverages for 24 hours.  ACTIVITY:  You should plan to take it easy for the rest of today and you should NOT DRIVE or use heavy machinery until tomorrow (because of the sedation medicines used during the test).    FOLLOW UP: Our staff will call the number listed on your records the next business day following your procedure.  We will call around 7:15- 8:00 am to check on you and address any questions or concerns that you may have regarding the information given to you following your procedure. If we do not reach you, we will leave a message.  If you develop any symptoms (ie: fever, flu-like symptoms, shortness of breath, cough etc.) before then, please call [phone].  If you test positive for Covid 19 in the 2 weeks post procedure, please call and report this information to [place].    If any biopsies were taken you will be contacted by phone or by letter within the next 1-3 weeks.  Please call us at [phone] if you have not heard about the biopsies in 3 weeks.    SIGNATURES/CONFIDENTIALITY: You and/or your care partner have signed paperwork which will be entered into your electronic medical record.  These signatures attest to the fact that that the information above on your After Visit Summary has been reviewed and is understood.  Full responsibility of the confidentiality of this  discharge information lies with you and/or your care-partner.  

## 2021-11-05 NOTE — Progress Notes (Signed)
Pt's states no medical or surgical changes since previsit or office visit. 

## 2021-11-05 NOTE — Progress Notes (Signed)
[facility] History and Physical   Primary Care Physician:  [name], MD   Reason for Procedure:   Iron deficiency anemia  Plan:    EGD     HPI: Ruth Schmidt is a 51 y.o. female  here for EGD to evaluate IDA. She has a significant iron deficiency anemia on chronic iron. Colonoscopy was negative. She does endorse some heavy menses which could be the etiology but given age EGD recommended to clear her upper tract. Patient denies any bowel symptoms at this time. No abdominal pains. Otherwise feels well without any cardiopulmonary symptoms.   I have discussed risks / benefits of anesthesia and endoscopic procedure with Ruth Schmidt and they wish to proceed with the exams as outlined today.    Past Medical History:  Diagnosis Date   Abnormal Pap smear of cervix    Anemia    on iron daily for tx   Hypertension    on med   Lumbar herniated disc     Past Surgical History:  Procedure Laterality Date   CESAREAN SECTION     COLONOSCOPY  09/24/2021   TUBAL LIGATION Bilateral 03/22/2003   UMBILICAL HERNIA REPAIR N/A 10/19/2020   Procedure: UMBILICAL HERNIA REPAIR WITH MESH;  Surgeon: Griselda Miner, MD;  Location: [facility];  Service: General;  Laterality: N/A;    Prior to Admission medications   Medication Sig Start Date End Date Taking? Authorizing Provider  Cholecalciferol (THERA-D 2000) 50 MCG (2000 UT) TABS 1 tab by mouth once daily 07/22/20  Yes [name], MD  gabapentin (NEURONTIN) 100 MG capsule Take 1 capsule (100 mg total) by mouth 3 (three) times daily. 08/24/20  Yes [name], MD  hydrochlorothiazide (HYDRODIURIL) 25 MG tablet Take 25 mg by mouth daily.   Yes [provider]  Multiple Vitamin (MULTI-VITAMIN) tablet Take 1 tablet by mouth daily.   Yes [provider]  ferrous sulfate 325 (65 FE) MG tablet Take 1 tablet by mouth daily at 2 am.    [provider]    Current Outpatient Medications   Medication Sig Dispense Refill   Cholecalciferol (THERA-D 2000) 50 MCG (2000 UT) TABS 1 tab by mouth once daily 30 tablet 99   gabapentin (NEURONTIN) 100 MG capsule Take 1 capsule (100 mg total) by mouth 3 (three) times daily. 90 capsule 5   hydrochlorothiazide (HYDRODIURIL) 25 MG tablet Take 25 mg by mouth daily.     Multiple Vitamin (MULTI-VITAMIN) tablet Take 1 tablet by mouth daily.     ferrous sulfate 325 (65 FE) MG tablet Take 1 tablet by mouth daily at 2 am.     Current Facility-Administered Medications  Medication Dose Route Frequency Provider Last Rate Last Admin   0.9 %  sodium chloride infusion  500 mL Intravenous Once Eria Lozoya, [name], MD        Allergies as of 11/05/2021   (No Known Allergies)    Family History  Problem Relation Age of Onset   Hypertension Mother    Prostate cancer Father    Hyperlipidemia Father    Stroke Maternal Aunt 83   Breast cancer Maternal Aunt 68   Lung cancer Maternal Uncle    Colon cancer Maternal Uncle    Hypertension Maternal Grandmother    Coronary artery disease Maternal Grandfather 39       MI, sudden death   Diabetes Paternal Grandmother    Cancer Other    Stomach cancer Neg Hx  Rectal cancer Neg Hx    Esophageal cancer Neg Hx    Colon polyps Neg Hx     Social History   Socioeconomic History   Marital status: Divorced    Spouse name: Not on file   Number of children: 3   Years of education: Not on file   Highest education level: Not on file  Occupational History   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   Tobacco Use   Smoking status: Never   Smokeless tobacco: Never  Vaping Use   Vaping Use: Never used  Substance and Sexual Activity   Alcohol use: Yes    Alcohol/week: 0.0 - 7.0 standard drinks of alcohol    Comment: wine   Drug use: Never   Sexual activity: Yes    Partners: Male    Birth control/protection: Surgical    Comment: Tubal Ligation   Other Topics Concern   Not on file  Social  History Narrative   Single parent - 3 kids   Divorced   Self -employed, 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  Physical Exam: Vital signs BP (!) 149/97   Pulse 88   Temp 98.6 F (37 C) (Temporal)   Ht 5\' 3"  (1.6 m)   Wt 148 lb (67.1 kg)   SpO2 100%   BMI 26.22 kg/m   General:   Alert,  Well-developed, pleasant and cooperative in NAD Lungs:  Clear throughout to auscultation.   Heart:  Regular rate and rhythm Abdomen:  Soft, nontender and nondistended.   Neuro/Psych:  Alert and cooperative. Normal mood and affect. A and O x 3  , MD [facility]

## 2021-11-05 NOTE — Progress Notes (Signed)
Sedate, gd SR, tolerated procedure well, VSS, report to RN 

## 2021-11-05 NOTE — Progress Notes (Signed)
Pt. Did not take her BP med this a.m.  Told pt. To take it as soon as she gets home.

## 2021-11-08 ENCOUNTER — Telehealth: Payer: Self-pay | Admitting: *Deleted

## 2021-11-08 NOTE — Telephone Encounter (Signed)
  Follow up Call-     11/05/2021    9:45 AM 09/24/2021    8:11 AM  Call back number  Post procedure Call Back phone  # [phone]  Permission to leave phone message Yes Yes     Patient questions:  Do you have a fever, pain , or abdominal swelling? No. Pain Score  0 *  Have you tolerated food without any problems? Yes.    Have you been able to return to your normal activities? Yes.    Do you have any questions about your discharge instructions: Diet   No. Medications  No. Follow up visit  No.  Do you have questions or concerns about your Care? No.  Actions: * If pain score is 4 or above: No action needed, pain <4.

## 2021-11-11 ENCOUNTER — Other Ambulatory Visit: Payer: Self-pay

## 2021-11-11 DIAGNOSIS — D509 Iron deficiency anemia, unspecified: Secondary | ICD-10-CM

## 2021-11-17 ENCOUNTER — Telehealth: Payer: Self-pay | Admitting: Gastroenterology

## 2021-11-17 NOTE — Telephone Encounter (Signed)
Returned call to patient. See 11/05/21 pathology result note for details.

## 2021-11-17 NOTE — Telephone Encounter (Signed)
Patient returned your call, please advise. 

## 2021-12-02 ENCOUNTER — Telehealth: Payer: Self-pay | Admitting: Gastroenterology

## 2021-12-02 ENCOUNTER — Other Ambulatory Visit (INDEPENDENT_AMBULATORY_CARE_PROVIDER_SITE_OTHER): Payer: 59

## 2021-12-02 DIAGNOSIS — D509 Iron deficiency anemia, unspecified: Secondary | ICD-10-CM | POA: Diagnosis not present

## 2021-12-02 LAB — IBC + FERRITIN
Ferritin: 2.9 ng/mL — ABNORMAL LOW (ref 10.0–291.0)
Iron: 17 ug/dL — ABNORMAL LOW (ref 42–145)
Saturation Ratios: 3.4 % — ABNORMAL LOW (ref 20.0–50.0)
TIBC: 495.6 ug/dL — ABNORMAL HIGH (ref 250.0–450.0)
Transferrin: 354 mg/dL (ref 212.0–360.0)

## 2021-12-02 LAB — CBC WITH DIFFERENTIAL/PLATELET
Basophils Relative: 0 % (ref 0.0–3.0)
Eosinophils Relative: 0 % (ref 0.0–5.0)
HCT: 24.1 % — ABNORMAL LOW (ref 36.0–46.0)
Hemoglobin: 7.3 g/dL — CL (ref 12.0–15.0)
Lymphocytes Relative: 30 % (ref 12.0–46.0)
MCHC: 30.1 g/dL (ref 30.0–36.0)
MCV: 69.8 fl — ABNORMAL LOW (ref 78.0–100.0)
Monocytes Relative: 6 % (ref 3.0–12.0)
Neutrophils Relative %: 64 % (ref 43.0–77.0)
Platelets: 307 10*3/uL (ref 150.0–400.0)
RBC: 3.46 Mil/uL — ABNORMAL LOW (ref 3.87–5.11)
RDW: 18.7 % — ABNORMAL HIGH (ref 11.5–15.5)
WBC: 3.3 10*3/uL — ABNORMAL LOW (ref 4.0–10.5)

## 2021-12-02 NOTE — Telephone Encounter (Signed)
Received a call form Zaray in Lab with a critical Lab-Hemoglobin of 7.3. message routed to and verbally relayed to Dr. Adela Lank. Please advise.

## 2021-12-02 NOTE — Telephone Encounter (Signed)
I called the patient with results of her labs.  Recommendations per result note.

## 2021-12-03 ENCOUNTER — Telehealth: Payer: Self-pay | Admitting: Hematology and Oncology

## 2021-12-03 ENCOUNTER — Other Ambulatory Visit: Payer: Self-pay

## 2021-12-03 DIAGNOSIS — D509 Iron deficiency anemia, unspecified: Secondary | ICD-10-CM

## 2021-12-03 NOTE — Telephone Encounter (Signed)
Scheduled appt per 9/15 referral. Pt is aware of appt date and time. Pt is aware to arrive 15 mins prior to appt time and to bring and updated insurance card. Pt is aware of appt location.   

## 2021-12-03 NOTE — Telephone Encounter (Signed)
Noted  

## 2021-12-09 ENCOUNTER — Other Ambulatory Visit: Payer: Self-pay

## 2021-12-09 ENCOUNTER — Telehealth: Payer: Self-pay

## 2021-12-09 ENCOUNTER — Inpatient Hospital Stay: Payer: 59

## 2021-12-09 ENCOUNTER — Inpatient Hospital Stay: Payer: 59 | Admitting: Hematology and Oncology

## 2021-12-09 ENCOUNTER — Inpatient Hospital Stay: Payer: Self-pay | Attending: Hematology and Oncology | Admitting: Hematology and Oncology

## 2021-12-09 VITALS — BP 155/84 | HR 98 | Temp 97.3°F | Resp 18 | Ht 63.0 in | Wt 147.9 lb

## 2021-12-09 DIAGNOSIS — D509 Iron deficiency anemia, unspecified: Secondary | ICD-10-CM | POA: Diagnosis not present

## 2021-12-09 DIAGNOSIS — Z79899 Other long term (current) drug therapy: Secondary | ICD-10-CM | POA: Diagnosis not present

## 2021-12-09 DIAGNOSIS — D500 Iron deficiency anemia secondary to blood loss (chronic): Secondary | ICD-10-CM | POA: Diagnosis not present

## 2021-12-09 NOTE — Telephone Encounter (Signed)
-----   Message from Yevette Nand, RN sent at 12/03/2021  9:31 AM EDT ----- Regarding: Labs CBC - order is in epic

## 2021-12-09 NOTE — Assessment & Plan Note (Signed)
Lab review 12/02/2021: WBC 3.3, hemoglobin 7.3, MCV 69.8, RDW 18.7, ferritin 2.9, TIBC 495, iron saturation 3.4%  Iron deficiency anemia: I discussed with the patient the process of iron absorption. I counseled extensively regarding the different causes of iron deficiency including blood loss and malabsorption. Patient has had upper endoscopies and colonoscopies and did not have any clear identified source of blood loss. It is possible that the patient may still have an occult source of bleeding. The most likely cardiac failure not e for her anemia is heavy menstrual bleeding.  Recommendation: 1. Stop oral iron 2. proceed with IV iron infusions  I discussed with the patient that potentially there may be a need for additional IV iron infusions if the iron levels were to remain low in the future. The frequency of need of IV iron would depend on many other factors including the rate of loss and by the degree of absorption.  Return to clinic in 3 months with recheck on iron studies and hemoglobin.

## 2021-12-09 NOTE — Telephone Encounter (Signed)
Called pt to remind her of repeat labs. Pt's voicemail is full and I was unable to leave a message. Pt has an appt with Hematology today at 1 pm and they will likely draw labs as well.

## 2021-12-09 NOTE — Telephone Encounter (Signed)
Patient has been set up for Venofer infusions.

## 2021-12-09 NOTE — Progress Notes (Signed)
[facility] CONSULT NOTE  Patient Care Team: [name], MD as PCP - General (Internal Medicine) Antionette Char, MD (Obstetrics and Gynecology)  CHIEF COMPLAINTS/PURPOSE OF CONSULTATION:  Iron deficiency anemia  HISTORY OF PRESENTING ILLNESS:  Ruth Schmidt 51 y.o. female is here because of recent diagnosis of Iron deficiency anemia.  She has known iron deficiency anemia for the past 5 years because of heavy menstrual bleeding.  However lately things have gotten much worse most recent blood work showed a hemoglobin of 7.3 with near absent ferritin levels.  She has been feeling dizziness and lightheadedness when she stands up as well as cravings for ice chips.  She was referred to [place] for discussion regarding treatment options.  She had an upper endoscopy and colonoscopy recently both of which were negative for bleeding.  I reviewed her records extensively and collaborated the history with the patient.     MEDICAL HISTORY:  Past Medical History:  Diagnosis Date   Abnormal Pap smear of cervix    Anemia    on iron daily for tx   Hypertension    on med   Lumbar herniated disc     SURGICAL HISTORY: Past Surgical History:  Procedure Laterality Date   CESAREAN SECTION     COLONOSCOPY  09/24/2021   TUBAL LIGATION Bilateral 03/22/2003   UMBILICAL HERNIA REPAIR N/A 10/19/2020   Procedure: UMBILICAL HERNIA REPAIR WITH MESH;  Surgeon: Griselda Miner, MD;  Location: [facility];  Service: General;  Laterality: N/A;    SOCIAL HISTORY: Social History   Socioeconomic History   Marital status: Divorced    Spouse name: Not on file   Number of children: 3   Years of education: Not on file   Highest education level: Not on file  Occupational History   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   Tobacco Use   Smoking status: Never   Smokeless tobacco: Never  Vaping Use   Vaping Use: Never used  Substance and Sexual Activity   Alcohol  use: Yes    Alcohol/week: 0.0 - 7.0 standard drinks of alcohol    Comment: wine   Drug use: Never   Sexual activity: Yes    Partners: Male    Birth control/protection: Surgical    Comment: Tubal Ligation   Other Topics Concern   Not on file  Social History Narrative   Single parent - 3 kids   Divorced   Self -employed, 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)   Social Determinants of Corporate investment banker Strain: Not on file  Food Insecurity: Not on file  Transportation Needs: Not on file  Physical Activity: Not on file  Stress: Not on file  Social Connections: Not on file  Intimate Partner Violence: Not on file    FAMILY HISTORY: Family History  Problem Relation Age of Onset   Hypertension Mother    Prostate cancer Father    Hyperlipidemia Father    Stroke Maternal Aunt 56   Breast cancer Maternal Aunt 68   Lung cancer Maternal Uncle    Colon cancer Maternal Uncle    Hypertension Maternal Grandmother    Coronary artery disease Maternal Grandfather 58       MI, sudden death   Diabetes Paternal Grandmother    Cancer Other    Stomach cancer Neg Hx    Rectal cancer Neg Hx    Esophageal cancer Neg Hx    Colon  polyps Neg Hx     ALLERGIES:  has No Known Allergies.  MEDICATIONS:  Current Outpatient Medications  Medication Sig Dispense Refill   Cholecalciferol (THERA-D 2000) 50 MCG (2000 UT) TABS 1 tab by mouth once daily 30 tablet 99   ferrous sulfate 325 (65 FE) MG tablet Take 1 tablet by mouth daily at 2 am.     gabapentin (NEURONTIN) 100 MG capsule Take 1 capsule (100 mg total) by mouth 3 (three) times daily. 90 capsule 5   hydrochlorothiazide (HYDRODIURIL) 25 MG tablet Take 25 mg by mouth daily.     Multiple Vitamin (MULTI-VITAMIN) tablet Take 1 tablet by mouth daily.     omeprazole (PRILOSEC) 20 MG capsule Take 1 capsule (20 mg total) by mouth daily. 30 capsule 3   Current Facility-Administered Medications  Medication Dose Route Frequency  Provider Last Rate Last Admin   0.9 %  sodium chloride infusion  500 mL Intravenous Once Armbruster, Carlota Raspberry, MD        REVIEW OF SYSTEMS:   Constitutional: Denies fevers, chills or abnormal night sweats Cravings for ice chips and dizziness and lightheadedness to standing up All other systems were reviewed with the patient and are negative.  PHYSICAL EXAMINATION: ECOG PERFORMANCE STATUS: 1 - Symptomatic but completely ambulatory  Vitals:   12/09/21 1316  BP: (!) 155/84  Pulse: 98  Resp: 18  Temp: (!) 97.3 F (36.3 C)  SpO2: 100%   Filed Weights   12/09/21 1316  Weight: 147 lb 14.4 oz (67.1 kg)    GENERAL:alert, no distress and comfortable    LABORATORY DATA:  I have reviewed the data as listed Lab Results  Component Value Date   WBC 3.3 (L) 12/02/2021   HGB 7.3 Repeated and verified X2. (LL) 12/02/2021   HCT 24.1 (L) 12/02/2021   MCV 69.8 Repeated and verified X2. (L) 12/02/2021   PLT 307.0 12/02/2021   Lab Results  Component Value Date   NA 138 06/30/2021   K 3.6 06/30/2021   CL 102 06/30/2021   CO2 30 06/30/2021    RADIOGRAPHIC STUDIES: I have personally reviewed the radiological reports and agreed with the findings in the report.  ASSESSMENT AND PLAN:  Anemia, iron deficiency Lab review 12/02/2021: WBC 3.3, hemoglobin 7.3, MCV 69.8, RDW 18.7, ferritin 2.9, TIBC 495, iron saturation 3.4%  Iron deficiency anemia: I discussed with the patient the process of iron absorption. I counseled extensively regarding the different causes of iron deficiency including blood loss and malabsorption. Patient has had upper endoscopies and colonoscopies and did not have any clear identified source of blood loss. It is possible that the patient may still have an occult source of bleeding. The most likely cause of her anemia is heavy menstrual bleeding.  Recommendation: 1. Stop oral iron 2. proceed with IV iron infusions  I discussed with the patient that potentially  there may be a need for additional IV iron infusions if the iron levels were to remain low in the future. The frequency of need of IV iron would depend on many other factors including the rate of loss and by the degree of absorption.  Return to clinic in 3 months with recheck on iron studies and hemoglobin.    All questions were answered. The patient knows to call the clinic with any problems, questions or concerns.    Harriette Ohara, MD 12/09/21

## 2021-12-15 ENCOUNTER — Inpatient Hospital Stay: Payer: 59

## 2021-12-15 ENCOUNTER — Other Ambulatory Visit: Payer: Self-pay

## 2021-12-15 VITALS — BP 141/96 | HR 86 | Temp 98.6°F | Resp 18

## 2021-12-15 DIAGNOSIS — D509 Iron deficiency anemia, unspecified: Secondary | ICD-10-CM | POA: Diagnosis not present

## 2021-12-15 DIAGNOSIS — D500 Iron deficiency anemia secondary to blood loss (chronic): Secondary | ICD-10-CM

## 2021-12-15 MED ORDER — SODIUM CHLORIDE 0.9 % IV SOLN
300.0000 mg | Freq: Once | INTRAVENOUS | Status: AC
  Administered 2021-12-15: 300 mg via INTRAVENOUS
  Filled 2021-12-15: qty 300

## 2021-12-15 MED ORDER — ACETAMINOPHEN 325 MG PO TABS
650.0000 mg | ORAL_TABLET | Freq: Once | ORAL | Status: AC
  Administered 2021-12-15: 650 mg via ORAL
  Filled 2021-12-15: qty 2

## 2021-12-15 MED ORDER — SODIUM CHLORIDE 0.9 % IV SOLN: Freq: Once | INTRAVENOUS | Status: AC

## 2021-12-15 MED ORDER — LORATADINE 10 MG PO TABS
10.0000 mg | ORAL_TABLET | Freq: Once | ORAL | Status: AC
  Administered 2021-12-15: 10 mg via ORAL
  Filled 2021-12-15: qty 1

## 2021-12-15 NOTE — Progress Notes (Signed)
Pt tolerated IV iron infusion well. Monitored for 30 minute post observation period. VSS, ambulatory to lobby. 

## 2021-12-15 NOTE — Patient Instructions (Addendum)
Iron Sucrose Injection What is this medication? IRON SUCROSE (EYE ern SOO krose) treats low levels of iron (iron deficiency anemia) in people with kidney disease. Iron is a mineral that plays an important role in making red blood cells, which carry oxygen from your lungs to the rest of your body. This medicine may be used for other purposes; ask your health care provider or pharmacist if you have questions. COMMON BRAND NAME(S): Venofer What should I tell my care team before I take this medication? They need to know if you have any of these conditions: Anemia not caused by low iron levels Heart disease High levels of iron in the blood Kidney disease Liver disease An unusual or allergic reaction to iron, other medications, foods, dyes, or preservatives Pregnant or trying to get pregnant Breast-feeding How should I use this medication? This medication is for infusion into a vein. It is given in a hospital or clinic setting. Talk to your care team about the use of this medication in children. While this medication may be prescribed for children as young as 2 years for selected conditions, precautions do apply. Overdosage: If you think you have taken too much of this medicine contact a poison control center or emergency room at once. NOTE: This medicine is only for you. Do not share this medicine with others. What if I miss a dose? It is important not to miss your dose. Call your care team if you are unable to keep an appointment. What may interact with this medication? Do not take this medication with any of the following: Deferoxamine Dimercaprol Other iron products This medication may also interact with the following: Chloramphenicol Deferasirox This list may not describe all possible interactions. Give your health care provider a list of all the medicines, herbs, non-prescription drugs, or dietary supplements you use. Also tell them if you smoke, drink alcohol, or use illegal drugs.  Some items may interact with your medicine. What should I watch for while using this medication? Visit your care team regularly. Tell your care team if your symptoms do not start to get better or if they get worse. You may need blood work done while you are taking this medication. You may need to follow a special diet. Talk to your care team. Foods that contain iron include: whole grains/cereals, dried fruits, beans, or peas, leafy green vegetables, and organ meats (liver, kidney). What side effects may I notice from receiving this medication? Side effects that you should report to your care team as soon as possible: Allergic reactions--skin rash, itching, hives, swelling of the face, lips, tongue, or throat Low blood pressure--dizziness, feeling faint or lightheaded, blurry vision Shortness of breath Side effects that usually do not require medical attention (report to your care team if they continue or are bothersome): Flushing Headache Joint pain Muscle pain Nausea Pain, redness, or irritation at injection site This list may not describe all possible side effects. Call your doctor for medical advice about side effects. You may report side effects to FDA at 1-800-FDA-1088. Where should I keep my medication? This medication is given in a hospital or clinic and will not be stored at home. NOTE: This sheet is a summary. It may not cover all possible information. If you have questions about this medicine, talk to your doctor, pharmacist, or health care provider.  2023 Elsevier/Gold Standard (2007-04-28 00:00:00)  Iron Deficiency Anemia, Adult  Iron deficiency anemia is when you do not have enough red blood cells or hemoglobin in your   blood. This happens because you have too little iron in your body. Hemoglobin carries oxygen to parts of the body. Anemia can cause your body to not get enough oxygen. What are the causes? Not eating enough foods that have iron in them. The body not being able  to take in iron well. Blood loss. What increases the risk? Having menstrual periods. Being pregnant. What are the signs or symptoms? Pale skin, lips, and nails. Weakness, dizziness, and getting tired easily. Feeling like you cannot breathe well when moving (shortness of breath). Cold hands and feet. Mild anemia may not cause any symptoms. How is this treated? This condition is treated by finding out why you do not have enough iron and then getting more iron. It may include: Adding foods to your diet that have a lot of iron. Taking iron pills (supplements). If you are pregnant or breastfeeding, you may need to take extra iron. Your diet often does not provide the amount of iron that you need. Getting more vitamin C in your diet. Vitamin C helps your body take in iron. You may need to take iron pills with a glass of orange juice or vitamin C pills. Medicines to make heavy menstrual periods lighter. Surgery or testing procedures to find what is causing the condition. You may need blood tests to see if treatment is working. If the treatment does not seem to be working, you may need more tests. Follow these instructions at home: Medicines Take over-the-counter and prescription medicines only as told by your doctor. This includes iron pills and vitamins. Taking them as told is important because too much iron can be harmful. Take iron pills when your stomach is empty. If you cannot handle this, take them with food. Do not drink milk or take antacids at the same time as your iron pills. Iron pills may turn your poop (stool)black. If you cannot handle taking iron pills by mouth, ask your doctor about getting iron through: An IV tube. A shot (injection) into a muscle. Eating and drinking Talk with your doctor before changing the foods you eat. Your doctor may tell you to eat foods that have a lot of iron, such as: Liver. Low-fat (lean) beef. Breads and cereals that have iron added to  them. Eggs. Dried fruit. Dark green, leafy vegetables. Eat fresh fruits and vegetables that are high in vitamin C. They help your body use iron. Foods with a lot of vitamin C include: Oranges. Peppers. Tomatoes. Mangoes. Managing constipation If you are taking iron pills, they may cause trouble pooping (constipation). To prevent or treat this, you may need to: Drink enough fluid to keep your pee (urine) pale yellow. Take over-the-counter or prescription medicines. Eat foods that are high in fiber. These include beans, whole grains, and fresh fruits and vegetables. Limit foods that are high in fat and sugar. These include fried or sweet foods. General instructions Return to your normal activities when your doctor says that it is safe. Keep all follow-up visits. Contact a doctor if: You feel like you may vomit (nauseous), or you vomit. You feel weak. You get light-headed when getting up from sitting or lying down. You are sweating for no reason. You have trouble pooping. You have worse breathing with physical activity. You have heaviness in your chest. Get help right away if: You faint. If this happens, do not drive yourself to the hospital. You have a fast heartbeat, or a heartbeat that does not feel regular. Summary Iron deficiency anemia happens  when you have too little iron in your body. This condition is treated by finding out why you do not have enough iron in your body and then getting more iron. Take over-the-counter and prescription medicines only as told by your doctor. Eat fresh fruits and vegetables that are high in vitamin C. Contact a doctor if you have trouble pooping or feel weak. This information is not intended to replace advice given to you by your health care provider. Make sure you discuss any questions you have with your health care provider. Document Revised: 04/15/2021 Document Reviewed: 04/15/2021 Elsevier Patient Education  2023 Elsevier Inc.  

## 2021-12-22 ENCOUNTER — Other Ambulatory Visit: Payer: Self-pay

## 2021-12-22 ENCOUNTER — Inpatient Hospital Stay: Payer: 59 | Attending: Hematology and Oncology

## 2021-12-22 VITALS — BP 126/83 | HR 85 | Temp 98.4°F | Resp 16

## 2021-12-22 DIAGNOSIS — D500 Iron deficiency anemia secondary to blood loss (chronic): Secondary | ICD-10-CM

## 2021-12-22 DIAGNOSIS — D509 Iron deficiency anemia, unspecified: Secondary | ICD-10-CM | POA: Diagnosis not present

## 2021-12-22 MED ORDER — SODIUM CHLORIDE 0.9 % IV SOLN: Freq: Once | INTRAVENOUS | Status: AC

## 2021-12-22 MED ORDER — ACETAMINOPHEN 325 MG PO TABS
650.0000 mg | ORAL_TABLET | Freq: Once | ORAL | Status: AC
  Administered 2021-12-22: 650 mg via ORAL
  Filled 2021-12-22: qty 2

## 2021-12-22 MED ORDER — LORATADINE 10 MG PO TABS
10.0000 mg | ORAL_TABLET | Freq: Once | ORAL | Status: AC
  Administered 2021-12-22: 10 mg via ORAL
  Filled 2021-12-22: qty 1

## 2021-12-22 MED ORDER — SODIUM CHLORIDE 0.9 % IV SOLN
300.0000 mg | Freq: Once | INTRAVENOUS | Status: AC
  Administered 2021-12-22: 300 mg via INTRAVENOUS
  Filled 2021-12-22: qty 300

## 2021-12-22 NOTE — Progress Notes (Signed)
Patient states that she feels "back to normal" and stable for discharge. VSS and patient ambulatory at discharge.

## 2021-12-22 NOTE — Progress Notes (Signed)
During patients 30 minutes post venofer infusion patient started to complain of left hand "tightness." Blood return noted from IV site and IV flushed properly. No redness or swelling noted to IV site. Ruth Schmidt, [place] educated patient on signs and symptoms to be aware of following post observation and recommendations for further treatments.

## 2021-12-22 NOTE — Patient Instructions (Signed)
Iron Sucrose Injection What is this medication? IRON SUCROSE (EYE ern SOO krose) treats low levels of iron (iron deficiency anemia) in people with kidney disease. Iron is a mineral that plays an important role in making red blood cells, which carry oxygen from your lungs to the rest of your body. This medicine may be used for other purposes; ask your health care provider or pharmacist if you have questions. COMMON BRAND NAME(S): Venofer What should I tell my care team before I take this medication? They need to know if you have any of these conditions: Anemia not caused by low iron levels Heart disease High levels of iron in the blood Kidney disease Liver disease An unusual or allergic reaction to iron, other medications, foods, dyes, or preservatives Pregnant or trying to get pregnant Breast-feeding How should I use this medication? This medication is for infusion into a vein. It is given in a hospital or clinic setting. Talk to your care team about the use of this medication in children. While this medication may be prescribed for children as young as 2 years for selected conditions, precautions do apply. Overdosage: If you think you have taken too much of this medicine contact a poison control center or emergency room at once. NOTE: This medicine is only for you. Do not share this medicine with others. What if I miss a dose? It is important not to miss your dose. Call your care team if you are unable to keep an appointment. What may interact with this medication? Do not take this medication with any of the following: Deferoxamine Dimercaprol Other iron products This medication may also interact with the following: Chloramphenicol Deferasirox This list may not describe all possible interactions. Give your health care provider a list of all the medicines, herbs, non-prescription drugs, or dietary supplements you use. Also tell them if you smoke, drink alcohol, or use illegal drugs.  Some items may interact with your medicine. What should I watch for while using this medication? Visit your care team regularly. Tell your care team if your symptoms do not start to get better or if they get worse. You may need blood work done while you are taking this medication. You may need to follow a special diet. Talk to your care team. Foods that contain iron include: whole grains/cereals, dried fruits, beans, or peas, leafy green vegetables, and organ meats (liver, kidney). What side effects may I notice from receiving this medication? Side effects that you should report to your care team as soon as possible: Allergic reactions--skin rash, itching, hives, swelling of the face, lips, tongue, or throat Low blood pressure--dizziness, feeling faint or lightheaded, blurry vision Shortness of breath Side effects that usually do not require medical attention (report to your care team if they continue or are bothersome): Flushing Headache Joint pain Muscle pain Nausea Pain, redness, or irritation at injection site This list may not describe all possible side effects. Call your doctor for medical advice about side effects. You may report side effects to FDA at 1-800-FDA-1088. Where should I keep my medication? This medication is given in a hospital or clinic and will not be stored at home. NOTE: This sheet is a summary. It may not cover all possible information. If you have questions about this medicine, talk to your doctor, pharmacist, or health care provider.  2023 Elsevier/Gold Standard (2007-04-28 00:00:00)  Iron Deficiency Anemia, Adult  Iron deficiency anemia is when you do not have enough red blood cells or hemoglobin in your  blood. This happens because you have too little iron in your body. Hemoglobin carries oxygen to parts of the body. Anemia can cause your body to not get enough oxygen. What are the causes? Not eating enough foods that have iron in them. The body not being able  to take in iron well. Blood loss. What increases the risk? Having menstrual periods. Being pregnant. What are the signs or symptoms? Pale skin, lips, and nails. Weakness, dizziness, and getting tired easily. Feeling like you cannot breathe well when moving (shortness of breath). Cold hands and feet. Mild anemia may not cause any symptoms. How is this treated? This condition is treated by finding out why you do not have enough iron and then getting more iron. It may include: Adding foods to your diet that have a lot of iron. Taking iron pills (supplements). If you are pregnant or breastfeeding, you may need to take extra iron. Your diet often does not provide the amount of iron that you need. Getting more vitamin C in your diet. Vitamin C helps your body take in iron. You may need to take iron pills with a glass of orange juice or vitamin C pills. Medicines to make heavy menstrual periods lighter. Surgery or testing procedures to find what is causing the condition. You may need blood tests to see if treatment is working. If the treatment does not seem to be working, you may need more tests. Follow these instructions at home: Medicines Take over-the-counter and prescription medicines only as told by your doctor. This includes iron pills and vitamins. Taking them as told is important because too much iron can be harmful. Take iron pills when your stomach is empty. If you cannot handle this, take them with food. Do not drink milk or take antacids at the same time as your iron pills. Iron pills may turn your poop (stool)black. If you cannot handle taking iron pills by mouth, ask your doctor about getting iron through: An IV tube. A shot (injection) into a muscle. Eating and drinking Talk with your doctor before changing the foods you eat. Your doctor may tell you to eat foods that have a lot of iron, such as: Liver. Low-fat (lean) beef. Breads and cereals that have iron added to  them. Eggs. Dried fruit. Dark green, leafy vegetables. Eat fresh fruits and vegetables that are high in vitamin C. They help your body use iron. Foods with a lot of vitamin C include: Oranges. Peppers. Tomatoes. Mangoes. Managing constipation If you are taking iron pills, they may cause trouble pooping (constipation). To prevent or treat this, you may need to: Drink enough fluid to keep your pee (urine) pale yellow. Take over-the-counter or prescription medicines. Eat foods that are high in fiber. These include beans, whole grains, and fresh fruits and vegetables. Limit foods that are high in fat and sugar. These include fried or sweet foods. General instructions Return to your normal activities when your doctor says that it is safe. Keep all follow-up visits. Contact a doctor if: You feel like you may vomit (nauseous), or you vomit. You feel weak. You get light-headed when getting up from sitting or lying down. You are sweating for no reason. You have trouble pooping. You have worse breathing with physical activity. You have heaviness in your chest. Get help right away if: You faint. If this happens, do not drive yourself to the hospital. You have a fast heartbeat, or a heartbeat that does not feel regular. Summary Iron deficiency anemia happens  when you have too little iron in your body. This condition is treated by finding out why you do not have enough iron in your body and then getting more iron. Take over-the-counter and prescription medicines only as told by your doctor. Eat fresh fruits and vegetables that are high in vitamin C. Contact a doctor if you have trouble pooping or feel weak. This information is not intended to replace advice given to you by your health care provider. Make sure you discuss any questions you have with your health care provider. Document Revised: 04/15/2021 Document Reviewed: 04/15/2021 Elsevier Patient Education  [place].

## 2021-12-29 ENCOUNTER — Inpatient Hospital Stay: Payer: 59

## 2021-12-29 ENCOUNTER — Other Ambulatory Visit: Payer: Self-pay

## 2021-12-29 VITALS — BP 136/78 | HR 87 | Resp 16

## 2021-12-29 DIAGNOSIS — D500 Iron deficiency anemia secondary to blood loss (chronic): Secondary | ICD-10-CM

## 2021-12-29 DIAGNOSIS — D509 Iron deficiency anemia, unspecified: Secondary | ICD-10-CM | POA: Diagnosis not present

## 2021-12-29 MED ORDER — ACETAMINOPHEN 325 MG PO TABS
650.0000 mg | ORAL_TABLET | Freq: Once | ORAL | Status: AC
  Administered 2021-12-29: 650 mg via ORAL
  Filled 2021-12-29: qty 2

## 2021-12-29 MED ORDER — SODIUM CHLORIDE 0.9 % IV SOLN: Freq: Once | INTRAVENOUS | Status: AC

## 2021-12-29 MED ORDER — LORATADINE 10 MG PO TABS
10.0000 mg | ORAL_TABLET | Freq: Once | ORAL | Status: AC
  Administered 2021-12-29: 10 mg via ORAL
  Filled 2021-12-29: qty 1

## 2021-12-29 MED ORDER — SODIUM CHLORIDE 0.9 % IV SOLN
300.0000 mg | Freq: Once | INTRAVENOUS | Status: AC
  Administered 2021-12-29: 300 mg via INTRAVENOUS
  Filled 2021-12-29: qty 300

## 2021-12-29 NOTE — Patient Instructions (Signed)

## 2021-12-29 NOTE — Progress Notes (Signed)
Patient completed iron infusion without issue. Patient declined full 30 minute observation. Stable upon discharge with no signs of distress.

## 2021-12-30 ENCOUNTER — Ambulatory Visit: Payer: Self-pay | Admitting: Internal Medicine

## 2022-01-17 ENCOUNTER — Encounter: Payer: Self-pay | Admitting: Hematology and Oncology

## 2022-01-18 ENCOUNTER — Encounter: Payer: Self-pay | Admitting: Hematology and Oncology

## 2022-01-20 ENCOUNTER — Encounter: Payer: Self-pay | Admitting: Hematology and Oncology

## 2022-01-20 ENCOUNTER — Ambulatory Visit (INDEPENDENT_AMBULATORY_CARE_PROVIDER_SITE_OTHER): Payer: Commercial Managed Care - HMO | Admitting: Internal Medicine

## 2022-01-20 VITALS — BP 136/80 | HR 83 | Temp 97.7°F | Ht 63.0 in | Wt 145.0 lb

## 2022-01-20 DIAGNOSIS — I10 Essential (primary) hypertension: Secondary | ICD-10-CM | POA: Diagnosis not present

## 2022-01-20 DIAGNOSIS — E559 Vitamin D deficiency, unspecified: Secondary | ICD-10-CM | POA: Diagnosis not present

## 2022-01-20 DIAGNOSIS — D500 Iron deficiency anemia secondary to blood loss (chronic): Secondary | ICD-10-CM

## 2022-01-20 LAB — BASIC METABOLIC PANEL
BUN: 8 mg/dL (ref 6–23)
CO2: 29 mEq/L (ref 19–32)
Calcium: 10 mg/dL (ref 8.4–10.5)
Chloride: 102 mEq/L (ref 96–112)
Creatinine, Ser: 0.59 mg/dL (ref 0.40–1.20)
GFR: 104.89 mL/min (ref >60.00)
Glucose, Bld: 78 mg/dL (ref 70–99)
Potassium: 3.8 mEq/L (ref 3.5–5.1)
Sodium: 137 mEq/L (ref 135–145)

## 2022-01-20 LAB — CBC WITH DIFFERENTIAL/PLATELET
Basophils Absolute: 0 10*3/uL (ref 0.0–0.1)
Basophils Relative: 0.4 % (ref 0.0–3.0)
Eosinophils Absolute: 0 10*3/uL (ref 0.0–0.7)
Eosinophils Relative: 0.4 % (ref 0.0–5.0)
HCT: 38.1 % (ref 36.0–46.0)
Hemoglobin: 12 g/dL (ref 12.0–15.0)
Lymphocytes Relative: 39.3 % (ref 12.0–46.0)
Lymphs Abs: 1.2 10*3/uL (ref 0.7–4.0)
MCHC: 31.4 g/dL (ref 30.0–36.0)
MCV: 85 fl (ref 78.0–100.0)
Monocytes Absolute: 0.4 10*3/uL (ref 0.1–1.0)
Monocytes Relative: 13.1 % — ABNORMAL HIGH (ref 3.0–12.0)
Neutro Abs: 1.4 10*3/uL (ref 1.4–7.7)
Neutrophils Relative %: 46.8 % (ref 43.0–77.0)
Platelets: 211 10*3/uL (ref 150.0–400.0)
RBC: 4.49 Mil/uL (ref 3.87–5.11)
RDW: 30 % — ABNORMAL HIGH (ref 11.5–15.5)
WBC: 3 10*3/uL — ABNORMAL LOW (ref 4.0–10.5)

## 2022-01-20 LAB — HEPATIC FUNCTION PANEL
ALT: 21 U/L (ref 0–35)
AST: 23 U/L (ref 0–37)
Albumin: 4.6 g/dL (ref 3.5–5.2)
Alkaline Phosphatase: 43 U/L (ref 39–117)
Bilirubin, Direct: 0.1 mg/dL (ref 0.0–0.3)
Total Bilirubin: 0.3 mg/dL (ref 0.2–1.2)
Total Protein: 7.8 g/dL (ref 6.0–8.3)

## 2022-01-20 LAB — IBC PANEL
Iron: 94 ug/dL (ref 42–145)
Saturation Ratios: 27 % (ref 20.0–50.0)
TIBC: 348.6 ug/dL (ref 250.0–450.0)
Transferrin: 249 mg/dL (ref 212.0–360.0)

## 2022-01-20 LAB — FERRITIN: Ferritin: 68.3 ng/mL (ref 10.0–291.0)

## 2022-01-20 LAB — VITAMIN D 25 HYDROXY (VIT D DEFICIENCY, FRACTURES): VITD: 34.78 ng/mL (ref 30.00–100.00)

## 2022-01-20 MED ORDER — AMLODIPINE BESYLATE 5 MG PO TABS: 5.0000 mg | ORAL_TABLET | Freq: Every day | ORAL | 3 refills | Status: DC

## 2022-01-20 MED ORDER — IRON (FERROUS SULFATE) 75 (15 FE) MG/ML PO SOLN: ORAL | 1 refills | Status: DC

## 2022-01-20 MED ORDER — HYDROCHLOROTHIAZIDE 25 MG PO TABS: 25.0000 mg | ORAL_TABLET | Freq: Every day | ORAL | 3 refills | Status: DC

## 2022-01-20 NOTE — Patient Instructions (Signed)
Please take all new medication as prescribed - the iron solution  Please be sure to follow up with your GYN regarding the heavy menses  Please continue all other medications as before, and refills have been done if requested.  Please have the pharmacy call with any other refills you may need.  Please continue your efforts at being more active, low cholesterol diet, and weight control.  Please keep your appointments with your specialists as you may have planned  Please go to the LAB at the blood drawing area for the tests to be done  You will be contacted by phone if any changes need to be made immediately.  Otherwise, you will receive a letter about your results with an explanation, but please check with MyChart first.  Please remember to sign up for MyChart if you have not done so, as this will be important to you in the future with finding out test results, communicating by private email, and scheduling acute appointments online when needed.  Please make an Appointment to return in 3-4 months, or sooner if needed

## 2022-01-20 NOTE — Progress Notes (Signed)
Patient ID: Ruth Schmidt, female   DOB: Nov 07, 1970, 51 y.o.   MRN: [id number]        Chief Complaint: follow up iron deficiency anemia, htn, low vit d       HPI:  Ruth Schmidt is a 51 y.o. female here overall doing well, finished iron infusion and due for lab f/u.  Pt still having significant heavy menses, and states she will f/u with GYN soon.  Pt denies chest pain, increased sob or doe, wheezing, orthopnea, PND, increased LE swelling, palpitations, dizziness or syncope.   Pt denies polydipsia, polyuria, or new focal neuro s/s.    Pt denies fever, wt loss, night sweats, loss of appetite, or other constitutional symptoms         Wt Readings from Last 3 Encounters:  01/20/22 145 lb (65.8 kg)  12/09/21 147 lb 14.4 oz (67.1 kg)  11/05/21 148 lb (67.1 kg)   BP Readings from Last 3 Encounters:  01/20/22 136/80  12/29/21 136/78  12/22/21 126/83         Past Medical History:  Diagnosis Date   Abnormal Pap smear of cervix    Anemia    on iron daily for tx   Hypertension    on med   Lumbar herniated disc    Past Surgical History:  Procedure Laterality Date   CESAREAN SECTION     COLONOSCOPY  09/24/2021   TUBAL LIGATION Bilateral 03/22/2003   UMBILICAL HERNIA REPAIR N/A 10/19/2020   Procedure: UMBILICAL HERNIA REPAIR WITH MESH;  Surgeon: Griselda Miner, MD;  Location: [facility];  Service: General;  Laterality: N/A;    reports that she has never smoked. She has never used smokeless tobacco. She reports current alcohol use. She reports that she does not use drugs. family history includes Breast cancer (age of onset: 66) in her maternal aunt; Cancer in an other family member; Colon cancer in her maternal uncle; Coronary artery disease (age of onset: 58) in her maternal grandfather; Diabetes in her paternal grandmother; Hyperlipidemia in her father; Hypertension in her maternal grandmother and mother; Lung cancer in her maternal uncle; Prostate cancer in her father; Stroke  (age of onset: 105) in her maternal aunt. No Known Allergies Current Outpatient Medications on File Prior to Visit  Medication Sig Dispense Refill   Cholecalciferol (THERA-D 2000) 50 MCG (2000 UT) TABS 1 tab by mouth once daily 30 tablet 99   Multiple Vitamin (MULTI-VITAMIN) tablet Take 1 tablet by mouth daily.     No current facility-administered medications on file prior to visit.        ROS:  All others reviewed and negative.  Objective        PE:  BP 136/80 (BP Location: Right Arm, Patient Position: Sitting, Cuff Size: Large)   Pulse 83   Temp 97.7 F (36.5 C) (Oral)   Ht 5\' 3"  (1.6 m)   Wt 145 lb (65.8 kg)   SpO2 99%   BMI 25.69 kg/m                 Constitutional: Pt appears in NAD               HENT: Head: NCAT.                Right Ear: External ear normal.                 Left Ear: External ear normal.  Eyes: . Pupils are equal, round, and reactive to light. Conjunctivae and EOM are normal               Nose: without d/c or deformity               Neck: Neck supple. Gross normal ROM               Cardiovascular: Normal rate and regular rhythm.                 Pulmonary/Chest: Effort normal and breath sounds without rales or wheezing.                Abd:  Soft, NT, ND, + BS, no organomegaly               Neurological: Pt is alert. At baseline orientation, motor grossly intact               Skin: Skin is warm. No rashes, no other new lesions, LE edema - none               Psychiatric: Pt behavior is normal without agitation   Micro: none  Cardiac tracings I have personally interpreted today:  none  Pertinent Radiological findings (summarize): none   Lab Results  Component Value Date   WBC 3.0 (L) 01/20/2022   HGB 12.0 01/20/2022   HCT 38.1 01/20/2022   PLT 211.0 01/20/2022   GLUCOSE 78 01/20/2022   CHOL 181 06/30/2021   TRIG 114.0 06/30/2021   HDL 59.30 06/30/2021   LDLDIRECT 121.7 01/04/2013   LDLCALC 99 06/30/2021   ALT 21 01/20/2022   AST  23 01/20/2022   NA 137 01/20/2022   K 3.8 01/20/2022   CL 102 01/20/2022   CREATININE 0.59 01/20/2022   BUN 8 01/20/2022   CO2 29 01/20/2022   TSH 3.23 06/30/2021   HGBA1C 5.3 10/27/2017   Assessment/Plan:  Ruth Schmidt is a 50 y.o. Black or African American [2] female with  has a past medical history of Abnormal Pap smear of cervix, Anemia, Hypertension, and Lumbar herniated disc.  Vitamin D deficiency Last vitamin D Lab Results  Component Value Date   VD25OH 34.78 01/20/2022   Remains low, to start oral replacement   Hypertension, uncontrolled BP Readings from Last 3 Encounters:  01/20/22 136/80  12/29/21 136/78  12/22/21 126/83   Stable, pt to continue medical treatment norvasc 5 mg qd, hct 25 mg qd   Anemia, iron deficiency Still with significant heavy menses, tolerating ok, to f/u with GYN, but also check iron level, cbc  Followup: Return in about 3 months (around 04/22/2022).  Cathlean Cower, MD 01/21/2022 8:41 PM [facility]

## 2022-01-21 ENCOUNTER — Encounter: Payer: Self-pay | Admitting: Internal Medicine

## 2022-01-21 NOTE — Assessment & Plan Note (Signed)
BP Readings from Last 3 Encounters:  01/20/22 136/80  12/29/21 136/78  12/22/21 126/83   Stable, pt to continue medical treatment norvasc 5 mg qd, hct 25 mg qd

## 2022-01-21 NOTE — Assessment & Plan Note (Signed)
Last vitamin D Lab Results  Component Value Date   VD25OH 34.78 01/20/2022   Remains low, to start oral replacement

## 2022-01-21 NOTE — Assessment & Plan Note (Signed)
Still with significant heavy menses, tolerating ok, to f/u with GYN, but also check iron level, cbc

## 2022-02-14 ENCOUNTER — Telehealth: Payer: Self-pay | Admitting: Hematology and Oncology

## 2022-02-17 ENCOUNTER — Encounter: Payer: Self-pay | Admitting: Hematology and Oncology

## 2022-03-04 ENCOUNTER — Encounter: Payer: Self-pay | Admitting: Hematology and Oncology

## 2022-03-11 ENCOUNTER — Inpatient Hospital Stay: Payer: Commercial Managed Care - HMO | Attending: Hematology and Oncology

## 2022-03-15 ENCOUNTER — Inpatient Hospital Stay: Payer: Commercial Managed Care - HMO | Admitting: Hematology and Oncology

## 2022-03-16 ENCOUNTER — Telehealth: Payer: Self-pay | Admitting: Hematology and Oncology

## 2022-03-16 NOTE — Telephone Encounter (Signed)
Rescheduled appointment per provider on call. Left voicemail.  

## 2022-03-24 ENCOUNTER — Inpatient Hospital Stay: Payer: Commercial Managed Care - HMO | Admitting: Hematology and Oncology

## 2022-04-05 NOTE — Assessment & Plan Note (Addendum)
Lab review 12/02/2021: WBC 3.3, hemoglobin 7.3, MCV 69.8, RDW 18.7, ferritin 2.9, TIBC 495, iron saturation 3.4% 01/20/2022: WBC 3.0, hemoglobin 12, MCV 85, iron saturation 27%, ferritin 68  The most likely cause of her anemia is heavy menstrual bleeding   IV iron: September 2023  Return to clinic every 3 months for labs and follow-up with a telephone visit afterwards

## 2022-04-06 ENCOUNTER — Inpatient Hospital Stay: Payer: Commercial Managed Care - HMO | Attending: Hematology and Oncology | Admitting: Hematology and Oncology

## 2022-04-06 DIAGNOSIS — D500 Iron deficiency anemia secondary to blood loss (chronic): Secondary | ICD-10-CM

## 2022-04-06 MED ORDER — IRON (FERROUS SULFATE) 75 (15 FE) MG/ML PO SOLN: ORAL | 1 refills | Status: DC

## 2022-04-06 NOTE — Progress Notes (Signed)
HEMATOLOGY-ONCOLOGY TELEPHONE VISIT PROGRESS NOTE  I connected with our patient on 04/06/22 at  3:00 PM EST by telephone and verified that I am speaking with the correct person using two identifiers.  I discussed the limitations, risks, security and privacy concerns of performing an evaluation and management service by telephone and the availability of in person appointments.  I also discussed with the patient that there may be a patient responsible charge related to this service. The patient expressed understanding and agreed to proceed.   History of Present Illness: Follow-up to discuss results of iron infusions after being diagnosed with severe iron deficiency anemia.  Her energy levels have improved significantly.  She had labs done in November which showed remarkable improvement in hemoglobin from 7 g to 12 g.    REVIEW OF SYSTEMS:   Constitutional: Denies fevers, chills or abnormal weight loss All other systems were reviewed with the patient and are negative.  Observations/Objective:    Assessment Plan:  Anemia, iron deficiency Lab review 12/02/2021: WBC 3.3, hemoglobin 7.3, MCV 69.8, RDW 18.7, ferritin 2.9, TIBC 495, iron saturation 3.4% 01/20/2022: WBC 3.0, hemoglobin 12, MCV 85, iron saturation 27%, ferritin 68  IV Iron: Sept 2023  The most likely cause of her anemia is heavy menstrual bleeding  Patient will take oral liquid iron supplementation and see if she can maintain her iron levels.  Return to clinic every 3 months for labs and follow-up with a telephone visit afterwards   I discussed the assessment and treatment plan with the patient. The patient was provided an opportunity to ask questions and all were answered. The patient agreed with the plan and demonstrated an understanding of the instructions. The patient was advised to call back or seek an in-person evaluation if the symptoms worsen or if the condition fails to improve as anticipated.   I provided 12 minutes of  non-face-to-face time during this encounter.  This includes time for charting and coordination of care   Harriette Ohara, MD  I Gardiner Coins am acting as a scribe for Dr.Margree Gimbel  I have reviewed the above documentation for accuracy and completeness, and I agree with the above.

## 2022-04-11 ENCOUNTER — Telehealth: Payer: Self-pay | Admitting: Hematology and Oncology

## 2022-04-11 NOTE — Telephone Encounter (Signed)
Scheduled appointment per 11/7 los. Left voicemail. 

## 2022-04-14 ENCOUNTER — Encounter: Payer: Self-pay | Admitting: Hematology and Oncology

## 2022-04-22 ENCOUNTER — Ambulatory Visit (INDEPENDENT_AMBULATORY_CARE_PROVIDER_SITE_OTHER): Payer: PRIVATE HEALTH INSURANCE | Admitting: Internal Medicine

## 2022-04-22 ENCOUNTER — Encounter: Payer: Self-pay | Admitting: Hematology and Oncology

## 2022-04-22 VITALS — BP 128/74 | HR 84 | Temp 98.4°F | Ht 63.0 in | Wt 153.0 lb

## 2022-04-22 DIAGNOSIS — D500 Iron deficiency anemia secondary to blood loss (chronic): Secondary | ICD-10-CM | POA: Diagnosis not present

## 2022-04-22 DIAGNOSIS — Z0001 Encounter for general adult medical examination with abnormal findings: Secondary | ICD-10-CM

## 2022-04-22 DIAGNOSIS — I10 Essential (primary) hypertension: Secondary | ICD-10-CM | POA: Diagnosis not present

## 2022-04-22 DIAGNOSIS — Z0000 Encounter for general adult medical examination without abnormal findings: Secondary | ICD-10-CM | POA: Diagnosis not present

## 2022-04-22 DIAGNOSIS — E538 Deficiency of other specified B group vitamins: Secondary | ICD-10-CM

## 2022-04-22 DIAGNOSIS — N920 Excessive and frequent menstruation with regular cycle: Secondary | ICD-10-CM | POA: Diagnosis not present

## 2022-04-22 DIAGNOSIS — E559 Vitamin D deficiency, unspecified: Secondary | ICD-10-CM | POA: Diagnosis not present

## 2022-04-22 DIAGNOSIS — R739 Hyperglycemia, unspecified: Secondary | ICD-10-CM | POA: Diagnosis not present

## 2022-04-22 LAB — URINALYSIS, ROUTINE W REFLEX MICROSCOPIC
Bilirubin Urine: NEGATIVE
Hgb urine dipstick: NEGATIVE
Ketones, ur: NEGATIVE
Leukocytes,Ua: NEGATIVE
Nitrite: NEGATIVE
Specific Gravity, Urine: 1.02 (ref 1.000–1.030)
Total Protein, Urine: NEGATIVE
Urine Glucose: NEGATIVE
Urobilinogen, UA: 0.2 (ref 0.0–1.0)
pH: 6.5 (ref 5.0–8.0)

## 2022-04-22 LAB — CBC WITH DIFFERENTIAL/PLATELET
Basophils Absolute: 0 10*3/uL (ref 0.0–0.1)
Basophils Relative: 0.2 % (ref 0.0–3.0)
Eosinophils Absolute: 0 10*3/uL (ref 0.0–0.7)
Eosinophils Relative: 0.4 % (ref 0.0–5.0)
HCT: 33.9 % — ABNORMAL LOW (ref 36.0–46.0)
Hemoglobin: 11 g/dL — ABNORMAL LOW (ref 12.0–15.0)
Lymphocytes Relative: 33.1 % (ref 12.0–46.0)
Lymphs Abs: 1.3 10*3/uL (ref 0.7–4.0)
MCHC: 32.5 g/dL (ref 30.0–36.0)
MCV: 91.5 fl (ref 78.0–100.0)
Monocytes Absolute: 0.4 10*3/uL (ref 0.1–1.0)
Monocytes Relative: 10.1 % (ref 3.0–12.0)
Neutro Abs: 2.2 10*3/uL (ref 1.4–7.7)
Neutrophils Relative %: 56.2 % (ref 43.0–77.0)
Platelets: 283 10*3/uL (ref 150.0–400.0)
RBC: 3.7 Mil/uL — ABNORMAL LOW (ref 3.87–5.11)
RDW: 14.6 % (ref 11.5–15.5)
WBC: 4 10*3/uL (ref 4.0–10.5)

## 2022-04-22 LAB — LIPID PANEL
Cholesterol: 191 mg/dL (ref 0–200)
HDL: 66.7 mg/dL (ref >39.00)
LDL Cholesterol: 106 mg/dL — ABNORMAL HIGH (ref 0–99)
NonHDL: 124.45
Total CHOL/HDL Ratio: 3
Triglycerides: 92 mg/dL (ref 0.0–149.0)
VLDL: 18.4 mg/dL (ref 0.0–40.0)

## 2022-04-22 LAB — BASIC METABOLIC PANEL
BUN: 9 mg/dL (ref 6–23)
CO2: 27 mEq/L (ref 19–32)
Calcium: 9.5 mg/dL (ref 8.4–10.5)
Chloride: 102 mEq/L (ref 96–112)
Creatinine, Ser: 0.57 mg/dL (ref 0.40–1.20)
GFR: 105.58 mL/min (ref >60.00)
Glucose, Bld: 63 mg/dL — ABNORMAL LOW (ref 70–99)
Potassium: 3.6 mEq/L (ref 3.5–5.1)
Sodium: 136 mEq/L (ref 135–145)

## 2022-04-22 LAB — HEPATIC FUNCTION PANEL
ALT: 18 U/L (ref 0–35)
AST: 20 U/L (ref 0–37)
Albumin: 4.4 g/dL (ref 3.5–5.2)
Alkaline Phosphatase: 41 U/L (ref 39–117)
Bilirubin, Direct: 0.1 mg/dL (ref 0.0–0.3)
Total Bilirubin: 0.4 mg/dL (ref 0.2–1.2)
Total Protein: 7.7 g/dL (ref 6.0–8.3)

## 2022-04-22 LAB — TSH: TSH: 2.82 u[IU]/mL (ref 0.35–5.50)

## 2022-04-22 LAB — VITAMIN B12: Vitamin B-12: 712 pg/mL (ref 211–911)

## 2022-04-22 LAB — HEMOGLOBIN A1C: Hgb A1c MFr Bld: 5.1 % (ref 4.6–6.5)

## 2022-04-22 LAB — VITAMIN D 25 HYDROXY (VIT D DEFICIENCY, FRACTURES): VITD: 37.37 ng/mL (ref 30.00–100.00)

## 2022-04-22 MED ORDER — HYDROCHLOROTHIAZIDE 25 MG PO TABS: 25.0000 mg | ORAL_TABLET | Freq: Every day | ORAL | 3 refills | Status: AC

## 2022-04-22 MED ORDER — AMLODIPINE BESYLATE 5 MG PO TABS: 5.0000 mg | ORAL_TABLET | Freq: Every day | ORAL | 3 refills | Status: DC

## 2022-04-22 NOTE — Patient Instructions (Addendum)
Please remember to see your GYN for yearly appt  Please continue all other medications as before, and refills have been done if requested.  Please have the pharmacy call with any other refills you may need.  Please continue your efforts at being more active, low cholesterol diet, and weight control.  You are otherwise up to date with prevention measures today.  Please keep your appointments with your specialists as you may have planned  Please go to the LAB at the blood drawing area for the tests to be done  You will be contacted by phone if any changes need to be made immediately.  Otherwise, you will receive a letter about your results with an explanation, but please check with MyChart first.  Please remember to sign up for MyChart if you have not done so, as this will be important to you in the future with finding out test results, communicating by private email, and scheduling acute appointments online when needed.  Please make an Appointment to return for your 1 year visit, or sooner if needed

## 2022-04-22 NOTE — Progress Notes (Unsigned)
Patient ID: Ruth Schmidt, female   DOB: 1970/06/03, 52 y.o.   MRN: [id number]         Chief Complaint:: wellness exam and iron deficiency anemia, htn, low Vit D       HPI:  Ruth Schmidt is a 52 y.o. female here for wellness exam; for shingrix at pharmacy, decines covid booster, flu shot, and for pap and mammogram with pt to call GYN, o/w up to date                        Also Pt denies chest pain, increased sob or doe, wheezing, orthopnea, PND, increased LE swelling, palpitations, dizziness or syncope.   Pt denies polydipsia, polyuria, or new focal neuro s/s.    Pt denies fever, wt loss, night sweats, loss of appetite, or other constitutional symptoms  No recent overt bleeding or bruising   Wt Readings from Last 3 Encounters:  04/22/22 153 lb (69.4 kg)  01/20/22 145 lb (65.8 kg)  12/09/21 147 lb 14.4 oz (67.1 kg)   BP Readings from Last 3 Encounters:  04/22/22 128/74  01/20/22 136/80  12/29/21 136/78   Immunization History  Administered Date(s) Administered   PFIZER(Purple Top)SARS-COV-2 Vaccination 05/24/2019, 06/14/2019   Tdap 03/21/2006, 06/28/2017  There are no preventive care reminders to display for this patient.    Past Medical History:  Diagnosis Date   Abnormal Pap smear of cervix    Anemia    on iron daily for tx   Hypertension    on med   Lumbar herniated disc    Past Surgical History:  Procedure Laterality Date   CESAREAN SECTION     COLONOSCOPY  09/24/2021   TUBAL LIGATION Bilateral 73/41/9379   UMBILICAL HERNIA REPAIR N/A 10/19/2020   Procedure: UMBILICAL HERNIA REPAIR WITH MESH;  Surgeon: Jovita Kussmaul, MD;  Location: [facility];  Service: General;  Laterality: N/A;    reports that she has never smoked. She has never used smokeless tobacco. She reports current alcohol use. She reports that she does not use drugs. family history includes Breast cancer (age of onset: 20) in her maternal aunt; Cancer in an other family member; Colon cancer  in her maternal uncle; Coronary artery disease (age of onset: 65) in her maternal grandfather; Diabetes in her paternal grandmother; Hyperlipidemia in her father; Hypertension in her maternal grandmother and mother; Lung cancer in her maternal uncle; Prostate cancer in her father; Stroke (age of onset: 61) in her maternal aunt. No Known Allergies Current Outpatient Medications on File Prior to Visit  Medication Sig Dispense Refill   Cholecalciferol (THERA-D 2000) 50 MCG (2000 UT) TABS 1 tab by mouth once daily 30 tablet 99   Iron, Ferrous Sulfate, 75 (15 Fe) MG/ML SOLN Take 5 ml oral once daily 450 mL 1   Multiple Vitamin (MULTI-VITAMIN) tablet Take 1 tablet by mouth daily.     No current facility-administered medications on file prior to visit.        ROS:  All others reviewed and negative.  Objective        PE:  BP 128/74 (BP Location: Right Arm, Patient Position: Sitting, Cuff Size: Large)   Pulse 84   Temp 98.4 F (36.9 C) (Oral)   Ht 5\' 3"  (1.6 m)   Wt 153 lb (69.4 kg)   SpO2 99%   BMI 27.10 kg/m  Constitutional: Pt appears in NAD               HENT: Head: NCAT.                Right Ear: External ear normal.                 Left Ear: External ear normal.                Eyes: . Pupils are equal, round, and reactive to light. Conjunctivae and EOM are normal               Nose: without d/c or deformity               Neck: Neck supple. Gross normal ROM               Cardiovascular: Normal rate and regular rhythm.                 Pulmonary/Chest: Effort normal and breath sounds without rales or wheezing.                Abd:  Soft, NT, ND, + BS, no organomegaly               Neurological: Pt is alert. At baseline orientation, motor grossly intact               Skin: Skin is warm. No rashes, no other new lesions, LE edema - none               Psychiatric: Pt behavior is normal without agitation   Micro: none  Cardiac tracings I have personally interpreted today:   none  Pertinent Radiological findings (summarize): none   Lab Results  Component Value Date   WBC 4.0 04/22/2022   HGB 11.0 (L) 04/22/2022   HCT 33.9 (L) 04/22/2022   PLT 283.0 04/22/2022   GLUCOSE 63 (L) 04/22/2022   CHOL 191 04/22/2022   TRIG 92.0 04/22/2022   HDL 66.70 04/22/2022   LDLDIRECT 121.7 01/04/2013   LDLCALC 106 (H) 04/22/2022   ALT 18 04/22/2022   AST 20 04/22/2022   NA 136 04/22/2022   K 3.6 04/22/2022   CL 102 04/22/2022   CREATININE 0.57 04/22/2022   BUN 9 04/22/2022   CO2 27 04/22/2022   TSH 2.82 04/22/2022   HGBA1C 5.1 04/22/2022   Assessment/Plan:  Ruth Schmidt is a 52 y.o. Black or African American [2] female with  has a past medical history of Abnormal Pap smear of cervix, Anemia, Hypertension, and Lumbar herniated disc.  Anemia, iron deficiency Lab Results  Component Value Date   WBC 4.0 04/22/2022   HGB 11.0 (L) 04/22/2022   HCT 33.9 (L) 04/22/2022   MCV 91.5 04/22/2022   PLT 283.0 04/22/2022   Mild worsening, cont oral iron  HTN (hypertension) BP Readings from Last 3 Encounters:  04/22/22 128/74  01/20/22 136/80  12/29/21 136/78   Stable, pt to continue medical treatment norvasc 5 qd , hct 25 qd   Menorrhagia Pt to continue regular f/u with GYN  Vitamin D deficiency Last vitamin D Lab Results  Component Value Date   VD25OH 37.37 04/22/2022   Low, to start oral replacement  Followup: Return in about 1 year (around 04/23/2023).  Cathlean Cower, MD 04/23/2022 6:48 PM [facility]

## 2022-04-23 ENCOUNTER — Encounter: Payer: Self-pay | Admitting: Internal Medicine

## 2022-04-23 NOTE — Assessment & Plan Note (Signed)
BP Readings from Last 3 Encounters:  04/22/22 128/74  01/20/22 136/80  12/29/21 136/78   Stable, pt to continue medical treatment norvasc 5 qd , hct 25 qd

## 2022-04-23 NOTE — Assessment & Plan Note (Signed)
Lab Results  Component Value Date   WBC 4.0 04/22/2022   HGB 11.0 (L) 04/22/2022   HCT 33.9 (L) 04/22/2022   MCV 91.5 04/22/2022   PLT 283.0 04/22/2022   Mild worsening, cont oral iron

## 2022-04-23 NOTE — Assessment & Plan Note (Signed)
Last vitamin D Lab Results  Component Value Date   VD25OH 37.37 04/22/2022   Low, to start oral replacement

## 2022-04-23 NOTE — Assessment & Plan Note (Signed)
Pt to continue regular f/u with GYN

## 2022-04-29 IMAGING — MR MR LUMBAR SPINE W/O CM
4 of 5 series · 19 of 48 positions shown · non-contrast
Comparison: None.

CLINICAL DATA: Lumbar radiculopathy.

EXAM:
MRI LUMBAR SPINE WITHOUT CONTRAST
TECHNIQUE: Multiplanar, multisequence MR imaging of the lumbar spine was
performed. No intravenous contrast was administered.

[Series 5: T2 · sagittal · 4.0mm · 0.73mm/px · 6 of 15 slices shown (1 of 2)]
[im 1/15]
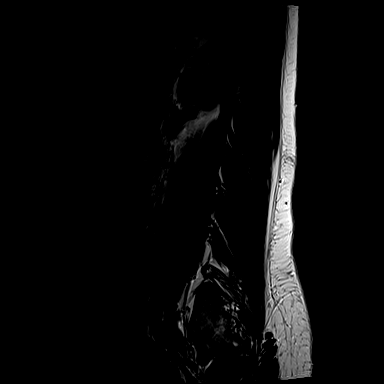
[im 3/15]
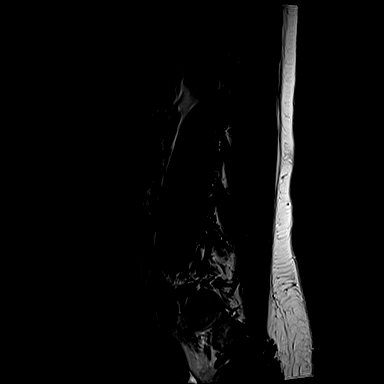
[im 6/15]
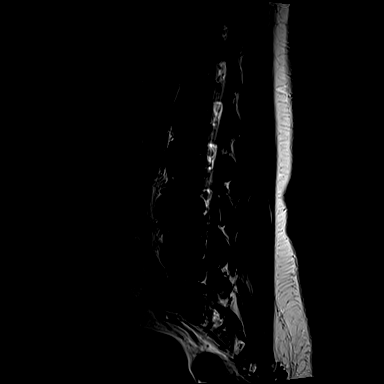
[im 9/15]
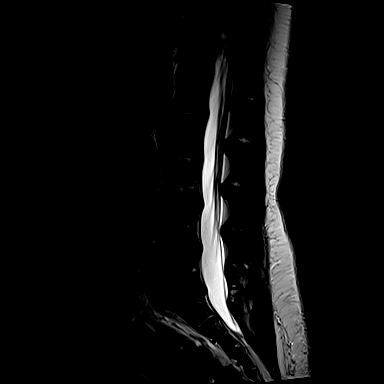
[im 12/15]
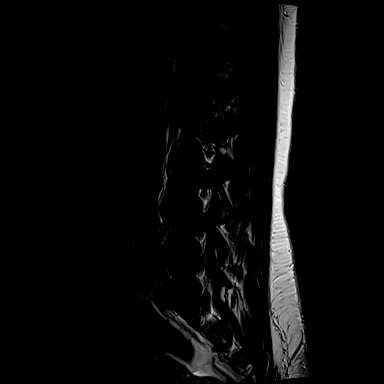
[im 15/15]
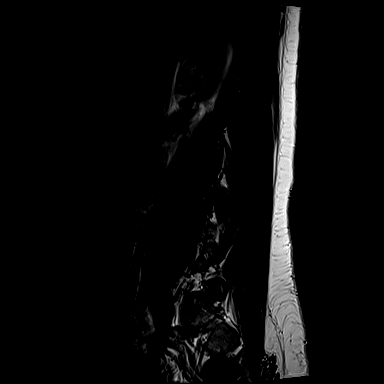

[Series 6: T1 · sagittal · 4.0mm · 0.73mm/px · 3 of 15 slices shown (1 of 2)]
[im 3/15]
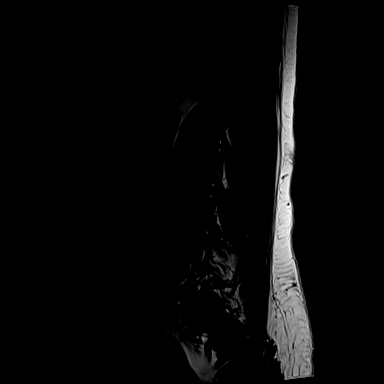
[im 9/15]
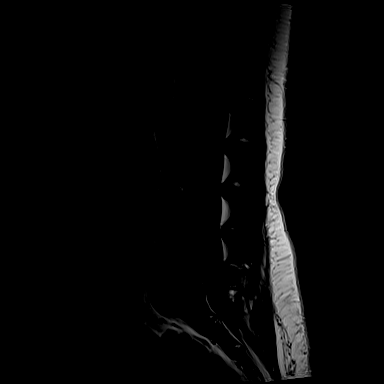
[im 15/15]
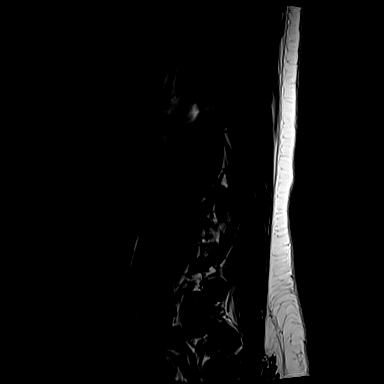

[Series 10: T1 · axial · 4.0mm · 0.28mm/px · z∈[-75,+76]mm · 3 of 39 slices shown (2 of 2)]
[im 6/39]
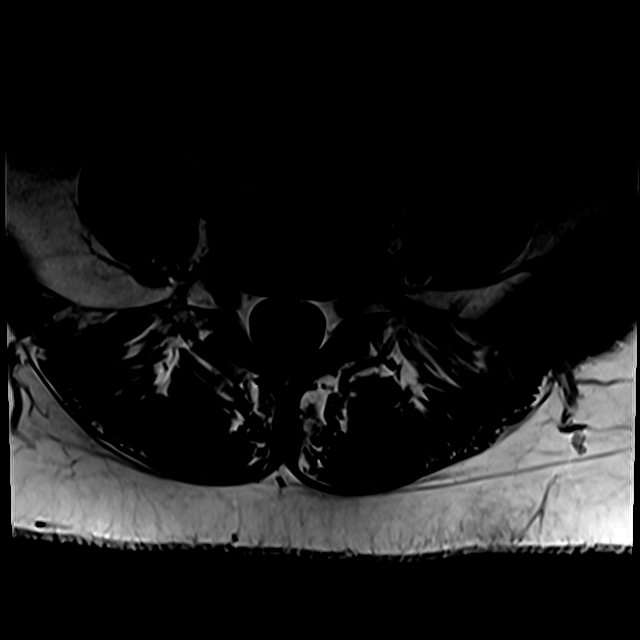
[im 20/39]
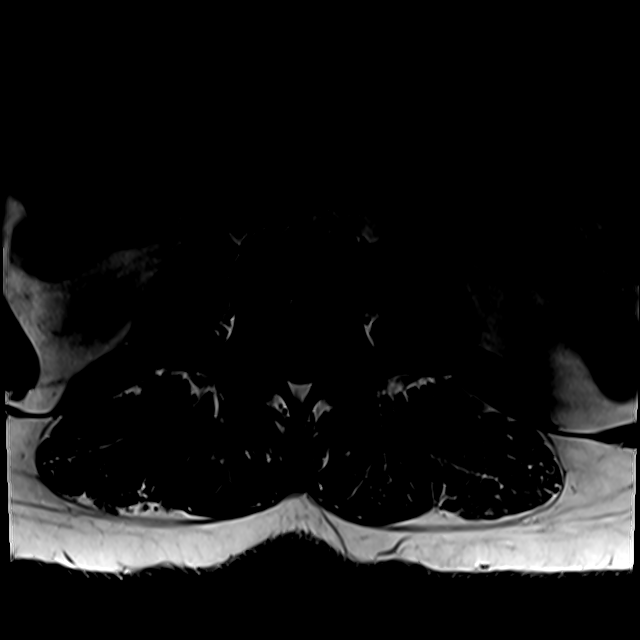
[im 33/39]
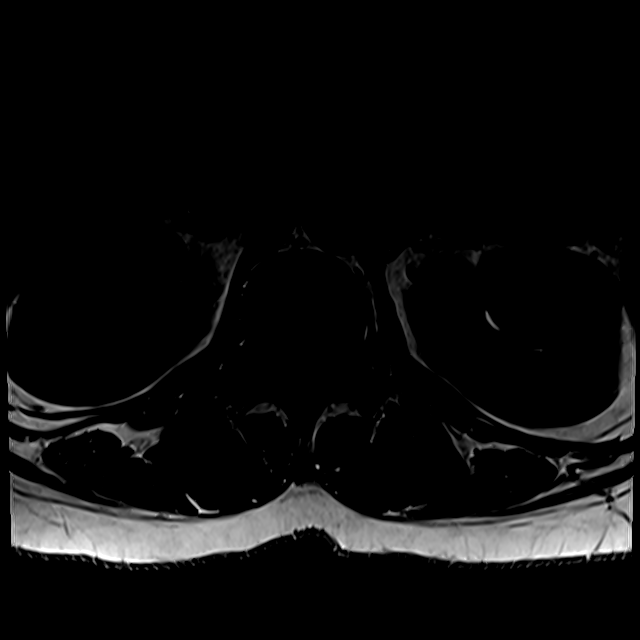

[Series 13: T2 · axial · 4.0mm · 0.28mm/px · z∈[-100,+76]mm · 7 of 39 slices shown (2 of 2)]
[im 1/39]
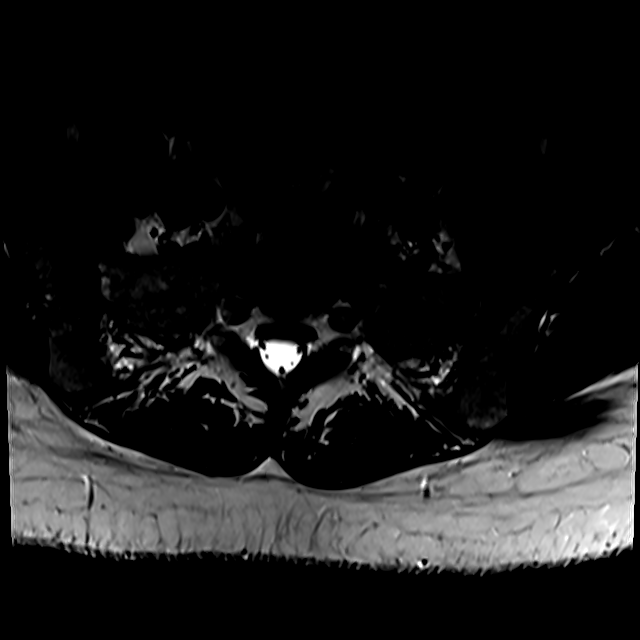
[im 6/39]
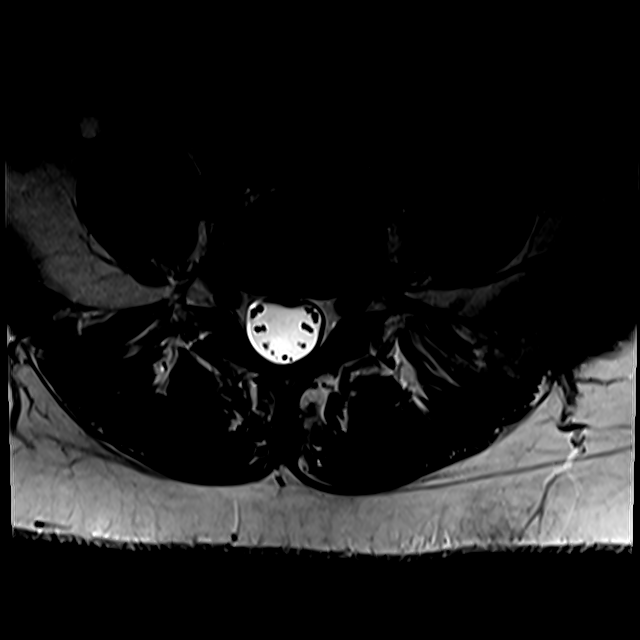
[im 11/39]
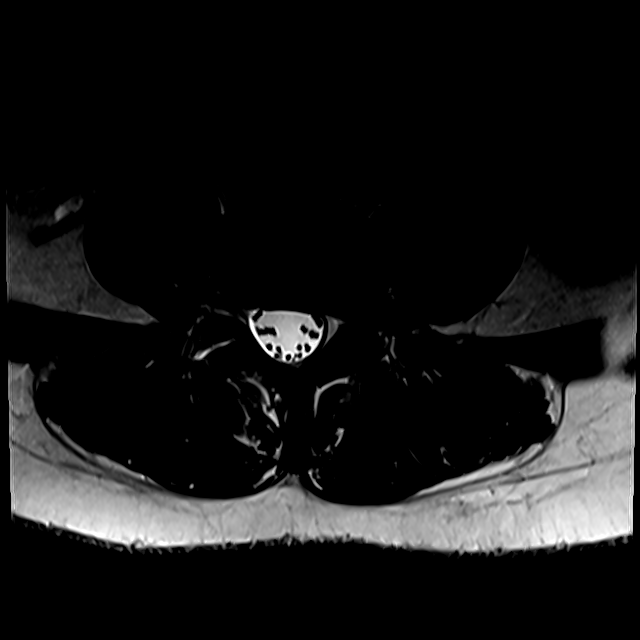
[im 17/39]
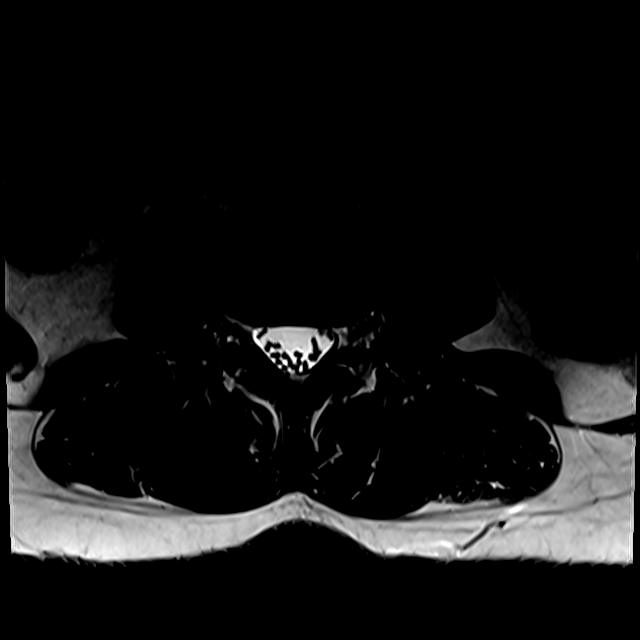
[im 20/39]
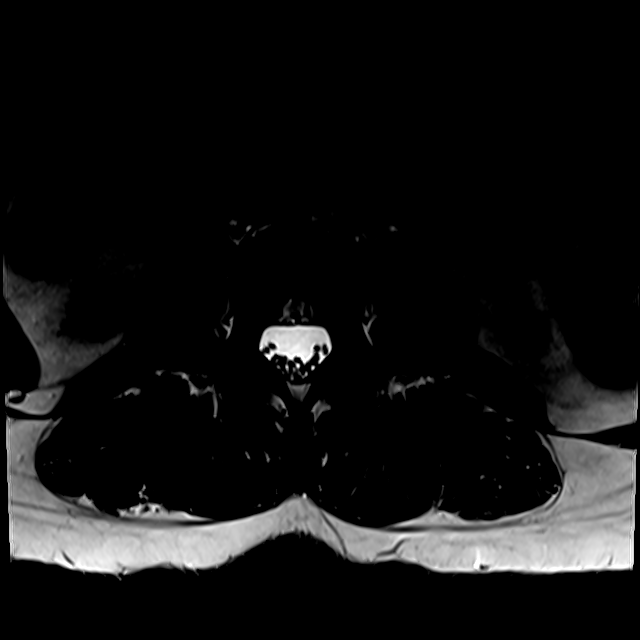
[im 22/39]
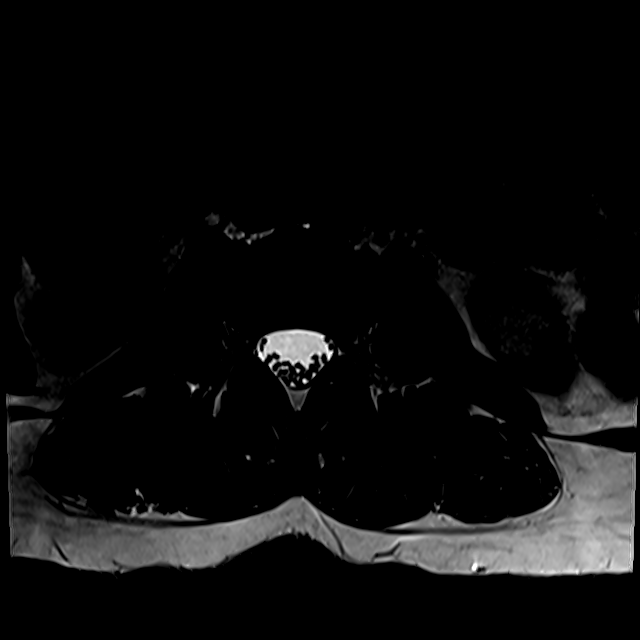
[im 33/39]
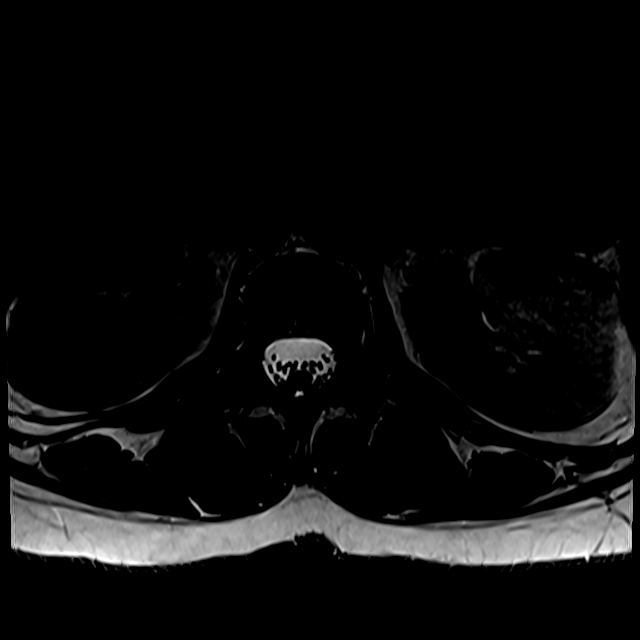

[19 of 48 positions shown; findings below may reference images not displayed]

FINDINGS: Segmentation: Assumed standard. The last well-formed disc space is
designated L5-S1 for the purposes of this report.

Alignment: Straightening of the normal lumbar lordosis. Trace
retrolisthesis at L5-S1.

Vertebrae:  No fracture, evidence of discitis, or bone lesion.

Conus medullaris and cauda equina: Conus extends to the L2 level.
Conus and cauda equina appear normal.

Paraspinal and other soft tissues: Negative.

Disc levels:

T11-T12 to L2-L3: Negative.

L3-L4:  Mild disc bulging.  No stenosis.

L4-L5: Mild disc bulging with superimposed left foraminal disc
extrusion. Severe left neuroforaminal stenosis. No spinal canal or
right neuroforaminal stenosis.

L5-S1: Mild disc bulging and endplate spurring eccentric to the
left. Mild left neuroforaminal stenosis. No spinal canal or right
neuroforaminal stenosis.
IMPRESSION: 1. Left foraminal disc extrusion at L4-L5 resulting in severe left
neuroforaminal stenosis and impingement of the exiting left L4 nerve
root.

## 2022-06-28 ENCOUNTER — Encounter: Payer: Self-pay | Admitting: Hematology and Oncology

## 2022-07-05 ENCOUNTER — Inpatient Hospital Stay: Payer: PRIVATE HEALTH INSURANCE | Attending: Hematology and Oncology

## 2022-07-08 ENCOUNTER — Telehealth: Payer: Self-pay | Admitting: Hematology and Oncology

## 2022-07-08 ENCOUNTER — Inpatient Hospital Stay: Payer: PRIVATE HEALTH INSURANCE | Admitting: Hematology and Oncology

## 2022-07-08 NOTE — Telephone Encounter (Signed)
Cancelled and rescheduled appointments per scheduling message. Patient is aware of the newly made appointments.

## 2022-07-08 NOTE — Assessment & Plan Note (Signed)
Lab review 12/02/2021: WBC 3.3, hemoglobin 7.3, MCV 69.8, RDW 18.7, ferritin 2.9, TIBC 495, iron saturation 3.4% 01/20/2022: WBC 3.0, hemoglobin 12, MCV 85, iron saturation 27%, ferritin 68   IV Iron: Sept 2023   The most likely cause of her anemia is heavy menstrual bleeding  Patient will take oral liquid iron supplementation and see if she can maintain her iron levels.

## 2022-08-01 ENCOUNTER — Inpatient Hospital Stay: Payer: PRIVATE HEALTH INSURANCE | Attending: Hematology and Oncology

## 2022-08-02 NOTE — Progress Notes (Signed)
HEMATOLOGY-ONCOLOGY TELEPHONE VISIT PROGRESS NOTE  I connected with our patient on 08/03/22 at  1:45 PM EDT by telephone and verified that I am speaking with the correct person using two identifiers.  I discussed the limitations, risks, security and privacy concerns of performing an evaluation and management service by telephone and the availability of in person appointments.  I also discussed with the patient that there may be a patient responsible charge related to this service. The patient expressed understanding and agreed to proceed.   History of Present Illness: Ruth Schmidt 52 y.o. female is here because of Iron deficiency anemia. She presents to the clinic for a telephone she has not had any lab work done recently.  In February her hemoglobin was 11 with an MCV of 91.5.  She received IV iron in September October 2023.  REVIEW OF SYSTEMS:   Constitutional: Denies fevers, chills or abnormal weight loss All other systems were reviewed with the patient and are negative. Observations/Objective:     Assessment Plan:  Anemia, iron deficiency Lab review 12/02/2021: WBC 3.3, hemoglobin 7.3, MCV 69.8, RDW 18.7, ferritin 2.9, TIBC 495, iron saturation 3.4% 01/20/2022: WBC 3.0, hemoglobin 12, MCV 85, iron saturation 27%, ferritin 68   IV Iron: Sept 2023   The most likely cause of her anemia is heavy menstrual bleeding  Patient will take oral liquid iron supplementation and see if she can maintain her iron levels.   We will recheck her iron levels next Tuesday and I will call her couple of days later to go over the results.    I discussed the assessment and treatment plan with the patient. The patient was provided an opportunity to ask questions and all were answered. The patient agreed with the plan and demonstrated an understanding of the instructions. The patient was advised to call back or seek an in-person evaluation if the symptoms worsen or if the condition fails to improve as  anticipated.   I provided 12 minutes of non-face-to-face time during this encounter.  This includes time for charting and coordination of care   Tamsen Meek, MD  I Janan Ridge am acting as a scribe for Dr.Vinay Gudena  I have reviewed the above documentation for accuracy and completeness, and I agree with the above.

## 2022-08-02 NOTE — Assessment & Plan Note (Signed)
Lab review 12/02/2021: WBC 3.3, hemoglobin 7.3, MCV 69.8, RDW 18.7, ferritin 2.9, TIBC 495, iron saturation 3.4% 01/20/2022: WBC 3.0, hemoglobin 12, MCV 85, iron saturation 27%, ferritin 68   IV Iron: Sept 2023   The most likely cause of her anemia is heavy menstrual bleeding  Patient will take oral liquid iron supplementation and see if she can maintain her iron levels.   Return to clinic every 3 months for labs and follow-up with a telephone visit afterwards

## 2022-08-03 ENCOUNTER — Inpatient Hospital Stay (HOSPITAL_BASED_OUTPATIENT_CLINIC_OR_DEPARTMENT_OTHER): Payer: PRIVATE HEALTH INSURANCE | Admitting: Hematology and Oncology

## 2022-08-03 DIAGNOSIS — D500 Iron deficiency anemia secondary to blood loss (chronic): Secondary | ICD-10-CM | POA: Diagnosis not present

## 2022-08-04 ENCOUNTER — Telehealth: Payer: Self-pay | Admitting: Hematology and Oncology

## 2022-08-04 NOTE — Telephone Encounter (Signed)
Scheduled appointment per 5/16 los. Patient is aware of the made appointments.

## 2022-08-09 ENCOUNTER — Inpatient Hospital Stay: Payer: PRIVATE HEALTH INSURANCE

## 2022-08-10 ENCOUNTER — Telehealth: Payer: Self-pay | Admitting: Hematology and Oncology

## 2022-08-10 NOTE — Telephone Encounter (Signed)
Rescheduled and scheduled appointment per scheduling message. Left voicemail.

## 2022-08-11 ENCOUNTER — Inpatient Hospital Stay: Payer: PRIVATE HEALTH INSURANCE | Admitting: Hematology and Oncology

## 2022-08-12 ENCOUNTER — Inpatient Hospital Stay: Payer: PRIVATE HEALTH INSURANCE

## 2022-08-16 ENCOUNTER — Inpatient Hospital Stay (HOSPITAL_BASED_OUTPATIENT_CLINIC_OR_DEPARTMENT_OTHER): Payer: PRIVATE HEALTH INSURANCE | Admitting: Adult Health

## 2022-08-16 DIAGNOSIS — D500 Iron deficiency anemia secondary to blood loss (chronic): Secondary | ICD-10-CM

## 2022-08-16 NOTE — Progress Notes (Signed)
Ruth Schmidt had an appointment today to review her lab testing.  She did not undergo lab testing last week as scheduled.  I did talk to her today about this and sent another scheduling request to have these labs tested.  I reentered the lab orders under my name therefore when they come to my in basket I will send them to my nurse so then we can schedule a follow-up discussion about her labs once we have the lab results and hand.  Ruth Schmidt verbalized understanding of this and knows that our schedulers will contact her to reschedule a lab only appointment.  Lillard Anes, NP 08/16/22 4:19 PM Medical Oncology and Hematology [facility] [address] [place], [place] [address] Tel. [phone]    Fax. [phone]  Time on phone was 3 minutes.

## 2022-08-17 ENCOUNTER — Telehealth: Payer: Self-pay | Admitting: Adult Health

## 2022-08-17 NOTE — Telephone Encounter (Signed)
Scheduled appointment per los. Patient is aware of the made appointment. 

## 2022-09-19 ENCOUNTER — Other Ambulatory Visit: Payer: Self-pay | Admitting: Internal Medicine

## 2022-09-19 DIAGNOSIS — Z1231 Encounter for screening mammogram for malignant neoplasm of breast: Secondary | ICD-10-CM

## 2022-09-21 ENCOUNTER — Inpatient Hospital Stay: Payer: No Typology Code available for payment source | Attending: Hematology and Oncology

## 2022-09-21 ENCOUNTER — Other Ambulatory Visit: Payer: Self-pay

## 2022-09-21 DIAGNOSIS — D509 Iron deficiency anemia, unspecified: Secondary | ICD-10-CM | POA: Insufficient documentation

## 2022-09-21 DIAGNOSIS — D500 Iron deficiency anemia secondary to blood loss (chronic): Secondary | ICD-10-CM

## 2022-09-21 LAB — CMP (CANCER CENTER ONLY)
ALT: 27 U/L (ref 0–44)
AST: 24 U/L (ref 15–41)
Albumin: 4 g/dL (ref 3.5–5.0)
Alkaline Phosphatase: 48 U/L (ref 38–126)
Anion gap: 9 (ref 5–15)
BUN: 10 mg/dL (ref 6–20)
CO2: 27 mmol/L (ref 22–32)
Calcium: 9.9 mg/dL (ref 8.9–10.3)
Chloride: 103 mmol/L (ref 98–111)
Creatinine: 0.7 mg/dL (ref 0.44–1.00)
GFR, Estimated: >60 mL/min (ref >60)
Glucose, Bld: 110 mg/dL — ABNORMAL HIGH (ref 70–99)
Potassium: 3.3 mmol/L — ABNORMAL LOW (ref 3.5–5.1)
Sodium: 139 mmol/L (ref 135–145)
Total Bilirubin: 0.3 mg/dL (ref 0.3–1.2)
Total Protein: 6.9 g/dL (ref 6.5–8.1)

## 2022-09-21 LAB — CBC WITH DIFFERENTIAL (CANCER CENTER ONLY)
Abs Immature Granulocytes: 0.01 10*3/uL (ref 0.00–0.07)
Basophils Absolute: 0 10*3/uL (ref 0.0–0.1)
Basophils Relative: 1 %
Eosinophils Absolute: 0 10*3/uL (ref 0.0–0.5)
Eosinophils Relative: 1 %
HCT: 32.4 % — ABNORMAL LOW (ref 36.0–46.0)
Hemoglobin: 10.3 g/dL — ABNORMAL LOW (ref 12.0–15.0)
Immature Granulocytes: 0 %
Lymphocytes Relative: 32 %
Lymphs Abs: 1.3 10*3/uL (ref 0.7–4.0)
MCH: 31.2 pg (ref 26.0–34.0)
MCHC: 31.8 g/dL (ref 30.0–36.0)
MCV: 98.2 fL (ref 80.0–100.0)
Monocytes Absolute: 0.4 10*3/uL (ref 0.1–1.0)
Monocytes Relative: 9 %
Neutro Abs: 2.4 10*3/uL (ref 1.7–7.7)
Neutrophils Relative %: 57 %
Platelet Count: 339 10*3/uL (ref 150–400)
RBC: 3.3 MIL/uL — ABNORMAL LOW (ref 3.87–5.11)
RDW: 15 % (ref 11.5–15.5)
WBC Count: 4.1 10*3/uL (ref 4.0–10.5)
nRBC: 0 % (ref 0.0–0.2)

## 2022-09-21 LAB — IRON AND IRON BINDING CAPACITY (CC-WL,HP ONLY)
Iron: 404 ug/dL — ABNORMAL HIGH (ref 28–170)
Saturation Ratios: 96 % — ABNORMAL HIGH (ref 10.4–31.8)
TIBC: 420 ug/dL (ref 250–450)
UIBC: 16 ug/dL — ABNORMAL LOW (ref 148–442)

## 2022-09-21 LAB — FERRITIN: Ferritin: 28 ng/mL (ref 11–307)

## 2022-09-27 ENCOUNTER — Telehealth: Payer: Self-pay | Admitting: Hematology and Oncology

## 2022-09-27 NOTE — Telephone Encounter (Signed)
Scheduled appointment per scheduling message. Patient is aware of the made appointment. 

## 2022-09-28 ENCOUNTER — Inpatient Hospital Stay (HOSPITAL_BASED_OUTPATIENT_CLINIC_OR_DEPARTMENT_OTHER): Payer: No Typology Code available for payment source | Admitting: Hematology and Oncology

## 2022-09-28 DIAGNOSIS — D500 Iron deficiency anemia secondary to blood loss (chronic): Secondary | ICD-10-CM

## 2022-09-28 NOTE — Assessment & Plan Note (Signed)
Lab review 12/02/2021: WBC 3.3, hemoglobin 7.3, MCV 69.8, RDW 18.7, ferritin 2.9, TIBC 495, iron saturation 3.4% 01/20/2022: WBC 3.0, hemoglobin 12, MCV 85, iron saturation 27%, ferritin 68 09/21/2022: WBC 4.1, hemoglobin 10.3, MCV 98.2, iron saturation 96%, ferritin 28   IV Iron: Sept 2023   The most likely cause of her anemia is heavy menstrual bleeding  The high iron saturation is related to taking oral iron supplementation just prior to the lab test. However the patient is still anemic in spite of reasonable iron levels. Recommend that we repeat labs in 3 months. If the hemoglobin drops to below 10 we might have to consider doing a bone marrow biopsy.

## 2022-09-28 NOTE — Progress Notes (Signed)
HEMATOLOGY-ONCOLOGY TELEPHONE VISIT PROGRESS NOTE  I connected with our patient on 09/28/22 at  8:45 AM EDT by telephone and verified that I am speaking with the correct person using two identifiers.  I discussed the limitations, risks, security and privacy concerns of performing an evaluation and management service by telephone and the availability of in person appointments.  I also discussed with the patient that there may be a patient responsible charge related to this service. The patient expressed understanding and agreed to proceed.   History of Present Illness: Ruth Schmidt 52 y.o. female is here because of Iron deficiency anemia. She presents to the clinic for a telephone follow-up to review labs.  She complains of mild fatigue but not severe as she used to.  She is taking oral iron supplementation.    REVIEW OF SYSTEMS:   Constitutional: Denies fevers, chills or abnormal weight loss All other systems were reviewed with the patient and are negative. Observations/Objective:     Assessment Plan:  Anemia, iron deficiency Lab review 12/02/2021: WBC 3.3, hemoglobin 7.3, MCV 69.8, RDW 18.7, ferritin 2.9, TIBC 495, iron saturation 3.4% 01/20/2022: WBC 3.0, hemoglobin 12, MCV 85, iron saturation 27%, ferritin 68 09/21/2022: WBC 4.1, hemoglobin 10.3, MCV 98.2, iron saturation 96%, ferritin 28   IV Iron: Sept 2023   The most likely cause of her anemia is heavy menstrual bleeding  The high iron saturation is related to taking oral iron supplementation just prior to the lab test. However the patient is still anemic in spite of reasonable iron levels. Recommend that we repeat labs in 3 months.       I discussed the assessment and treatment plan with the patient. The patient was provided an opportunity to ask questions and all were answered. The patient agreed with the plan and demonstrated an understanding of the instructions. The patient was advised to call back or seek an in-person  evaluation if the symptoms worsen or if the condition fails to improve as anticipated.   I provided 12 minutes of non-face-to-face time during this encounter.  This includes time for charting and coordination of care   Tamsen Meek, MD  I Janan Ridge am acting as a scribe for Dr.Danicka Hourihan  I have reviewed the above documentation for accuracy and completeness, and I agree with the above.

## 2022-09-29 ENCOUNTER — Telehealth: Payer: Self-pay | Admitting: Hematology and Oncology

## 2022-09-29 NOTE — Telephone Encounter (Signed)
Scheduled appointments per 7/10 los. Left voicemail for patient.

## 2022-09-30 ENCOUNTER — Telehealth: Payer: Self-pay | Admitting: Internal Medicine

## 2022-09-30 ENCOUNTER — Ambulatory Visit: Payer: PRIVATE HEALTH INSURANCE

## 2022-09-30 DIAGNOSIS — Z1231 Encounter for screening mammogram for malignant neoplasm of breast: Secondary | ICD-10-CM

## 2022-09-30 NOTE — Telephone Encounter (Signed)
She is needing her routine screening mammogram.

## 2022-09-30 NOTE — Telephone Encounter (Signed)
Patient went to get her mammogram done this morning and was told that they are not in her insurance.  She needs an order to be placed with [facility] - Phone:  [phone].  Please notify patient when this has been done.  Patient states that there are openings today.

## 2022-09-30 NOTE — Telephone Encounter (Signed)
Need to clarify-   ? Screening mammogram or diagnostic mammogram  thanks

## 2022-09-30 NOTE — Telephone Encounter (Signed)
Ok this is done 

## 2022-10-07 ENCOUNTER — Ambulatory Visit
Admission: RE | Admit: 2022-10-07 | Discharge: 2022-10-07 | Disposition: A | Discharge status: Home / Self Care | Payer: PRIVATE HEALTH INSURANCE | Source: Ambulatory Visit | Attending: Internal Medicine | Admitting: Internal Medicine

## 2022-10-07 DIAGNOSIS — Z1231 Encounter for screening mammogram for malignant neoplasm of breast: Secondary | ICD-10-CM

## 2022-10-11 ENCOUNTER — Other Ambulatory Visit: Payer: Self-pay | Admitting: Internal Medicine

## 2022-10-11 DIAGNOSIS — R928 Other abnormal and inconclusive findings on diagnostic imaging of breast: Secondary | ICD-10-CM

## 2022-10-18 ENCOUNTER — Other Ambulatory Visit: Payer: Self-pay | Admitting: Internal Medicine

## 2022-10-19 MED ORDER — CERTAVITE/ANTIOXIDANTS PO TABS
1.0000 | ORAL_TABLET | Freq: Every day | ORAL | 3 refills | Status: AC
  Filled 2022-10-19 – 2022-12-13 (×2): qty 90, 90d supply, fill #0
  Filled 2023-01-02: qty 30, 30d supply, fill #0

## 2022-10-20 ENCOUNTER — Ambulatory Visit: Payer: PRIVATE HEALTH INSURANCE

## 2022-10-20 ENCOUNTER — Other Ambulatory Visit (HOSPITAL_COMMUNITY): Payer: Self-pay

## 2022-10-20 ENCOUNTER — Ambulatory Visit
Admission: RE | Admit: 2022-10-20 | Discharge: 2022-10-20 | Disposition: A | Discharge status: Home / Self Care | Payer: PRIVATE HEALTH INSURANCE | Source: Ambulatory Visit | Attending: Internal Medicine | Admitting: Internal Medicine

## 2022-10-20 ENCOUNTER — Encounter: Payer: Self-pay | Admitting: Hematology and Oncology

## 2022-10-20 DIAGNOSIS — R928 Other abnormal and inconclusive findings on diagnostic imaging of breast: Secondary | ICD-10-CM

## 2022-10-20 NOTE — Progress Notes (Signed)
This encounter was created in error - please disregard.

## 2022-10-21 ENCOUNTER — Other Ambulatory Visit (HOSPITAL_COMMUNITY): Payer: Self-pay

## 2022-10-21 ENCOUNTER — Ambulatory Visit: Admit: 2022-10-21 | Discharge: 2022-10-21 | Payer: PRIVATE HEALTH INSURANCE | Attending: Adult Health | Primary: Family

## 2022-10-21 DIAGNOSIS — I10 Essential (primary) hypertension: Secondary | ICD-10-CM

## 2022-10-21 MED ORDER — DESVENLAFAXINE SUCCINATE ER 25 MG PO TB24
25 MG | ORAL_TABLET | Freq: Every day | ORAL | 1 refills | Status: DC
Start: 2022-10-21 — End: 2023-08-31

## 2022-10-21 MED ORDER — INDAPAMIDE 1.25 MG PO TABS
1.25 | ORAL_TABLET | Freq: Every day | ORAL | 3 refills | Status: AC
Start: 2022-10-21 — End: ?

## 2022-10-21 MED ORDER — AMLODIPINE BESYLATE 5 MG PO TABS
5 | ORAL_TABLET | ORAL | 3 refills | Status: AC
Start: 2022-10-21 — End: ?

## 2022-10-21 NOTE — Patient Instructions (Signed)
I will call you with any negative or abnormal lab work.

## 2022-10-21 NOTE — Progress Notes (Signed)
Chief Complaint   Patient presents with    New Patient     q4         Health Maintenance Due   Topic Date Due    Hepatitis B vaccine (1 of 3 - 3-dose series) Never done    COVID-19 Vaccine (1) Never done    Depression Screen  Never done    Hepatitis C screen  Never done    Lipids  Never done    Colorectal Cancer Screen  Never done    Shingles vaccine (2 of 2) 01/06/2022         "Have you been to the ER, urgent care clinic since your last visit?  Hospitalized since your last visit?"    NO    "Have you seen or consulted any other health care providers outside of [facility] since your last visit?"    Cardiology    "Have you had a colorectal cancer screening such as a colonoscopy/FIT/Cologuard?    NO    No colonoscopy on file  No cologuard on file  No FIT/FOBT on file   No flexible sigmoidoscopy on file

## 2022-10-21 NOTE — Progress Notes (Signed)
New Patient         HPI:     Michelle Benitez is a 52 y.o. year old female who presents today to the clinic to establish care and for routine follow-up and management of hypertension, anxiety and vitamin D deficiency.      Patient is new to the clinic and provider.    Patient has several complaints at today's visit.      Patient states that she thinks that she is in menopause although no one has ever told her.  She states that she had a LEEP procedure many years ago and she has not had a menstrual cycle since.  However she does endorse that she does have hot flashes and she states that she has gained over 30 pounds since 2021.    Patient endorses that she lifts weights and goes to the gym she also monitors her caloric intake and cannot understand why she is not losing weight.  Patient reports that this is the heaviest she has been in her life.  Patient's BMI is 29.85 which does not put her at the obesity mark just slightly below.     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  She also endorses that she just cannot find the motivation to clean her house and enjoy some of the previous activities and the life that she wants was enjoying.  Patient states that she had finished her doctorate in education and she is struggling with just not advancing and moving forward in her career path at this time.     Patient is denying any chest pain or shortness of breath.  However her blood pressure is 130/84 and she has not taken her medications this morning.  She stopped taking the ARB (valsartan) that she was prescribed because she ran out of it.  She is only taking amlodipine at 5 mg and Lozol at 1.25 mg daily.  She does have a blood pressure cuff at home however she is not  using it currently to monitor her blood pressure.    Additionally patient endorses that a few weeks ago she went to have a pedicure and since that time she has stubbed her left greater toe resulting in some removal of the toenail.  She is reporting bilateral toenail fungus, she is requesting medication.     Patient also states that she has an overactive bladder and that she takes a medication for it as well however she cannot recall the name of that medication at this time but reports she will look it up when she gets home and reach back out to me with the name.    In regards to the vitamin D deficiency, patient is not taking medication at this time.  Past medical history noted to also have genital herpes documented as a diagnosis patient endorses that she only had 1 outbreak and that she is not even sure if that is what it was however the provider did tell her that is what she had and she is only taken Valtrex x 1 and is currently not having to take it nor has she taken it since that first initial incident.  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 Repeat Pap in 1 year    Hypertension     Menorrhagia with irregular cycle 01/20/2014    Migraine     None in years, 3y, since have a piercing in upper ear after someone suggested this could help    Vitamin D deficiency 10/14/2013       Past Surgical History:   Procedure Laterality Date    CESAREAN SECTION  1995, 2012    x2    DILATION AND CURETTAGE OF UTERUS  04/14/14    LEEP  02/2013    with colposcopy    OTHER SURGICAL HISTORY  Right 2007, 02/2013    Eye surgery, ptyergium removal    TUBAL LIGATION  01/2011       Prior to Admission medications    Medication Sig Start Date End Date Taking? Authorizing Provider   amLODIPine (NORVASC) 5 MG tablet TAKE 1 TABLET BY MOUTH EVERY DAY  Indications: high blood pressure 10/21/22  Yes Neah Sporrer, Edwena Felty, APRN - CNP   indapamide (LOZOL) 1.25 MG tablet Take 1 tablet by mouth daily FOR HIGH BLOOD PRESSURE 10/21/22  Yes Korah Hufstedler, Edwena Felty, APRN - CNP   desvenlafaxine succinate (PRISTIQ) 25 MG TB24 extended release tablet Take 1 tablet by mouth daily 10/21/22  Yes Kesia Dalto, Edwena Felty, APRN - CNP   ibuprofen (ADVIL;MOTRIN) 800 MG tablet Take 1 tablet by mouth every 8 hours as needed for Pain 09/25/21 10/02/21  Ballachino, Joni Reining, APRN - NP   valACYclovir (VALTREX) 1 g tablet Take 1 tablet by mouth daily  Patient not taking: Reported on 10/21/2022 03/05/19   Automatic Reconciliation, Ar        Allergies   Allergen Reactions    Escitalopram Other (See Comments)     insomnia    Influenza Vaccines Swelling     Happened 2 years in a row.    Lisinopril Angioedema     Lip swelling on 07/10/13 while taking lisinopril-HCTZ        Social History     Socioeconomic History    Marital status: Single     Spouse name: Not on file    Number of children: Not on file    Years of education: Not on file    Highest education level: Not on file   Occupational History    Not on file   Tobacco Use    Smoking status: Never    Smokeless tobacco: Never   Substance and Sexual Activity    Alcohol use: Yes     Alcohol/week: 0.0 standard drinks of alcohol    Drug use: No    Sexual activity: Not on file     Comment: monogamous with partner since about 2014   Other Topics Concern    Not on file   Social History Narrative    Lives in [place] with two sons (one in  school at [employer]), one dtr (after [place]), mother  Originally from [place]     Earned EdD at [employer] in 2018 in education.      Social Determinants of Health     Financial Resource Strain: Low Risk   (10/21/2022)    Overall Financial Resource Strain (CARDIA)     Difficulty of Paying Living Expenses: Not hard at all   Food Insecurity: No Food Insecurity (10/21/2022)    Hunger Vital Sign     Worried About Running Out of Food in the Last Year: Never true     Ran Out of Food in the Last Year: Never true   Transportation Needs: Unknown (10/21/2022)    PRAPARE - Therapist, art (Medical): Not on file     Lack of Transportation (Non-Medical): No   Physical Activity: Not on file   Stress: Not on file   Social Connections: Not on file   Intimate Partner Violence: Not on file   Housing Stability: Unknown (10/21/2022)    Housing Stability Vital Sign     Unable to Pay for Housing in the Last Year: Not on file     Number of Places Lived in the Last Year: Not on file     Unstable Housing in the Last Year: No        ROS:     Review of Systems   Constitutional:  Positive for unexpected weight change.   HENT:  Negative for trouble swallowing and voice change.    Eyes:  Negative for visual disturbance.   Respiratory:  Negative for cough and shortness of breath.    Cardiovascular:  Negative for chest pain and leg swelling.   Gastrointestinal:  Negative for abdominal pain and blood in stool.   Endocrine: Negative for polydipsia, polyphagia and polyuria.   Genitourinary:  Positive for urgency. Negative for genital sores and vaginal bleeding.   Musculoskeletal:  Negative for arthralgias.   Neurological:  Negative for numbness and headaches.   Psychiatric/Behavioral:  Negative for suicidal ideas. The patient is nervous/anxious.          Physical Examination:   BP 130/84 (Site: Right Upper Arm, Position: Sitting, Cuff Size: Large Adult)   Pulse 74   Temp 97.9 F (36.6 C) (Temporal)   Resp 16   Ht 1.702 m (5\' 7" )   Wt 86.5 kg (190 lb 9.6 oz)   BMI 29.85 kg/m        Physical Exam  Vitals and nursing note reviewed.   Constitutional:       Appearance: Normal appearance.   HENT:      Head: Normocephalic.      Right  Ear: Tympanic membrane normal.      Left Ear: Tympanic membrane normal.      Nose: Nose normal.   Eyes:      Extraocular Movements: Extraocular movements intact.      Conjunctiva/sclera: Conjunctivae normal.      Pupils: Pupils are equal, round, and reactive to light.   Cardiovascular:      Rate and Rhythm: Normal rate and regular rhythm.      Pulses: Normal pulses.      Heart sounds: Normal heart sounds.   Pulmonary:      Effort: Pulmonary effort is normal.      Breath sounds: Normal breath sounds.   Abdominal:      General: Bowel sounds are normal.      Palpations: Abdomen is soft.   Musculoskeletal:  General: Normal range of motion.      Cervical back: Normal range of motion.   Feet:      Right foot:      Toenail Condition: Fungal disease present.     Left foot:      Toenail Condition: Fungal disease present.     Comments: Bilateral first digit fungus noted  Skin:     General: Skin is warm and dry.   Neurological:      General: No focal deficit present.      Mental Status: She is alert and oriented to person, place, and time. Mental status is at baseline.      Gait: Gait is intact.   Psychiatric:         Attention and Perception: Attention normal.         Mood and Affect: Mood is anxious.         Behavior: Behavior normal.         Thought Content: Thought content normal.         Judgment: Judgment normal.           Assessment/Plan:     1. Hypertension, unspecified type  Comments:  Controlled  Refilled lozol and amlodipine  Discontinued valsartan patient was not taking it  Encouraged to monitor BP at home  Orders:  -     CBC with Auto Differential; Future  -     Comprehensive Metabolic Panel; Future  -     Lipid Panel; Future  2. Excessive weight gain  Comments:  Uncontrolled  Discussed diet and exercise  Depression/anxiety could be cause  Discuss more in depth at next visit  Consider referral to nutritionist  Orders:  -     TSH; Future  3. Anxiety  Comments:  Uncontrolled  Started patient on Pristiq 25 mg  daily  Follow-up in 3 to 4 weeks for potential medication adjustment  4. Vitamin D deficiency  Comments:  Reported history of  Lab work pending  May need to add daily vitamin D3 and medication regimen  Orders:  -     Vitamin D 25 Hydroxy; Future  5. Onychomycosis of great toe  Comments:  Uncontrolled  Needs baseline liver enzymes before prescribing terbinafine  Will reach out to patient after labs have resulted  6. Screen for colon cancer  Comments:  Referral placed for colonoscopy  Orders:  -     [facility] - Jeral Fruit, MD, Gastroenterology, [place]  7. Need for hepatitis C screening test  Comments:  Labs pending  Recommended screening  Orders:  -     Hepatitis C Antibody; Future  8. Encounter for lipid screening for cardiovascular disease  Comments:  Annual lab work pending at this time  May need to add statin  Follow-up after lab work has resulted            No results found for any visits on 10/21/22.    Return in about 1 month (around 11/21/2022) for medication f/u , virtual.     I have reviewed the patient's medical history in detail and updated the computerized patient record.     We had a prolonged discussion about these complex clinical issues and went over the various important aspects to consider. All questions were answered.     She was given an after visit summary or informed of MyChart Access which includes patient instructions, diagnoses, current medications, & vitals.  She expressed understanding with the diagnosis and plan.  Signed,   Edwena Felty Rhetta Cleek MSN APRN AGNP-C

## 2022-10-22 LAB — CBC WITH AUTO DIFFERENTIAL
Basophils %: 1 % (ref 0–1)
Basophils Absolute: 0 10*3/uL (ref 0.0–0.1)
Eosinophils %: 1 % (ref 0–7)
Eosinophils Absolute: 0 10*3/uL (ref 0.0–0.4)
Hematocrit: 34.4 % — ABNORMAL LOW (ref 35.0–47.0)
Hemoglobin: 10.9 g/dL — ABNORMAL LOW (ref 11.5–16.0)
Immature Granulocytes %: 0 % (ref 0.0–0.5)
Immature Granulocytes Absolute: 0 10*3/uL (ref 0.00–0.04)
Lymphocytes %: 47 % (ref 12–49)
Lymphocytes Absolute: 1.3 10*3/uL (ref 0.8–3.5)
MCH: 30.4 PG (ref 26.0–34.0)
MCHC: 31.7 g/dL (ref 30.0–36.5)
MCV: 96.1 FL (ref 80.0–99.0)
MPV: 10.4 FL (ref 8.9–12.9)
Monocytes %: 11 % (ref 5–13)
Monocytes Absolute: 0.3 10*3/uL (ref 0.0–1.0)
Neutrophils %: 40 % (ref 32–75)
Neutrophils Absolute: 1.1 10*3/uL — ABNORMAL LOW (ref 1.8–8.0)
Nucleated RBCs: 0 PER 100 WBC
Platelets: 282 10*3/uL (ref 150–400)
RBC: 3.58 M/uL — ABNORMAL LOW (ref 3.80–5.20)
RDW: 15.5 % — ABNORMAL HIGH (ref 11.5–14.5)
WBC: 2.9 10*3/uL — ABNORMAL LOW (ref 3.6–11.0)
nRBC: 0 10*3/uL (ref 0.00–0.01)

## 2022-10-22 LAB — COMPREHENSIVE METABOLIC PANEL
ALT: 32 U/L (ref 12–78)
AST: 22 U/L (ref 15–37)
Albumin/Globulin Ratio: 1.1 (ref 1.1–2.2)
Albumin: 3.9 g/dL (ref 3.5–5.0)
Alk Phosphatase: 63 U/L (ref 45–117)
Anion Gap: 6 mmol/L (ref 5–15)
BUN/Creatinine Ratio: 18 (ref 12–20)
BUN: 17 MG/DL (ref 6–20)
CO2: 26 mmol/L (ref 21–32)
Calcium: 9.8 MG/DL (ref 8.5–10.1)
Chloride: 109 mmol/L — ABNORMAL HIGH (ref 97–108)
Creatinine: 0.93 MG/DL (ref 0.55–1.02)
Est, Glom Filt Rate: 74 mL/min/{1.73_m2} (ref >60)
Globulin: 3.7 g/dL (ref 2.0–4.0)
Glucose: 100 mg/dL (ref 65–100)
Potassium: 3.6 mmol/L (ref 3.5–5.1)
Sodium: 141 mmol/L (ref 136–145)
Total Bilirubin: 0.4 MG/DL (ref 0.2–1.0)
Total Protein: 7.6 g/dL (ref 6.4–8.2)

## 2022-10-22 LAB — LIPID PANEL
Chol/HDL Ratio: 2.8 (ref 0.0–5.0)
Cholesterol, Total: 205 MG/DL — ABNORMAL HIGH (ref <200)
HDL: 72 MG/DL
LDL Cholesterol: 123.6 MG/DL — ABNORMAL HIGH (ref 0–100)
Triglycerides: 47 MG/DL (ref <150)
VLDL Cholesterol Calculated: 9.4 MG/DL

## 2022-10-22 LAB — HEPATITIS C ANTIBODY
Hep C Ab Interp: NONREACTIVE
Hepatitis C Ab: 0.07 Index

## 2022-10-22 LAB — TSH: TSH, 3rd Generation: 0.45 u[IU]/mL (ref 0.36–3.74)

## 2022-10-22 LAB — VITAMIN D 25 HYDROXY: Vit D, 25-Hydroxy: 26.7 ng/mL — ABNORMAL LOW (ref 30–100)

## 2022-10-25 ENCOUNTER — Telehealth

## 2022-10-25 MED ORDER — CHOLECALCIFEROL 25 MCG (1000 UT) PO TABS
25 | ORAL_TABLET | Freq: Every day | ORAL | 1 refills | 84.00000 days | Status: DC
Start: 2022-10-25 — End: 2024-02-29

## 2022-10-25 MED ORDER — TERBINAFINE HCL 250 MG PO TABS
250 | ORAL_TABLET | Freq: Every day | ORAL | 0 refills | Status: AC
Start: 2022-10-25 — End: 2022-12-06

## 2022-10-25 NOTE — Telephone Encounter (Signed)
Prescription for vitamin D deficiency sent to pharmacy as well as prescription of terbinafine for toenail fungus.  Placed referral as well for nutritionist.  Add on tests for iron studies sent as well. Messaged patient in MyChart.

## 2022-10-26 LAB — IRON AND TIBC
Iron % Saturation: 15 % — ABNORMAL LOW (ref 20–50)
Iron: 52 ug/dL (ref 35–150)
TIBC: 356 ug/dL (ref 250–450)

## 2022-11-01 MED ORDER — FERROUS SULFATE 325 (65 FE) MG PO TABS
32565 (65 Fe) MG | ORAL_TABLET | ORAL | 0 refills | Status: AC
Start: 2022-11-01 — End: 2023-02-02

## 2022-11-01 NOTE — Telephone Encounter (Signed)
RX for Iron sent. MyChart message sent to patient.

## 2022-11-02 NOTE — Telephone Encounter (Signed)
ignore

## 2022-11-16 ENCOUNTER — Ambulatory Visit: Payer: PRIVATE HEALTH INSURANCE | Admitting: Obstetrics and Gynecology

## 2022-11-16 NOTE — Progress Notes (Deleted)
Pt in office for routine gyn exam with pap.

## 2022-11-17 NOTE — Telephone Encounter (Signed)
 Faxed lab results and last office note to [facility] per requested.  Fax confirmation received.

## 2022-12-02 ENCOUNTER — Other Ambulatory Visit: Payer: Self-pay | Admitting: Obstetrics and Gynecology

## 2022-12-09 ENCOUNTER — Inpatient Hospital Stay: Admit: 2022-12-09 | Payer: PRIVATE HEALTH INSURANCE | Attending: Registered" | Primary: Family

## 2022-12-09 DIAGNOSIS — E559 Vitamin D deficiency, unspecified: Secondary | ICD-10-CM

## 2022-12-09 MED ORDER — FERROUS SULFATE 325 (65 FE) MG PO TABS
325 (65 Fe) MG | ORAL_TABLET | ORAL | 0 refills | Status: DC
Start: 2022-12-09 — End: 2023-08-31

## 2022-12-09 NOTE — Progress Notes (Signed)
 [facility] - Outpatient Nutrition Services     Nutrition Assessment - Medical Nutrition Therapy   Outpatient Initial Evaluation         Patient Name: Michelle Benitez DOB: [birth year]   Treatment Diagnosis: E55.9 (ICD-10-CM) - Vitamin D deficiency   D64.9 (ICD-10-CM) - Anemia, unspecified type      Referral Source: Glorine Kate RAMAN, APRN * Start of Care [facility]): 12/09/2022     In time:   11:45am             Out time:   12:40pm   Total Treatment Time (min):   50     Gender: female Age: 52 y.o.   Ht: 67 in Wt: 194  lb  kg   BMI: 30.4 BMR   Female  BMR Female [id number]     Past Medical History:  Patient Active Problem List   Diagnosis    Genital herpes    Vitamin D deficiency    Anxiety    Hypertension    Urinary frequency    Anemia    Onychomycosis of great toe        Pertinent Medications:     Current Outpatient Medications:     ferrous sulfate  (IRON 325) 325 (65 Fe) MG tablet, Take 1 tablet by mouth three times a week for 40 doses Take 1 tablet by mouth 3 times per week (M, W, F)., Disp: 40 tablet, Rfl: 0    vitamin D (CHOLECALCIFEROL ) 25 MCG (1000 UT) TABS tablet, Take 2 tablets by mouth daily, Disp: 90 tablet, Rfl: 1    amLODIPine  (NORVASC ) 5 MG tablet, TAKE 1 TABLET BY MOUTH EVERY DAY  Indications: high blood pressure, Disp: 30 tablet, Rfl: 3    indapamide  (LOZOL ) 1.25 MG tablet, Take 1 tablet by mouth daily FOR HIGH BLOOD PRESSURE, Disp: 30 tablet, Rfl: 3    desvenlafaxine  succinate (PRISTIQ ) 25 MG TB24 extended release tablet, Take 1 tablet by mouth daily, Disp: 30 tablet, Rfl: 1    ibuprofen  (ADVIL ;MOTRIN ) 800 MG tablet, Take 1 tablet by mouth every 8 hours as needed for Pain, Disp: 21 tablet, Rfl: 0    valACYclovir (VALTREX) 1 g tablet, Take 1 tablet by mouth daily (Patient not taking: Reported on 10/21/2022), Disp: , Rfl:      Biochemical Data:   No results found for: LABA1C  Lab Results   Component Value Date    NA 141 10/21/2022    K 3.6 10/21/2022    CL 109 (H) 10/21/2022    CO2 26 10/21/2022    BUN 17  10/21/2022    CREATININE 0.93 10/21/2022    GLUCOSE 100 10/21/2022    CALCIUM 9.8 10/21/2022    BILITOT 0.4 10/21/2022    ALKPHOS 63 10/21/2022    AST 22 10/21/2022    ALT 32 10/21/2022    LABGLOM 74 10/21/2022    GFRAA >60 02/07/2018    AGRATIO 1.0 (L) 02/07/2018    GLOB 3.7 10/21/2022     Lab Results   Component Value Date/Time    VITD25 26.7 10/21/2022 11:45 AM          Component  Ref Range & Units 10/21/22 1145   Iron  35 - 150 ug/dL 52   TIBC  749 - 549 ug/dL 643   Iron % Saturation  20 - 50 % 15 Low         Lab Results   Component Value Date    WBC 2.9 (L) 10/21/2022  HGB 10.9 (L) 10/21/2022    HCT 34.4 (L) 10/21/2022    MCV 96.1 10/21/2022    PLT 282 10/21/2022     No results found for: FERRITIN    Wt Readings from Last 15 Encounters:   10/21/22 86.5 kg (190 lb 9.6 oz)   09/25/21 82.4 kg (181 lb 10.5 oz)   03/07/18 76.7 kg (169 lb)   (  BP Readings from Last 3 Encounters:   10/21/22 130/84   09/25/21 (!) 149/107   03/07/18 132/84     Lab Results   Component Value Date    VITAMINB12 413 10/06/2017     No results found for: FOLATE    Lab Results   Component Value Date    CHOL 205 (H) 10/21/2022    TRIG 47 10/21/2022    HDL 72 10/21/2022    LDL 123.6 (H) 10/21/2022    VLDL 9.4 10/21/2022    CHOLHDLRATIO 2.8 10/21/2022        Assessment:   Pt notes recently gaining weight. Pt notes menopause type symptoms. Hiostory of vitamin D deficiency and anemia.     Pt notes exercising a lot and then if not seeing any changes will feel deprssed.   Tries to not eat a lot of starches.   Pescatarian.   Trying to follow a high protein diet.       UBW was 165# . Weight gain. Started going to the gym more often and was ale to loose weight. Then was getting dizzy at the gym and would stop and regain.     Activity: aiming for daily. At least 2 times per week to gym (lifting and lower body or calisthenics at home for )  Sitting a lot. Does a lot of driving.     Notices puffy ankles. Does not use a salt shaker. Does use  sesonings with salt.     Notes urinating frequently and making sleep difficult. Reports drinking large amounts of water in a single sitting.     Notes feeling depressed. Interested in seeing a therapist/counselor.      Food & Nutrition: B- skip sometimes.   OR 2 eggs + coffee (unknown amount of creamer + zero sugar sweetener)   S- nuts (not measured. Almost half a bag of cashews) or fruits  L- skip sometimes.   S- anytime passing by   D- salmon or shrimp OR chick peas sometimes with rice or cauliflower rice or cuscus  S- new pattern - mini ice cream bars (eating multiple in a day)   Recently craving sugary things.   Drinks: drinking large amounts of water all at once. Avoiding drinking at other times due to urination.     Going for 2nds and thirds and unsure if hungry.     When starting to feel depressed will eat whatever is available. Example: more cookies or fruits (everytime passing by the table- then will feel mentally bad about it).   Thinks she may eat too much of the right things. Sometimes 3 bananas ina day.     Has kept a food log in the past. More conscious of what she was eating.     Thinking about stopping eating seafood in the futire. Currently ok with eating fish and shellfish.        Estimate Needs:    Equation( [x]  MSJ ; []   HBE; []  Cunningham; []  other)  * Activity Factor (1.4) -500   Calories:  1640 Protein: 70-103 Carbs: 185-200 Fat: 55  Kcal/day  g/day  g/day  g/day        percent: 25  45  30               Nutrition Diagnosis Excessive energy intake R/T self monitoring deficit AEB unintended weight gain of 4# over 1 month, BMI> 24, pt reports feeling she is eating large amounts of food and does not feel always in control of this choices, guilt around consuming it after. Pt reports sometimes eating 4 bananas in a day unrelated to hunger.     Inadequate iron intake R/T taste preferences AEB pt has been limiting starches including grains and meat in her diet which are high sources or iron, pts work  on weight loss has also made overall food intake inconsistent and unlikely to be consuming adequate amount.    Nutrition Intervention &  Education: Worked with pt to create meal plan with meal builder. Vegetarian version.   Educated on vitamin D sources  Pt currently taking 2000 IU and additional multivitamin with about 800 IU per day.   Working on improving food choices and consistency which would provide more consistent iron intake to support anemia. Currently on a  supplement.     Educated on using salt free seasonings based on elevated blood pressure. Pt unsure how much salt is currently in her seasonings at home.    Handouts Provided: []   Carbohydrates  []   Protein  []   Non-starchy Vegetables  []   Food Label  [x]   Meal and Snack Ideas  []   Food Journals []   Diabetes  []   Cholesterol  []   Sodium  []   Gen Nutr Guidelines  []   SBGM Guidelines  []   Others:   Information Reviewed with: Patient    Readiness to Change Stage: []   Pre-contemplative    []   Contemplative  [x]   Preparation               []   Action                  []   Maintenance   Potential Barriers to Learning: []   Decline in memory    []   Language barrier   []   Other:  [x]   Emotional                  []   Limited mobility     []   None  [x]   Lack of motivation     []  Vision, hearing or cognitive impairment   Expected Compliance: []   Good      [x]   Fair   []   Poor  Exp:     Nutritional Goal and Monitoring - To promote lifestyle changes to result in:    [x]   Weight loss  []   Improved diabetic control  []   Decreased cholesterol levels  []   Decreased blood pressure  []   Weight maintenance []   Preventing any interactions associated with food allergies  []   Adequate weight gain toward goal weight  []   Other:        Patient Goals:   - use meal builder to plan out 3 meals with 1-2 snacks per day  -use salt free seasonings list provided  -drink water 8oz at a time. Spread out over the day, do not chug water in sone sitting. Stop drinking around 6pm and only sip  until bed.      Dietitian Signature: Stefano Roux, MS, RD, CSSD Date: 12/09/2022   Follow-up: 1 month Time: 11:50 AM

## 2022-12-13 ENCOUNTER — Other Ambulatory Visit (HOSPITAL_COMMUNITY): Payer: Self-pay

## 2022-12-13 ENCOUNTER — Encounter: Payer: Self-pay | Admitting: Hematology and Oncology

## 2022-12-14 ENCOUNTER — Other Ambulatory Visit (HOSPITAL_COMMUNITY): Payer: Self-pay

## 2022-12-15 ENCOUNTER — Encounter: Payer: Self-pay | Admitting: Hematology and Oncology

## 2022-12-23 ENCOUNTER — Encounter: Payer: Self-pay | Admitting: Obstetrics and Gynecology

## 2022-12-23 NOTE — Progress Notes (Signed)
Pt rescheduled

## 2022-12-26 ENCOUNTER — Other Ambulatory Visit (HOSPITAL_COMMUNITY): Payer: Self-pay

## 2022-12-29 ENCOUNTER — Inpatient Hospital Stay: Payer: No Typology Code available for payment source | Attending: Hematology and Oncology

## 2022-12-29 DIAGNOSIS — N920 Excessive and frequent menstruation with regular cycle: Secondary | ICD-10-CM | POA: Insufficient documentation

## 2022-12-29 DIAGNOSIS — D500 Iron deficiency anemia secondary to blood loss (chronic): Secondary | ICD-10-CM | POA: Diagnosis present

## 2022-12-29 LAB — CBC WITH DIFFERENTIAL (CANCER CENTER ONLY)
Abs Immature Granulocytes: 0 10*3/uL (ref 0.00–0.07)
Basophils Absolute: 0 10*3/uL (ref 0.0–0.1)
Basophils Relative: 0 %
Eosinophils Absolute: 0 10*3/uL (ref 0.0–0.5)
Eosinophils Relative: 1 %
HCT: 38.8 % (ref 36.0–46.0)
Hemoglobin: 12.4 g/dL (ref 12.0–15.0)
Immature Granulocytes: 0 %
Lymphocytes Relative: 33 %
Lymphs Abs: 1.1 10*3/uL (ref 0.7–4.0)
MCH: 31.5 pg (ref 26.0–34.0)
MCHC: 32 g/dL (ref 30.0–36.0)
MCV: 98.5 fL (ref 80.0–100.0)
Monocytes Absolute: 0.3 10*3/uL (ref 0.1–1.0)
Monocytes Relative: 10 %
Neutro Abs: 1.8 10*3/uL (ref 1.7–7.7)
Neutrophils Relative %: 56 %
Platelet Count: 272 10*3/uL (ref 150–400)
RBC: 3.94 MIL/uL (ref 3.87–5.11)
RDW: 11.8 % (ref 11.5–15.5)
WBC Count: 3.3 10*3/uL — ABNORMAL LOW (ref 4.0–10.5)
nRBC: 0 % (ref 0.0–0.2)

## 2022-12-29 LAB — CMP (CANCER CENTER ONLY)
ALT: 33 U/L (ref 0–44)
AST: 25 U/L (ref 15–41)
Albumin: 4.5 g/dL (ref 3.5–5.0)
Alkaline Phosphatase: 45 U/L (ref 38–126)
Anion gap: 5 (ref 5–15)
BUN: 13 mg/dL (ref 6–20)
CO2: 30 mmol/L (ref 22–32)
Calcium: 10.2 mg/dL (ref 8.9–10.3)
Chloride: 102 mmol/L (ref 98–111)
Creatinine: 0.69 mg/dL (ref 0.44–1.00)
GFR, Estimated: >60 mL/min (ref >60)
Glucose, Bld: 89 mg/dL (ref 70–99)
Potassium: 3.9 mmol/L (ref 3.5–5.1)
Sodium: 137 mmol/L (ref 135–145)
Total Bilirubin: 0.4 mg/dL (ref 0.3–1.2)
Total Protein: 8 g/dL (ref 6.5–8.1)

## 2022-12-29 LAB — IRON AND IRON BINDING CAPACITY (CC-WL,HP ONLY)
Iron: 68 ug/dL (ref 28–170)
Saturation Ratios: 17 % (ref 10.4–31.8)
TIBC: 410 ug/dL (ref 250–450)
UIBC: 342 ug/dL (ref 148–442)

## 2022-12-29 LAB — FERRITIN: Ferritin: 23 ng/mL (ref 11–307)

## 2022-12-31 NOTE — Progress Notes (Unsigned)
[facility] Cancer Follow up:    Ruth Levins, MD [address] [place] [address]   DIAGNOSIS: iron deficiency anemia  I connected with Ruth Schmidt on 12/31/22 at 10:45 AM EDT by telephone and verified that I am speaking with the correct person using two identifiers.  I discussed the limitations, risks, security and privacy concerns of performing an evaluation and management service by telephone and the availability of in person appointments.  I also discussed with the patient that there may be a patient responsible charge related to this service. The patient expressed understanding and agreed to proceed.  Patient location: home Provider location: [facility] office  SUMMARY OF HEMATOLOGIC HISTORY: Lab review 12/02/2021: WBC 3.3, hemoglobin 7.3, MCV 69.8, RDW 18.7, ferritin 2.9, TIBC 495, iron saturation 3.4% 01/20/2022: WBC 3.0, hemoglobin 12, MCV 85, iron saturation 27%, ferritin 68 09/21/2022: WBC 4.1, hemoglobin 10.3, MCV 98.2, iron saturation 96%, ferritin 28 12/29/2022: WBC 3.3, hemoglobin 12.4, MCV 98.5, iron saturation 17%, ferritin 23   IV Iron: Sept 2023; oral iron  CURRENT THERAPY:oral iron  INTERVAL HISTORY: Ruth Schmidt 52 y.o. female returns for f/u of her iron deficiency secondary to heavy menstrual bleeding.     Patient Active Problem List   Diagnosis Date Noted   Peripheral edema 06/30/2021   Menorrhagia 08/24/2020   Left lumbar radiculopathy 07/25/2020   Periumbilical hernia 07/22/2020   Vitamin D deficiency 04/30/2018   Hyperpigmentation 09/26/2013   Anemia, iron deficiency 01/04/2013   HTN (hypertension)    Preventative health care 07/24/2012    has No Known Allergies.  MEDICAL HISTORY: Past Medical History:  Diagnosis Date   Abnormal Pap smear of cervix    Anemia    on iron daily for tx   Hypertension    on med   Lumbar herniated disc     SURGICAL HISTORY: Past Surgical History:  Procedure Laterality Date   CESAREAN  SECTION     COLONOSCOPY  09/24/2021   TUBAL LIGATION Bilateral 03/22/2003   UMBILICAL HERNIA REPAIR N/A 10/19/2020   Procedure: UMBILICAL HERNIA REPAIR WITH MESH;  Surgeon: Griselda Miner, MD;  Location: [facility];  Service: General;  Laterality: N/A;    SOCIAL HISTORY: Social History   Socioeconomic History   Marital status: Divorced    Spouse name: Not on file   Number of children: 3   Years of education: Not on file   Highest education level: Not on file  Occupational History   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   Tobacco Use   Smoking status: Never   Smokeless tobacco: Never  Vaping Use   Vaping status: Never Used  Substance and Sexual Activity   Alcohol use: Yes    Alcohol/week: 0.0 - 7.0 standard drinks of alcohol    Comment: wine   Drug use: Never   Sexual activity: Yes    Partners: Male    Birth control/protection: Surgical    Comment: Tubal Ligation   Other Topics Concern   Not on file  Social History Narrative   Single parent - 3 kids   Divorced   Self -employed, 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)   Social Determinants of Health   Financial Resource Strain: Not on file  Food Insecurity: Not on file  Transportation Needs: Not on file  Physical Activity: Not on file  Stress: Not on file  Social Connections: Not on file  Intimate Partner Violence: Not  on file    FAMILY HISTORY: Family History  Problem Relation Age of Onset   Hypertension Mother    Prostate cancer Father    Hyperlipidemia Father    Stroke Maternal Aunt 21   Breast cancer Maternal Aunt 68   Lung cancer Maternal Uncle    Colon cancer Maternal Uncle    Hypertension Maternal Grandmother    Coronary artery disease Maternal Grandfather 8       MI, sudden death   Diabetes Paternal Grandmother    Cancer Other    Stomach cancer Neg Hx    Rectal cancer Neg Hx    Esophageal cancer Neg Hx    Colon polyps Neg Hx     Review of Systems   Constitutional:  Negative for appetite change, chills, fatigue, fever and unexpected weight change.  HENT:   Negative for hearing loss, lump/mass and trouble swallowing.   Eyes:  Negative for eye problems and icterus.  Respiratory:  Negative for chest tightness, cough and shortness of breath.   Cardiovascular:  Negative for chest pain, leg swelling and palpitations.  Gastrointestinal:  Negative for abdominal distention, abdominal pain, constipation, diarrhea, nausea and vomiting.  Endocrine: Negative for hot flashes.  Genitourinary:  Negative for difficulty urinating.   Musculoskeletal:  Negative for arthralgias.  Skin:  Negative for itching and rash.  Neurological:  Negative for dizziness, extremity weakness, headaches and numbness.  Hematological:  Negative for adenopathy. Does not bruise/bleed easily.  Psychiatric/Behavioral:  Negative for depression. The patient is not nervous/anxious.   All other systems reviewed and are negative.     PHYSICAL EXAMINATION  Patient sounds well.  She is in no apparent distress, mood and behavior are normal.  Speech is normal.  Breathing is non-labored.    LABORATORY DATA:  CBC    Component Value Date/Time   WBC 3.3 (L) 12/29/2022 1215   WBC 4.0 04/22/2022 1159   RBC 3.94 12/29/2022 1215   HGB 12.4 12/29/2022 1215   HCT 38.8 12/29/2022 1215   PLT 272 12/29/2022 1215   MCV 98.5 12/29/2022 1215   MCH 31.5 12/29/2022 1215   MCHC 32.0 12/29/2022 1215   RDW 11.8 12/29/2022 1215   LYMPHSABS 1.1 12/29/2022 1215   MONOABS 0.3 12/29/2022 1215   EOSABS 0.0 12/29/2022 1215   BASOSABS 0.0 12/29/2022 1215    CMP     Component Value Date/Time   NA 137 12/29/2022 1215   K 3.9 12/29/2022 1215   CL 102 12/29/2022 1215   CO2 30 12/29/2022 1215   GLUCOSE 89 12/29/2022 1215   BUN 13 12/29/2022 1215   CREATININE 0.69 12/29/2022 1215   CALCIUM 10.2 12/29/2022 1215   PROT 8.0 12/29/2022 1215   ALBUMIN 4.5 12/29/2022 1215   AST 25 12/29/2022 1215    ALT 33 12/29/2022 1215   ALKPHOS 45 12/29/2022 1215   BILITOT 0.4 12/29/2022 1215   GFRNONAA >60 12/29/2022 1215   GFRAA >90 03/26/2012 0820       ASSESSMENT and THERAPY PLAN:   No problem-specific Assessment & Plan notes found for this encounter.    Follow up instructions:    -Return to cancer center in 3 months for labs and virtual f/u afterward   The patient was provided an opportunity to ask questions and all were answered. The patient agreed with the plan and demonstrated an understanding of the instructions.   The patient was advised to call back or seek an in-person evaluation if the symptoms worsen or if the condition  fails to improve as anticipated.   I provided *** minutes of {Blank single:19197::"face-to-face video visit time","non face-to-face telephone visit time"} during this encounter, and > 50% was spent counseling as documented under my assessment & plan.  Lillard Anes, NP 12/31/22 3:31 PM Medical Oncology and Hematology [facility] [address] [place], [place] [address] Tel. [phone]    Fax. [phone]

## 2023-01-02 ENCOUNTER — Other Ambulatory Visit: Payer: Self-pay

## 2023-01-02 ENCOUNTER — Other Ambulatory Visit (HOSPITAL_COMMUNITY): Payer: Self-pay

## 2023-01-02 ENCOUNTER — Inpatient Hospital Stay (HOSPITAL_BASED_OUTPATIENT_CLINIC_OR_DEPARTMENT_OTHER): Payer: No Typology Code available for payment source | Admitting: Adult Health

## 2023-01-02 ENCOUNTER — Encounter: Payer: Self-pay | Admitting: Hematology and Oncology

## 2023-01-02 ENCOUNTER — Telehealth: Payer: Self-pay

## 2023-01-02 DIAGNOSIS — D500 Iron deficiency anemia secondary to blood loss (chronic): Secondary | ICD-10-CM

## 2023-01-02 MED ORDER — IRON (FERROUS SULFATE) 75 (15 FE) MG/ML PO SOLN: ORAL | 1 refills | Status: AC

## 2023-01-02 NOTE — Telephone Encounter (Signed)
Enter in error

## 2023-01-02 NOTE — Assessment & Plan Note (Signed)
Is a 52 year old with iron deficiency anemia secondary to menorrhagia.    Her hemoglobin and iron studies are improved and she continues on oral iron daily.  I reviewed her previous GI studies that could not find an etiology for her iron deficiency.  We discussed her heavy menstrual cycles and she has an appointment on October 24 to discuss this with OB/GYN.  I let her know that once the menorrhagia is fixed she will stop losing iron.  For now she will continue on oral iron daily and I sent in a refill to her pharmacy.  She will continue with lab and virtual follow-up in 12 weeks time however if her hemoglobin and iron studies are stable we can stretch that out to every 6 months.  Angellina verbalized understanding of the above and knows to call for any questions or concerns.

## 2023-01-10 ENCOUNTER — Encounter: Payer: PRIVATE HEALTH INSURANCE | Attending: Registered" | Primary: Family

## 2023-01-13 ENCOUNTER — Other Ambulatory Visit (HOSPITAL_COMMUNITY): Payer: Self-pay

## 2023-01-25 ENCOUNTER — Encounter: Payer: PRIVATE HEALTH INSURANCE | Attending: Registered" | Primary: Family

## 2023-02-24 ENCOUNTER — Inpatient Hospital Stay: Payer: PRIVATE HEALTH INSURANCE | Attending: Registered" | Primary: Family

## 2023-04-25 ENCOUNTER — Ambulatory Visit: Payer: No Typology Code available for payment source | Admitting: Internal Medicine

## 2023-04-25 ENCOUNTER — Encounter: Payer: Self-pay | Admitting: Internal Medicine

## 2023-04-25 VITALS — BP 134/82 | HR 82 | Temp 98.5°F | Ht 63.0 in | Wt 156.0 lb

## 2023-04-25 DIAGNOSIS — I10 Essential (primary) hypertension: Secondary | ICD-10-CM

## 2023-04-25 DIAGNOSIS — D500 Iron deficiency anemia secondary to blood loss (chronic): Secondary | ICD-10-CM

## 2023-04-25 DIAGNOSIS — R739 Hyperglycemia, unspecified: Secondary | ICD-10-CM

## 2023-04-25 DIAGNOSIS — E559 Vitamin D deficiency, unspecified: Secondary | ICD-10-CM

## 2023-04-25 DIAGNOSIS — E538 Deficiency of other specified B group vitamins: Secondary | ICD-10-CM | POA: Diagnosis not present

## 2023-04-25 DIAGNOSIS — Z0000 Encounter for general adult medical examination without abnormal findings: Secondary | ICD-10-CM | POA: Diagnosis not present

## 2023-04-25 DIAGNOSIS — D509 Iron deficiency anemia, unspecified: Secondary | ICD-10-CM | POA: Diagnosis not present

## 2023-04-25 DIAGNOSIS — Z0001 Encounter for general adult medical examination with abnormal findings: Secondary | ICD-10-CM

## 2023-04-25 LAB — CBC WITH DIFFERENTIAL/PLATELET
Basophils Absolute: 0 10*3/uL (ref 0.0–0.1)
Basophils Relative: 0.4 % (ref 0.0–3.0)
Eosinophils Absolute: 0 10*3/uL (ref 0.0–0.7)
Eosinophils Relative: 0.6 % (ref 0.0–5.0)
HCT: 40.9 % (ref 36.0–46.0)
Hemoglobin: 13.2 g/dL (ref 12.0–15.0)
Lymphocytes Relative: 37.5 % (ref 12.0–46.0)
Lymphs Abs: 1.1 10*3/uL (ref 0.7–4.0)
MCHC: 32.2 g/dL (ref 30.0–36.0)
MCV: 97.3 fL (ref 78.0–100.0)
Monocytes Absolute: 0.3 10*3/uL (ref 0.1–1.0)
Monocytes Relative: 11.9 % (ref 3.0–12.0)
Neutro Abs: 1.4 10*3/uL (ref 1.4–7.7)
Neutrophils Relative %: 49.6 % (ref 43.0–77.0)
Platelets: 232 10*3/uL (ref 150.0–400.0)
RBC: 4.21 Mil/uL (ref 3.87–5.11)
RDW: 13.7 % (ref 11.5–15.5)
WBC: 2.8 10*3/uL — ABNORMAL LOW (ref 4.0–10.5)

## 2023-04-25 LAB — TSH: TSH: 2.6 u[IU]/mL (ref 0.35–5.50)

## 2023-04-25 LAB — IBC PANEL
Iron: 137 ug/dL (ref 42–145)
Saturation Ratios: 35.8 % (ref 20.0–50.0)
TIBC: 382.2 ug/dL (ref 250.0–450.0)
Transferrin: 273 mg/dL (ref 212.0–360.0)

## 2023-04-25 LAB — HEPATIC FUNCTION PANEL
ALT: 22 U/L (ref 0–35)
AST: 22 U/L (ref 0–37)
Albumin: 4.4 g/dL (ref 3.5–5.2)
Alkaline Phosphatase: 48 U/L (ref 39–117)
Bilirubin, Direct: 0.1 mg/dL (ref 0.0–0.3)
Total Bilirubin: 0.4 mg/dL (ref 0.2–1.2)
Total Protein: 7.7 g/dL (ref 6.0–8.3)

## 2023-04-25 LAB — LIPID PANEL
Cholesterol: 218 mg/dL — ABNORMAL HIGH (ref 0–200)
HDL: 65.7 mg/dL (ref >39.00)
LDL Cholesterol: 133 mg/dL — ABNORMAL HIGH (ref 0–99)
NonHDL: 152.44
Total CHOL/HDL Ratio: 3
Triglycerides: 98 mg/dL (ref 0.0–149.0)
VLDL: 19.6 mg/dL (ref 0.0–40.0)

## 2023-04-25 LAB — URINALYSIS, ROUTINE W REFLEX MICROSCOPIC
Bilirubin Urine: NEGATIVE
Hgb urine dipstick: NEGATIVE
Ketones, ur: NEGATIVE
Leukocytes,Ua: NEGATIVE
Nitrite: NEGATIVE
RBC / HPF: NONE SEEN (ref 0–?)
Specific Gravity, Urine: 1.015 (ref 1.000–1.030)
Total Protein, Urine: NEGATIVE
Urine Glucose: NEGATIVE
Urobilinogen, UA: 0.2 (ref 0.0–1.0)
WBC, UA: NONE SEEN (ref 0–?)
pH: 6.5 (ref 5.0–8.0)

## 2023-04-25 LAB — BASIC METABOLIC PANEL
BUN: 9 mg/dL (ref 6–23)
CO2: 29 meq/L (ref 19–32)
Calcium: 9.7 mg/dL (ref 8.4–10.5)
Chloride: 101 meq/L (ref 96–112)
Creatinine, Ser: 0.7 mg/dL (ref 0.40–1.20)
GFR: 99.77 mL/min (ref >60.00)
Glucose, Bld: 89 mg/dL (ref 70–99)
Potassium: 4 meq/L (ref 3.5–5.1)
Sodium: 137 meq/L (ref 135–145)

## 2023-04-25 LAB — VITAMIN D 25 HYDROXY (VIT D DEFICIENCY, FRACTURES): VITD: 24.09 ng/mL — ABNORMAL LOW (ref 30.00–100.00)

## 2023-04-25 LAB — VITAMIN B12: Vitamin B-12: 650 pg/mL (ref 211–911)

## 2023-04-25 LAB — FERRITIN: Ferritin: 20.9 ng/mL (ref 10.0–291.0)

## 2023-04-25 LAB — HEMOGLOBIN A1C: Hgb A1c MFr Bld: 5 % (ref 4.6–6.5)

## 2023-04-25 NOTE — Assessment & Plan Note (Signed)
Age and sex appropriate education and counseling updated with regular exercise and diet Referrals for preventative services - none needed Immunizations addressed - declines covid booster and flu shot, for shingrix at pharmacy Smoking counseling  - none needed Evidence for depression or other mood disorder - none significant Most recent labs reviewed. I have personally reviewed and have noted: 1) the patient's medical and social history 2) The patient's current medications and supplements 3) The patient's height, weight, and BMI have been recorded in the chart

## 2023-04-25 NOTE — Patient Instructions (Signed)

## 2023-04-25 NOTE — Assessment & Plan Note (Signed)
For f/u cbc and iron lab today, cont iron supplement

## 2023-04-25 NOTE — Progress Notes (Signed)
 Patient ID: Ruth Schmidt, female   DOB: June 29, 1970, 53 y.o.   MRN: [id number]         Chief Complaint:: wellness exam and iron  deficiency anemia, htn, low vit d       HPI:  Ruth Schmidt is a 53 y.o. female here for wellness exam; decliens covid booster, for shingrx at pharmacy, declines flu shot, o/w up to date               Also has seen GYN with several labs recently for STD and hormone returned normal.  Pt states some ongoing discussion taking place regarding ablation vs TAH, but not yet definite plan in light of ongoing menorrhagia and fibroids x 6 per pt. Pt denies chest pain, increased sob or doe, wheezing, orthopnea, PND, increased LE swelling, palpitations, dizziness or syncope.   Pt denies polydipsia, polyuria, or new focal neuro s/s.    Pt denies fever, wt loss, night sweats, loss of appetite, or other constitutional symptoms   most recent Hgb normal.  Pt declines iron  infusion on recheck of iron  today if low.    Oral liquid iron  leads to less constipation but not covered by insurance.     Wt Readings from Last 3 Encounters:  04/25/23 156 lb (70.8 kg)  04/22/22 153 lb (69.4 kg)  01/20/22 145 lb (65.8 kg)   BP Readings from Last 3 Encounters:  04/25/23 134/82  04/22/22 128/74  01/20/22 136/80   Immunization History  Administered Date(s) Administered   PFIZER(Purple Top)SARS-COV-2 Vaccination 05/24/2019, 06/14/2019   Tdap 03/21/2006, 06/28/2017   Health Maintenance Due  Topic Date Due   Zoster Vaccines- Shingrix (1 of 2) Never done   COVID-19 Vaccine (3 - 2024-25 season) 11/20/2022      Past Medical History:  Diagnosis Date   Abnormal Pap smear of cervix    Anemia    on iron  daily for tx   Hypertension    on med   Lumbar herniated disc    Past Surgical History:  Procedure Laterality Date   CESAREAN SECTION     COLONOSCOPY  09/24/2021   TUBAL LIGATION Bilateral 03/22/2003   UMBILICAL HERNIA REPAIR N/A 10/19/2020   Procedure: UMBILICAL HERNIA REPAIR WITH MESH;   Surgeon: Curvin Deward MOULD, MD;  Location: [facility];  Service: General;  Laterality: N/A;    reports that she has never smoked. She has never used smokeless tobacco. She reports current alcohol use. She reports that she does not use drugs. family history includes Breast cancer (age of onset: 24) in her maternal aunt; Cancer in an other family member; Colon cancer in her maternal uncle; Coronary artery disease (age of onset: 8) in her maternal grandfather; Diabetes in her paternal grandmother; Hyperlipidemia in her father; Hypertension in her maternal grandmother and mother; Lung cancer in her maternal uncle; Prostate cancer in her father; Stroke (age of onset: 48) in her maternal aunt. No Known Allergies Current Outpatient Medications on File Prior to Visit  Medication Sig Dispense Refill   amLODipine  (NORVASC ) 5 MG tablet Take 1 tablet (5 mg total) by mouth daily. 90 tablet 3   Cholecalciferol (THERA-D 2000) 50 MCG (2000 UT) TABS 1 tab by mouth once daily 30 tablet 99   hydrochlorothiazide  (HYDRODIURIL ) 25 MG tablet Take 1 tablet (25 mg total) by mouth daily. 90 tablet 3   Iron , Ferrous Sulfate , 75 (15 Fe) MG/ML SOLN Take 5 ml oral once daily 450 mL 1   Multiple Vitamins-Minerals (CERTAVITE/ANTIOXIDANTS) TABS  Take 1 tablet by mouth daily. 90 tablet 3   No current facility-administered medications on file prior to visit.        ROS:  All others reviewed and negative.  Objective        PE:  BP 134/82 (BP Location: Right Arm, Patient Position: Sitting, Cuff Size: Normal)   Pulse 82   Temp 98.5 F (36.9 C) (Oral)   Ht 5' 3 (1.6 m)   Wt 156 lb (70.8 kg)   LMP 04/05/2023 (Exact Date)   SpO2 100%   BMI 27.63 kg/m                 Constitutional: Pt appears in NAD               HENT: Head: NCAT.                Right Ear: External ear normal.                 Left Ear: External ear normal.                Eyes: . Pupils are equal, round, and reactive to light. Conjunctivae and  EOM are normal               Nose: without d/c or deformity               Neck: Neck supple. Gross normal ROM               Cardiovascular: Normal rate and regular rhythm.                 Pulmonary/Chest: Effort normal and breath sounds without rales or wheezing.                Abd:  Soft, NT, ND, + BS, no organomegaly               Neurological: Pt is alert. At baseline orientation, motor grossly intact               Skin: Skin is warm. No rashes, no other new lesions, LE edema - none               Psychiatric: Pt behavior is normal without agitation   Micro: none  Cardiac tracings I have personally interpreted today:  none  Pertinent Radiological findings (summarize): none   Lab Results  Component Value Date   WBC 3.3 (L) 12/29/2022   HGB 12.4 12/29/2022   HCT 38.8 12/29/2022   PLT 272 12/29/2022   GLUCOSE 89 12/29/2022   CHOL 191 04/22/2022   TRIG 92.0 04/22/2022   HDL 66.70 04/22/2022   LDLDIRECT 121.7 01/04/2013   LDLCALC 106 (H) 04/22/2022   ALT 33 12/29/2022   AST 25 12/29/2022   NA 137 12/29/2022   K 3.9 12/29/2022   CL 102 12/29/2022   CREATININE 0.69 12/29/2022   BUN 13 12/29/2022   CO2 30 12/29/2022   TSH 2.82 04/22/2022   HGBA1C 5.1 04/22/2022   Assessment/Plan:  Ruth Schmidt is a 53 y.o. Black or African American [2] female with  has a past medical history of Abnormal Pap smear of cervix, Anemia, Hypertension, and Lumbar herniated disc.  Encounter for well adult exam with abnormal findings Age and sex appropriate education and counseling updated with regular exercise and diet Referrals for preventative services - none needed Immunizations addressed - declines covid booster and flu shot, for shingrix at pharmacy Smoking counseling  -  none needed Evidence for depression or other mood disorder - none significant Most recent labs reviewed. I have personally reviewed and have noted: 1) the patient's medical and social history 2) The patient's current  medications and supplements 3) The patient's height, weight, and BMI have been recorded in the chart   Anemia, iron  deficiency For f/u cbc and iron  lab today, cont iron  supplement  HTN (hypertension) BP Readings from Last 3 Encounters:  04/25/23 134/82  04/22/22 128/74  01/20/22 136/80   Borderline elevated, pt to continue medical treatment norvasc  5 every day, hct 25 every day as declines change today   Vitamin D  deficiency Last vitamin D  Lab Results  Component Value Date   VD25OH 37.37 04/22/2022   Low, to start oral replacement '  Followup: Return in about 1 year (around 04/24/2024).  Lynwood Rush, MD 04/25/2023 12:14 PM [facility] Internal Medicine

## 2023-04-25 NOTE — Assessment & Plan Note (Signed)
BP Readings from Last 3 Encounters:  04/25/23 134/82  04/22/22 128/74  01/20/22 136/80   Borderline elevated, pt to continue medical treatment norvasc 5 every day, hct 25 every day as declines change today

## 2023-04-25 NOTE — Assessment & Plan Note (Signed)
Last vitamin D Lab Results  Component Value Date   VD25OH 37.37 04/22/2022   Low, to start oral replacement

## 2023-04-30 ENCOUNTER — Emergency Department (HOSPITAL_COMMUNITY): Payer: No Typology Code available for payment source

## 2023-04-30 ENCOUNTER — Other Ambulatory Visit: Payer: Self-pay

## 2023-04-30 ENCOUNTER — Emergency Department (HOSPITAL_COMMUNITY)
Admission: EM | Admit: 2023-04-30 | Discharge: 2023-05-01 | Disposition: A | Discharge status: Home / Self Care | Payer: No Typology Code available for payment source | Attending: Emergency Medicine | Admitting: Emergency Medicine

## 2023-04-30 DIAGNOSIS — R0789 Other chest pain: Secondary | ICD-10-CM | POA: Insufficient documentation

## 2023-04-30 DIAGNOSIS — S12391A Other nondisplaced fracture of fourth cervical vertebra, initial encounter for closed fracture: Secondary | ICD-10-CM

## 2023-04-30 DIAGNOSIS — R1010 Upper abdominal pain, unspecified: Secondary | ICD-10-CM | POA: Diagnosis present

## 2023-04-30 DIAGNOSIS — R519 Headache, unspecified: Secondary | ICD-10-CM | POA: Insufficient documentation

## 2023-04-30 DIAGNOSIS — Z79899 Other long term (current) drug therapy: Secondary | ICD-10-CM | POA: Insufficient documentation

## 2023-04-30 DIAGNOSIS — I10 Essential (primary) hypertension: Secondary | ICD-10-CM | POA: Diagnosis not present

## 2023-04-30 DIAGNOSIS — Y92410 Unspecified street and highway as the place of occurrence of the external cause: Secondary | ICD-10-CM | POA: Diagnosis not present

## 2023-04-30 LAB — I-STAT CHEM 8, ED
BUN: 15 mg/dL (ref 6–20)
Calcium, Ion: 1.3 mmol/L (ref 1.15–1.40)
Chloride: 104 mmol/L (ref 98–111)
Creatinine, Ser: 0.9 mg/dL (ref 0.44–1.00)
Glucose, Bld: 108 mg/dL — ABNORMAL HIGH (ref 70–99)
HCT: 41 % (ref 36.0–46.0)
Hemoglobin: 13.9 g/dL (ref 12.0–15.0)
Potassium: 3.8 mmol/L (ref 3.5–5.1)
Sodium: 140 mmol/L (ref 135–145)
TCO2: 28 mmol/L (ref 22–32)

## 2023-04-30 MED ORDER — METHOCARBAMOL 500 MG PO TABS: 500.0000 mg | ORAL_TABLET | Freq: Two times a day (BID) | ORAL | 0 refills | Status: AC

## 2023-04-30 MED ORDER — KETOROLAC TROMETHAMINE 15 MG/ML IJ SOLN
15.0000 mg | Freq: Once | INTRAMUSCULAR | Status: AC
  Administered 2023-04-30: 15 mg via INTRAVENOUS
  Filled 2023-04-30: qty 1

## 2023-04-30 MED ORDER — ETODOLAC 400 MG PO TABS: 400.0000 mg | ORAL_TABLET | Freq: Two times a day (BID) | ORAL | 0 refills | Status: AC

## 2023-04-30 MED ORDER — LIDOCAINE 5 % EX PTCH: 1.0000 | MEDICATED_PATCH | CUTANEOUS | 0 refills | Status: AC

## 2023-04-30 MED ORDER — IOHEXOL 300 MG/ML  SOLN
100.0000 mL | Freq: Once | INTRAMUSCULAR | Status: AC | PRN
  Administered 2023-04-30: 100 mL via INTRAVENOUS
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
## 2023-04-30 NOTE — ED Notes (Signed)
Patient transported to X-ray via wheelchair 
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
## 2023-04-30 NOTE — Discharge Instructions (Addendum)
 CT today did show small fracture on the lower portion of your C4 vertebra. We do recommend you to wear cervical collar and follow-up closely with neurosurgery-- call in the morning for appt. Can take prescribed medications as needed for pain. Return here for any new/acute changes.

## 2023-05-01 MED ORDER — HYDROCODONE-ACETAMINOPHEN 5-325 MG PO TABS: 1.0000 | ORAL_TABLET | ORAL | 0 refills | Status: AC | PRN

## 2023-05-01 MED ORDER — OXYCODONE-ACETAMINOPHEN 5-325 MG PO TABS
1.0000 | ORAL_TABLET | Freq: Once | ORAL | Status: AC
Start: 2023-05-01 — End: 2023-05-01
  Administered 2023-05-01: 1 via ORAL
  Filled 2023-05-01: qty 1

## 2023-05-01 MED ORDER — METHOCARBAMOL 500 MG PO TABS: 500.0000 mg | ORAL_TABLET | Freq: Two times a day (BID) | ORAL | 0 refills | Status: AC
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
## 2023-06-08 MED ORDER — INDAPAMIDE 1.25 MG PO TABS
1.25 | ORAL_TABLET | Freq: Every day | ORAL | 0 refills | 90.00 days | Status: DC
Start: 2023-06-08 — End: 2023-08-31

## 2023-06-08 MED ORDER — AMLODIPINE BESYLATE 5 MG PO TABS
5 MG | ORAL_TABLET | Freq: Every day | ORAL | 0 refills | Status: DC
Start: 2023-06-08 — End: 2023-08-31

## 2023-07-25 NOTE — Progress Notes (Signed)
 Patient's HM shows they are overdue for Colorectal Screening.   Care Everywhere and Media Manager files searched.  No results to attach to order nor HM updated.

## 2023-08-24 ENCOUNTER — Other Ambulatory Visit: Payer: Self-pay | Admitting: Internal Medicine

## 2023-08-24 DIAGNOSIS — Z1231 Encounter for screening mammogram for malignant neoplasm of breast: Secondary | ICD-10-CM

## 2023-08-31 ENCOUNTER — Ambulatory Visit
Admit: 2023-08-31 | Discharge: 2023-08-31 | Payer: Medicaid (Managed Care) | Attending: Adult Health | Primary: Adult Health

## 2023-08-31 ENCOUNTER — Encounter: Payer: Medicaid (Managed Care) | Attending: Adult Health | Primary: Adult Health

## 2023-08-31 VITALS — BP 131/84 | HR 67 | Temp 98.60000°F | Resp 16 | Ht 67.0 in | Wt 194.4 lb

## 2023-08-31 DIAGNOSIS — I10 Essential (primary) hypertension: Secondary | ICD-10-CM

## 2023-08-31 DIAGNOSIS — D649 Anemia, unspecified: Secondary | ICD-10-CM

## 2023-08-31 MED ORDER — MELATONIN 10 MG PO CAPS
10 | ORAL_CAPSULE | Freq: Every evening | ORAL | 1 refills | 30.00000 days | Status: DC
Start: 2023-08-31 — End: 2024-02-29

## 2023-08-31 MED ORDER — INDAPAMIDE 1.25 MG PO TABS
1.25 | ORAL_TABLET | Freq: Every day | ORAL | 1 refills | 90.00000 days | Status: DC
Start: 2023-08-31 — End: 2023-10-26

## 2023-08-31 MED ORDER — FERROUS SULFATE 325 (65 FE) MG PO TABS
325 | ORAL_TABLET | ORAL | 0 refills | 30.00000 days | Status: DC
Start: 2023-08-31 — End: 2023-10-26

## 2023-08-31 MED ORDER — AMLODIPINE BESYLATE 5 MG PO TABS
5 | ORAL_TABLET | Freq: Every day | ORAL | 1 refills | 90.00000 days | Status: DC
Start: 2023-08-31 — End: 2023-10-26

## 2023-08-31 NOTE — Progress Notes (Signed)
 Michelle Benitez (DOB:  December 13, 1970) is a 53 y.o. female,Established patient, here for evaluation of the following chief complaint(s):         1. Anemia, unspecified type  Comments:  stable  Labs pending  Iron ordered  Orders:  -     Iron and TIBC; Future  2. Primary hypertension  Comments:  Stable  At goal  Lab work pending  Continue taking medications as prescribed  Orders:  -     CBC with Auto Differential; Future  -     Comprehensive Metabolic Panel; Future  -     amLODIPine  (NORVASC ) 5 MG tablet; Take 1 tablet by mouth daily high blood pressure, Disp-90 tablet, R-1Normal  -     indapamide  (LOZOL ) 1.25 MG tablet; Take 1 tablet by mouth daily high blood pressure, Disp-90 tablet, R-1Normal  3. Anxiety  Comments:  Controlled  Resolved  Stop Pristiq   Continue to monitor follow-up appointments  4. Sleep disturbances  Comments:  Uncontrolled  > 3 months  difficult time staying asleep falls asleep for about 5 minutes & then is up until 4 AM  Sleep study  Melatonin 10 mg HS  Orders:  -     melatonin 10 MG CAPS capsule; Take 1 capsule by mouth nightly, Disp-30 capsule, R-1Normal  -     [facility] - Louellen Route, CNP, Sleep Medicine, [place]  5. Snoring  Comments:  Uncontrolled  Sleep study ordered  Apnea could be leading to the sleep difficulties  Orders:  -     [facility] - Louellen Route, CNP, Sleep Medicine, [place]       Return in about 6 months (around 03/01/2024), or HTN, sleep, for medication f/u, MD visit, Labs.       Subjective     HPI    Weight-gain  Had Leep 8 years ago and hasn't had a period  Suspect r/t menopause  Followed with a nutritionist in the past  Has an OBGYN-may benefit from a visit with them to go over her hormone levels suspect that this could be related to peri/post menopause    Sleeping difficulty  Could be r/t menopause  And encouraged to try melatonin 10 mg at night  If not successful as sleep deprivation can drive anxiety and weight gain encouraged patient to reach back out to me and  we would go over some other sleep medication options  Additionally patient Dors is that she snores a lot at night she will wake herself up.  She has not had a sleep study I think she could benefit from that as well    Hypertension  Controlled and at goal  Labs today    Dizziness noted when she looks down patient has been anemic is not taking her iron at this time would like to get panels on her as the dizziness can be contributed to those levels.    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 Repeat Pap in 1 year    Hypertension     Menorrhagia with irregular cycle 01/20/2014    Migraine     None in years, 3y, since have a piercing in upper ear after someone suggested this could help    Vitamin D deficiency 10/14/2013       Past Surgical History:   Procedure Laterality Date    CESAREAN SECTION  1995, 2012    x2    DILATION AND CURETTAGE OF UTERUS  04/14/14    LEEP  02/2013    with colposcopy    OTHER SURGICAL HISTORY Right 2007, 02/2013    Eye surgery, ptyergium removal    TUBAL LIGATION  01/2011       Current Outpatient Medications   Medication Sig Dispense Refill    solifenacin (VESICARE) 5 MG tablet Take 1 tablet by mouth Every Day      [START ON 09/01/2023] ferrous sulfate  (IRON 325) 325 (65 Fe) MG tablet Take 1 tablet by mouth three times a week for 40 doses Take 1 tablet by mouth 3 times per week (M, W, F). 40 tablet 0     amLODIPine  (NORVASC ) 5 MG tablet Take 1 tablet by mouth daily high blood pressure 90 tablet 1    indapamide  (LOZOL ) 1.25 MG tablet Take 1 tablet by mouth daily high blood pressure 90 tablet 1    melatonin 10 MG CAPS capsule Take 1 capsule by mouth nightly 30 capsule 1    vitamin D (CHOLECALCIFEROL ) 25 MCG (1000 UT) TABS tablet Take 2 tablets by mouth daily 90 tablet 1    valACYclovir (VALTREX) 1 g tablet Take 1 tablet by mouth daily       No current facility-administered medications for this visit.       Allergies   Allergen Reactions    Escitalopram Other (See Comments)     insomnia    Influenza Vaccines Swelling     Happened 2 years in a row.    Lisinopril Angioedema     Lip swelling on 07/10/13 while taking lisinopril-HCTZ       Social History     Socioeconomic History    Marital status: Single     Spouse name: Not on file    Number of children: Not on file    Years of education: Not on file    Highest education level: Not on file   Occupational History    Not on file   Tobacco Use    Smoking status: Never    Smokeless tobacco: Never   Substance and Sexual Activity    Alcohol use: Yes     Alcohol/week: 0.0 standard drinks of alcohol    Drug use: No    Sexual activity: Not on file     Comment: monogamous with partner since about 2014   Other Topics Concern    Not on file   Social History Narrative    Lives in [place] with two sons (one in school at [place]), one dtr (after [place]), mother  Originally from [place]     Earned EdD at [employer] in 2018 in education.      Social Drivers  of Health     Financial Resource Strain: Low Risk  (10/21/2022)    Overall Financial Resource Strain (CARDIA)     Difficulty of Paying Living Expenses: Not hard at all   Food Insecurity: No Food Insecurity (08/31/2023)    Hunger Vital Sign     Worried About Running Out of Food in the Last Year: Never true     Ran Out of Food in the Last Year: Never true   Transportation Needs: No Transportation Needs (08/31/2023)    PRAPARE - Tourist information centre manager (Medical): No     Lack of Transportation (Non-Medical): No   Physical Activity: Not on file   Stress: Not on file   Social Connections: Not on file   Intimate Partner Violence: Not on file   Housing Stability: Low Risk  (08/31/2023)    Housing Stability Vital Sign     Unable to Pay for Housing in the Last Year: No     Number of Times Moved in the Last Year: 0     Homeless in the Last Year: No       Review of Systems   Constitutional:  Positive for unexpected weight change.   HENT:  Negative for congestion.    Eyes:  Negative for visual disturbance.   Respiratory:  Negative for shortness of breath.         Snoring at night     Cardiovascular:  Negative for chest pain.   Genitourinary:  Negative for difficulty urinating.   Allergic/Immunologic: Negative for immunocompromised state.   Neurological:  Positive for dizziness.   Psychiatric/Behavioral:  Positive for sleep disturbance.    All other systems reviewed and are negative.         Objective   Physical Exam  Vitals and nursing note reviewed.   Constitutional:       Appearance: Normal appearance.   HENT:      Head: Normocephalic.      Nose: Nose normal.   Eyes:      Extraocular Movements: Extraocular movements intact.      Conjunctiva/sclera: Conjunctivae normal.      Pupils: Pupils are equal, round, and reactive to light.   Cardiovascular:      Rate and Rhythm: Normal rate and regular rhythm.      Heart sounds: Normal heart sounds.   Pulmonary:      Effort: Pulmonary effort is normal.      Breath sounds: Normal breath sounds.   Abdominal:      Tenderness: There is no guarding.   Musculoskeletal:         General: Normal range of motion.      Cervical back: Normal range of motion.   Skin:     General: Skin is warm and dry.   Neurological:      General: No focal deficit present.      Mental Status: She is alert and oriented to person, place, and time. Mental status is at baseline.   Psychiatric:         Mood and Affect: Mood normal.         Behavior:  Behavior normal.         Thought Content: Thought content normal.         Judgment: Judgment normal.                  No results found for any visits on 08/31/23.    Return in about 6  months (around 03/01/2024), or HTN, sleep, for medication f/u, MD visit, Labs.     I have reviewed the patient's medical history in detail and updated the computerized patient record.     We had a prolonged discussion about these complex clinical issues and went over the various important aspects to consider. All questions were answered.     She was given an after visit summary or informed of MyChart Access which includes patient instructions, diagnoses, current medications, & vitals.  She expressed understanding with the diagnosis and plan.     This note was created using voice recognition software. Despite editing, there may be syntax errors.     An electronic signature was used to authenticate this note.    Million Maharaj S Toni Hoffmeister, APRN - CNP

## 2023-08-31 NOTE — Progress Notes (Signed)
 Chief Complaint   Patient presents with    Annual Exam         Health Maintenance Due   Topic Date Due    Hepatitis B vaccine (1 of 3 - 19+ 3-dose series) Never done    Colorectal Cancer Screen  Never done    Pneumococcal 50+ years Vaccine (1 of 1 - PCV) Never done    Shingles vaccine (2 of 2) 01/06/2022    COVID-19 Vaccine (1 - 2024-25 season) Never done    Breast cancer screen  07/28/2023         Have you been to the ER, urgent care clinic since your last visit?  Hospitalized since your last visit?    NO    "Have you seen or consulted any other health care providers outside of [facility] since your last visit?"    GYN    "Have you had a colorectal cancer screening such as a colonoscopy/FIT/Cologuard?    NO    No colonoscopy on file  No cologuard on file  No FIT/FOBT on file   No flexible sigmoidoscopy on file        Have you had a mammogram?"   NO    Date of last Mammogram: 07/27/2021

## 2023-09-02 LAB — CBC WITH AUTO DIFFERENTIAL
Basophils %: 0.7 % (ref 0.0–1.0)
Basophils Absolute: 0.02 10*3/uL (ref 0.00–0.10)
Eosinophils %: 1 % (ref 0.0–7.0)
Eosinophils Absolute: 0.03 10*3/uL (ref 0.00–0.40)
Hematocrit: 34.4 % — ABNORMAL LOW (ref 35.0–47.0)
Hemoglobin: 11.4 g/dL — ABNORMAL LOW (ref 11.5–16.0)
Immature Granulocytes %: 0.3 % (ref 0.0–0.5)
Immature Granulocytes Absolute: 0.01 10*3/uL (ref 0.00–0.04)
Lymphocytes %: 34.5 % (ref 12.0–49.0)
Lymphocytes Absolute: 1 10*3/uL (ref 0.80–3.50)
MCH: 30.6 pg (ref 26.0–34.0)
MCHC: 33.1 g/dL (ref 30.0–36.5)
MCV: 92.2 FL (ref 80.0–99.0)
MPV: 11.5 FL (ref 8.9–12.9)
Monocytes %: 11 % (ref 5.0–13.0)
Monocytes Absolute: 0.32 10*3/uL (ref 0.00–1.00)
Neutrophils %: 52.5 % (ref 32.0–75.0)
Neutrophils Absolute: 1.52 10*3/uL — ABNORMAL LOW (ref 1.80–8.00)
Nucleated RBCs: 0 /100{WBCs}
Platelets: 304 10*3/uL (ref 150–400)
RBC: 3.73 M/uL — ABNORMAL LOW (ref 3.80–5.20)
RDW: 14.3 % (ref 11.5–14.5)
WBC: 2.9 10*3/uL — ABNORMAL LOW (ref 3.6–11.0)
nRBC: 0 10*3/uL (ref 0.00–0.01)

## 2023-09-02 LAB — IRON AND TIBC
Iron % Saturation: 17 % — ABNORMAL LOW (ref 20–50)
Iron: 64 ug/dL (ref 35–150)
TIBC: 382 ug/dL (ref 250–450)

## 2023-09-02 LAB — COMPREHENSIVE METABOLIC PANEL
ALT: 20 U/L (ref 12–78)
AST: 18 U/L (ref 15–37)
Albumin/Globulin Ratio: 1.1 (ref 1.1–2.2)
Albumin: 4.1 g/dL (ref 3.5–5.0)
Alk Phosphatase: 77 U/L (ref 45–117)
Anion Gap: 7 mmol/L (ref 2–12)
BUN/Creatinine Ratio: 11 — ABNORMAL LOW (ref 12–20)
BUN: 11 mg/dL (ref 6–20)
CO2: 27 mmol/L (ref 21–32)
Calcium: 9.7 mg/dL (ref 8.5–10.1)
Chloride: 105 mmol/L (ref 97–108)
Creatinine: 1.03 mg/dL — ABNORMAL HIGH (ref 0.55–1.02)
Est, Glom Filt Rate: 65 mL/min/{1.73_m2} (ref >60)
Globulin: 3.9 g/dL (ref 2.0–4.0)
Glucose: 102 mg/dL — ABNORMAL HIGH (ref 65–100)
Potassium: 3.3 mmol/L — ABNORMAL LOW (ref 3.5–5.1)
Sodium: 139 mmol/L (ref 136–145)
Total Bilirubin: 0.6 mg/dL (ref 0.2–1.0)
Total Protein: 8 g/dL (ref 6.4–8.2)

## 2023-09-05 NOTE — Telephone Encounter (Signed)
 Iron supplement sent.   Needs to increase dietary potassium intake.

## 2023-09-26 ENCOUNTER — Ambulatory Visit: Payer: Medicaid (Managed Care) | Attending: Adult Health | Primary: Adult Health

## 2023-09-26 ENCOUNTER — Ambulatory Visit: Admit: 2023-09-26 | Discharge: 2023-09-26 | Payer: Medicaid (Managed Care) | Primary: Adult Health

## 2023-09-26 VITALS — BP 152/109 | HR 79 | Temp 98.30000°F | Resp 16 | Wt 200.0 lb

## 2023-09-26 DIAGNOSIS — M25531 Pain in right wrist: Principal | ICD-10-CM

## 2023-09-26 MED ORDER — PREDNISONE 20 MG PO TABS
20 | ORAL_TABLET | ORAL | 0 refills | Status: AC
Start: 2023-09-26 — End: 2023-10-05

## 2023-09-26 NOTE — Progress Notes (Signed)
 Subjective     Chief Complaint   Patient presents with   . Hand Pain     Right hand pain started in thumb and radiating up to elbow  X sun  No known injuries       Denies injury      Hand Pain   The incident occurred 3 to 5 days ago. There was no injury mechanism. The pain is present in the right wrist and right hand. The quality of the pain is described as aching and burning. The pain radiates to the right arm. The pain is moderate. The pain has been Fluctuating since the incident. The symptoms are aggravated by movement. The treatment provided no relief.     Past Medical History:   Diagnosis Date   . Anemia     Last CBC: h/h 10.1/29.8 (10/2008)   . Genital herpes    . History of loop electrical excision procedure (LEEP)    . 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 Repeat Pap in 1 year   . Hypertension    . Menorrhagia with irregular cycle 01/20/2014   . Migraine     None in years, 3y, since have a piercing in upper ear after someone suggested this could help   . Vitamin D deficiency 10/14/2013       Past Surgical History:   Procedure Laterality Date   . CESAREAN SECTION  1995, 2012    x2   . DILATION AND CURETTAGE OF UTERUS  04/14/14   . LEEP  02/2013    with colposcopy   . OTHER SURGICAL HISTORY Right 2007, 02/2013    Eye surgery, ptyergium removal   . TUBAL LIGATION  01/2011       Family History   Problem Relation Age of Onset   . No Known Problems Brother    . No Known Problems Daughter    . Diabetes Paternal Grandmother    . No Known Problems Maternal Grandfather    . No Known Problems Brother    . Hypertension Sister    . Other Father         Just had legs amputated due to blood clots and gangrene, no DM   . Cancer Maternal Aunt         cervical?   . Cancer Maternal Grandmother         cervical?   . Hypertension Sister    . No Known Problems Sister    . No Known Problems Son    . No Known Problems Son    . Hypertension Mother    . Breast Cancer Neg Hx    .  No Known Problems Paternal Grandfather        Allergies   Allergen Reactions   . Escitalopram Other (See Comments)     insomnia   . Influenza Vaccines Swelling     Happened 2 years in a row.   . Lisinopril Angioedema     Lip swelling on 07/10/13 while taking lisinopril-HCTZ       Social History     Tobacco Use   . Smoking status: Never   . Smokeless tobacco: Never   Substance Use Topics   . Alcohol use: Not Currently   . Drug use: No       Vitals:    09/26/23 1101   BP: (!) 152/109   Pulse:    Resp:    Temp:  SpO2:        Objective     Physical Exam  Vitals and nursing note reviewed.   Constitutional:       General: She is not in acute distress.     Appearance: Normal appearance. She is not ill-appearing, toxic-appearing or diaphoretic.   Musculoskeletal:         General: Swelling (Mild R wrist) and tenderness (R wrist and hand) present. No deformity or signs of injury.   Skin:     General: Skin is warm and dry.      Findings: No bruising, erythema or rash.   Neurological:      Mental Status: She is alert.      Comments: Grip strength intact.     Assessment & Plan     Diagnoses and all orders for this visit:  Right wrist pain  -     predniSONE  (DELTASONE ) 20 MG tablet; Take 3 tabs PO qday for first three days, 2 tabs PO qday for day 4-6, and 1 tab PO qday for day 7-9    No hx of injury, x-ray unlikely to be of help at this time. Shared decision making with patient regarding x-ray, she did not opt for x-ray. Likely over use injury. Slight chance of radiculopathy.    Prednisone  sent to pharmacy.  ACE wrap applied. RICE. Advised f/u with PCP for recheck and for reevaluation of BP. Follow up with UC as needed. ER if acute worsening.      I have discussed the results, diagnosis and treatment plan with the patient.  The patient also understands that early in the process of an illness, an urgent care workup can be falsely reassuring.  Routine discharge counseling and specific return precautions discussed with patient and  the patient understands that worsening, changing or persistent symptoms should prompt an immediate return to the urgent care or emergency department.  Patient/Guardian expressed understanding and agrees with the discharge plan.  No further questions at time of discharge.    Michelle Benitez, [place]

## 2023-10-26 ENCOUNTER — Ambulatory Visit
Admit: 2023-10-26 | Discharge: 2023-10-26 | Payer: Medicaid (Managed Care) | Attending: Adult Health | Primary: Adult Health

## 2023-10-26 DIAGNOSIS — I10 Essential (primary) hypertension: Principal | ICD-10-CM

## 2023-10-26 MED ORDER — FERROUS SULFATE 325 (65 FE) MG PO TABS
325 | ORAL_TABLET | Freq: Every day | ORAL | 0 refills | 30.00000 days | Status: DC
Start: 2023-10-26 — End: 2023-10-27

## 2023-10-26 MED ORDER — ACETAMINOPHEN 500 MG PO TABS
500 | ORAL_TABLET | Freq: Three times a day (TID) | ORAL | 0 refills | 10.00000 days | Status: DC | PRN
Start: 2023-10-26 — End: 2024-03-13

## 2023-10-26 MED ORDER — HYDROCHLOROTHIAZIDE 25 MG PO TABS
25 | ORAL_TABLET | Freq: Every morning | ORAL | 0 refills | 90.00000 days | Status: DC
Start: 2023-10-26 — End: 2023-12-01
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
## 2023-10-26 NOTE — Patient Instructions (Addendum)
 Beginning October 30, 2023 I will be practicing at both [facility] and [facility].  You can schedule with me at either clinic, whichever 1 is most convenient for you.  And I will still be excepting virtual/telemedicine appointments.    I will be available on Mondays and Wednesdays from 7:30 in the morning until 5 PM at [facility].    [facility] located @ [facility]  [address]  [address]  [place], [place]  [address]  Tel: [phone]  Fax: [phone]    I will be available on Thursday and Friday from 0730 AM - 5 PM at     [facility]  [address], [address]  [place], [place] [address]  [phone]    Please go to MyChart and use the self-scheduling option to make an appointment that suits you best.  If you have difficulty scheduling your appointment online please do not hesitate to reach out to me via MyChart or you can call the clinic of your preference for your appointment at one of the telephone numbers listed above.    Thank you for allowing me to serve your healthcare needs.            Also, take.    You can take 1000 mg of Tylenol  every 6-8 hours as needed for your aches and pains.  Please do not take more than 4000 mg of Tylenol  in a 24-hour period as this can have serious consequences on your liver.  You can pick up Tylenol  over-the-counter and 500 mg that is the extra strength.

## 2023-10-26 NOTE — Progress Notes (Unsigned)
 Health Maintenance Due   Topic Date Due    Hepatitis B vaccine (1 of 3 - 19+ 3-dose series) Never done    Diabetes screen  Never done    Colorectal Cancer Screen  Never done    Pneumococcal 50+ years Vaccine (1 of 1 - PCV) Never done    Shingles vaccine (2 of 2) 01/06/2022    COVID-19 Vaccine (1 - 2024-25 season) Never done    Breast cancer screen  07/28/2023         Have you been to the ER, urgent care clinic since your last visit?  Hospitalized since your last visit?    Urgent care 07-08    "Have you seen or consulted any other health care providers outside of [facility] since your last visit?"    NO    "Have you had a colorectal cancer screening such as a colonoscopy/FIT/Cologuard?    NO    No colonoscopy on file  No cologuard on file  No FIT/FOBT on file   No flexible sigmoidoscopy on file        Have you had a mammogram?"   NO    Date of last Mammogram: 07/27/2021

## 2023-10-27 ENCOUNTER — Encounter

## 2023-10-27 LAB — COMPREHENSIVE METABOLIC PANEL
ALT: 21 U/L (ref 10–35)
AST: 25 U/L (ref 10–35)
Albumin/Globulin Ratio: 1.2 (ref 1.1–2.2)
Albumin: 3.9 g/dL (ref 3.5–5.2)
Alk Phosphatase: 66 U/L (ref 35–104)
Anion Gap: 12 mmol/L (ref 2–14)
BUN/Creatinine Ratio: 12 (ref 12–20)
BUN: 11 mg/dL (ref 6–20)
CO2: 25 mmol/L (ref 20–29)
Calcium: 9.9 mg/dL (ref 8.6–10.0)
Chloride: 105 mmol/L (ref 98–107)
Creatinine: 0.9 mg/dL (ref 0.60–1.00)
Est, Glom Filt Rate: 77 ml/min/1.73m2 — ABNORMAL LOW (ref >90)
Globulin: 3.3 g/dL (ref 2.0–4.0)
Glucose: 86 mg/dL (ref 65–100)
Potassium: 4.1 mmol/L (ref 3.5–5.1)
Sodium: 142 mmol/L (ref 136–145)
Total Bilirubin: 0.4 mg/dL (ref 0.0–1.2)
Total Protein: 7.2 g/dL (ref 6.4–8.3)

## 2023-10-27 MED ORDER — FERROUS SULFATE 325 (65 FE) MG PO TABS
325 | ORAL_TABLET | Freq: Every day | ORAL | 0 refills | 30.00000 days | Status: DC
Start: 2023-10-27 — End: 2024-02-29

## 2023-11-02 ENCOUNTER — Telehealth
Admit: 2023-11-02 | Discharge: 2023-11-02 | Payer: Medicaid (Managed Care) | Attending: Adult Health | Primary: Adult Health

## 2023-11-02 ENCOUNTER — Inpatient Hospital Stay: Admit: 2023-11-02 | Payer: Medicaid (Managed Care) | Primary: Adult Health

## 2023-11-02 DIAGNOSIS — M25561 Pain in right knee: Secondary | ICD-10-CM

## 2023-11-02 MED ORDER — LOSARTAN POTASSIUM 50 MG PO TABS
50 | ORAL_TABLET | Freq: Every day | ORAL | 0 refills | 90.00000 days | Status: DC
Start: 2023-11-02 — End: 2024-02-29

## 2023-11-02 MED ORDER — PREDNISONE 20 MG PO TABS
20 | ORAL_TABLET | Freq: Two times a day (BID) | ORAL | 0 refills | 6.50000 days | Status: AC
Start: 2023-11-02 — End: 2023-11-07

## 2023-11-02 NOTE — Progress Notes (Signed)
 Chief Complaint   Patient presents with    Follow-up     Knee pain.             Health Maintenance Due   Topic Date Due    Hepatitis B vaccine (1 of 3 - 19+ 3-dose series) Never done    Colorectal Cancer Screen  Never done    Pneumococcal 50+ years Vaccine (1 of 1 - PCV) Never done    Shingles vaccine (2 of 2) 01/06/2022    COVID-19 Vaccine (1 - 2024-25 season) Never done    Breast cancer screen  07/28/2023         Have you been to the ER, urgent care clinic since your last visit?  Hospitalized since your last visit?    NO    "Have you seen or consulted any other health care providers outside of [facility] since your last visit?"    NO    "Have you had a colorectal cancer screening such as a colonoscopy/FIT/Cologuard?    NO    No colonoscopy on file  No cologuard on file  No FIT/FOBT on file   No flexible sigmoidoscopy on file        Have you had a mammogram?"   NO    Date of last Mammogram: 07/27/2021

## 2023-11-02 NOTE — Progress Notes (Signed)
 Michelle Benitez, was evaluated through a synchronous (real-time) audio-video encounter. The patient (or guardian if applicable) is aware that this is a billable service, which includes applicable co-pays. This Virtual Visit was conducted with patient's (and/or legal guardian's) consent. Patient identification was verified, and a caregiver was present when appropriate.   The patient was located at Home: [address]  [place] [address]  Provider was located at Facility (Appt Dept): [address]  [place],  [place] [address]  Confirm you are appropriately licensed, registered, or certified to deliver care in the state where the patient is located as indicated above. If you are not or unsure, please re-schedule the visit: Yes, I confirm.     Michelle Benitez (DOB:  31-Oct-1970) is a Established patient, presenting virtually for evaluation of the following:      Below is the assessment and plan developed based on review of pertinent history, physical exam, labs, studies, and medications.     Assessment & Plan  Pain and swelling of right knee   Uncontrolled and not improving  Xray below and steroid  Cont conservative tx will f/u after xray and may need f/u with ortho    Orders:    XR KNEE RIGHT (MIN 4 VIEWS); Future    predniSONE  (DELTASONE ) 20 MG tablet; Take 1 tablet by mouth 2 times daily for 5 days    Primary hypertension  Uncontrolled-not at goal  cont HCTZ at 25 mg  add losartan  50 mg  if tolerates can use combo pill     Orders:    losartan  (COZAAR ) 50 MG tablet; Take 1 tablet by mouth daily      Return in about 2 weeks (around 11/16/2023) for BP check, medication f/u.       Subjective   HPI    F/u re: right knee swelling it is not any better. It comes and goes. Ice and elevation helps. Pain improved with tylenol . Pt sent a picture, no improvement. Would like an xray, could be a bakers cyst and steroids.      HTN  Been uncontrolled  On HCTZ at 25 mg daily  Will add 50 mg of losartan   F/u in 2  weeks  150/99 in the morning  133/78 in the evening  140/87 this morning      Review of Systems   Eyes:  Negative for visual disturbance.   Respiratory:  Negative for shortness of breath.    Cardiovascular:  Positive for leg swelling. Negative for chest pain.   Gastrointestinal:  Negative for nausea and vomiting.   Musculoskeletal:  Positive for arthralgias, gait problem and joint swelling.   Skin:  Negative for color change, rash and wound.   Allergic/Immunologic: Negative for immunocompromised state.          Objective   Patient-Reported Vitals  Patient-Reported Systolic (Top): 140 mmHg  Patient-Reported Diastolic (Bottom): 87 mmHg       Physical Exam  Musculoskeletal:      Right knee: Swelling present. No erythema. Decreased range of motion.      Left knee: Normal.       [INSTRUCTIONS:  [x]  Indicates a positive item  []  Indicates a negative item  -- DELETE ALL ITEMS NOT EXAMINED]    Constitutional: [x]  Appears well-developed and well-nourished [x]  No apparent distress      []  Abnormal -     Mental status: [x]  Alert and awake  [x]  Oriented to person/place/time [x]  Able to follow commands    []   Abnormal -     Eyes:   EOM    [x]   Normal    []  Abnormal -   Sclera  [x]   Normal    []  Abnormal -          Discharge [x]   None visible   []  Abnormal -     HENT: [x]  Normocephalic, atraumatic  []  Abnormal -   [x]  Mouth/Throat: Mucous membranes are moist    External Ears [x]  Normal  []  Abnormal -    Neck: [x]  No visualized mass []  Abnormal -     Pulmonary/Chest: [x]  Respiratory effort normal   [x]  No visualized signs of difficulty breathing or respiratory distress        []  Abnormal -      Musculoskeletal:   []  Normal gait with no signs of ataxia         [x]  Normal range of motion of neck        []  Abnormal -     Neurological:        [x]  No Facial Asymmetry (Cranial nerve 7 motor function) (limited exam due to video visit)          [x]  No gaze palsy        []  Abnormal -          Skin:        [x]  No significant  exanthematous lesions or discoloration noted on facial skin         []  Abnormal -            Psychiatric:       [x]  Normal Affect []  Abnormal -        [x]  No Hallucinations    Other pertinent observable physical exam findings:-             --Kate GORMAN Human, APRN - CNP

## 2023-11-02 NOTE — Assessment & Plan Note (Signed)
 Uncontrolled and not improving  Xray below and steroid  Cont conservative tx will f/u after xray and may need f/u with ortho    Orders:    XR KNEE RIGHT (MIN 4 VIEWS); Future    predniSONE  (DELTASONE ) 20 MG tablet; Take 1 tablet by mouth 2 times daily for 5 days

## 2023-11-02 NOTE — Assessment & Plan Note (Signed)
 Uncontrolled-not at goal  cont HCTZ at 25 mg  add losartan  50 mg  if tolerates can use combo pill     Orders:    losartan  (COZAAR ) 50 MG tablet; Take 1 tablet by mouth daily

## 2023-11-03 NOTE — Other (Signed)
 Please call the patient let her know that she does have a joint effusion.  She needs to continue taking the steroids as prescribed.  Treatment is rest and elevation ice and compression pain relievers such as Tylenol  at 1000 mg every 8 hours.  We may need to get some physical therapy and she may need to follow-up with orthopedics for a steroid injection.    Patient can follow-up with me next week at the [facility] [place] [facility] on Monday or Wednesday or here at a lit on Thursday and Friday if she is not better.

## 2023-11-16 ENCOUNTER — Telehealth
Admit: 2023-11-16 | Discharge: 2023-11-16 | Payer: Medicaid (Managed Care) | Attending: Adult Health | Primary: Adult Health

## 2023-11-16 DIAGNOSIS — M25461 Effusion, right knee: Principal | ICD-10-CM

## 2023-11-16 MED ORDER — PREDNISONE 10 MG PO TABS
10 | ORAL_TABLET | ORAL | 0 refills | 5.50000 days | Status: AC
Start: 2023-11-16 — End: 2023-11-24

## 2023-11-16 NOTE — Progress Notes (Signed)
 Chief Complaint   Patient presents with    Follow-up     Knee swelling           Health Maintenance Due   Topic Date Due    Hepatitis B vaccine (1 of 3 - 19+ 3-dose series) Never done    Colorectal Cancer Screen  Never done    Pneumococcal 50+ years Vaccine (1 of 1 - PCV) Never done    Shingles vaccine (2 of 2) 01/06/2022    COVID-19 Vaccine (1 - 2024-25 season) Never done    Breast cancer screen  07/28/2023         Have you been to the ER, urgent care clinic since your last visit?  Hospitalized since your last visit?    NO    "Have you seen or consulted any other health care providers outside of [facility] since your last visit?"    no    "Have you had a colorectal cancer screening such as a colonoscopy/FIT/Cologuard?    NO    No colonoscopy on file  No cologuard on file  No FIT/FOBT on file   No flexible sigmoidoscopy on file        Have you had a mammogram?"   NO    Date of last Mammogram: 07/27/2021

## 2023-11-16 NOTE — Progress Notes (Signed)
 Michelle Benitez, was evaluated through a synchronous (real-time) audio-video encounter. The patient (or guardian if applicable) is aware that this is a billable service, which includes applicable co-pays. This Virtual Visit was conducted with patient's (and/or legal guardian's) consent. Patient identification was verified, and a caregiver was present when appropriate.   The patient was located at Home: [address]  [place] [address]  Provider was located at Facility (Appt Dept): [address]  [place],  [place] [address]  Confirm you are appropriately licensed, registered, or certified to deliver care in the state where the patient is located as indicated above. If you are not or unsure, please re-schedule the visit: Yes, I confirm.     Michelle Benitez (DOB:  Oct 10, 1970) is a Established patient, presenting virtually for evaluation of the following:      Below is the assessment and plan developed based on review of pertinent history, physical exam, labs, studies, and medications.     Assessment & Plan  Effusion of bursa of right knee   Stable with some improvement in swelling  PT  And an other round of steroids  Next step will be ortho    Orders:    External Referral To Physical Therapy      No follow-ups on file.       Subjective   HPI    Swelling went down with steroids. However, she is still having pain and difficulty walking. Swelling improved and sometimes it has improved but other times not.  No heat, not hot to touch. No s/s of infection.  Patient would prefer to hold off on injections, start with PT.     Review of Systems   Musculoskeletal:  Positive for arthralgias, gait problem and joint swelling.   Skin:  Negative for rash and wound.   All other systems reviewed and are negative.         Objective   Patient-Reported Vitals  No data recorded     Physical Exam  [INSTRUCTIONS:  [x]  Indicates a positive item  []  Indicates a negative item  -- DELETE ALL ITEMS NOT  EXAMINED]    Constitutional: [x]  Appears well-developed and well-nourished [x]  No apparent distress      []  Abnormal -     Mental status: [x]  Alert and awake  [x]  Oriented to person/place/time [x]  Able to follow commands    []  Abnormal -     Eyes:   EOM    [x]   Normal    []  Abnormal -   Sclera  [x]   Normal    []  Abnormal -          Discharge [x]   None visible   []  Abnormal -     HENT: [x]  Normocephalic, atraumatic  []  Abnormal -   [x]  Mouth/Throat: Mucous membranes are moist    External Ears [x]  Normal  []  Abnormal -    Neck: [x]  No visualized mass []  Abnormal -     Pulmonary/Chest: [x]  Respiratory effort normal   [x]  No visualized signs of difficulty breathing or respiratory distress        []  Abnormal -      Musculoskeletal:   []  Normal gait with no signs of ataxia         [x]  Normal range of motion of neck        []  Abnormal -     Neurological:        [x]  No Facial Asymmetry (Cranial nerve 7 motor function) (  limited exam due to video visit)          [x]  No gaze palsy        []  Abnormal -          Skin:        [x]  No significant exanthematous lesions or discoloration noted on facial skin         []  Abnormal -            Psychiatric:       [x]  Normal Affect []  Abnormal -        [x]  No Hallucinations    Other pertinent observable physical exam findings:-             --Kate GORMAN Human, APRN - CNP

## 2023-11-23 NOTE — Telephone Encounter (Signed)
 Patient is requesting PT referral be sent to [facility], fax # [phone]. They will need information before she can schedule. CB [phone].

## 2023-11-24 NOTE — Telephone Encounter (Signed)
Referral and notes faxed.

## 2023-12-01 ENCOUNTER — Encounter

## 2023-12-04 MED ORDER — HYDROCHLOROTHIAZIDE 25 MG PO TABS
25 | ORAL_TABLET | Freq: Every morning | ORAL | 0 refills | 90.00000 days | Status: DC
Start: 2023-12-04 — End: 2024-01-10

## 2023-12-19 LAB — HM MAMMOGRAPHY

## 2023-12-26 ENCOUNTER — Other Ambulatory Visit: Payer: Self-pay | Admitting: Internal Medicine

## 2023-12-26 ENCOUNTER — Other Ambulatory Visit: Payer: Self-pay

## 2024-01-08 ENCOUNTER — Encounter

## 2024-01-10 MED ORDER — HYDROCHLOROTHIAZIDE 25 MG PO TABS
25 | ORAL_TABLET | Freq: Every morning | ORAL | 0 refills | 90.00000 days | Status: DC
Start: 2024-01-10 — End: 2024-02-29

## 2024-02-29 ENCOUNTER — Other Ambulatory Visit: Payer: Self-pay | Admitting: Internal Medicine

## 2024-02-29 ENCOUNTER — Ambulatory Visit
Admit: 2024-02-29 | Discharge: 2024-02-29 | Payer: Medicaid (Managed Care) | Attending: Adult Health | Primary: Adult Health

## 2024-02-29 VITALS — BP 147/80 | HR 78 | Temp 97.70000°F | Resp 16 | Ht 67.0 in | Wt 196.4 lb

## 2024-02-29 DIAGNOSIS — Z1231 Encounter for screening mammogram for malignant neoplasm of breast: Secondary | ICD-10-CM

## 2024-02-29 DIAGNOSIS — M25561 Pain in right knee: Secondary | ICD-10-CM

## 2024-02-29 DIAGNOSIS — M25461 Effusion, right knee: Principal | ICD-10-CM

## 2024-02-29 DIAGNOSIS — I10 Essential (primary) hypertension: Secondary | ICD-10-CM

## 2024-02-29 MED ORDER — LOSARTAN POTASSIUM-HCTZ 100-25 MG PO TABS
100-25 | ORAL_TABLET | Freq: Every day | ORAL | 0 refills | Status: AC
Start: 2024-02-29 — End: ?

## 2024-02-29 MED ORDER — SOLIFENACIN SUCCINATE 5 MG PO TABS
5 | ORAL_TABLET | Freq: Every day | ORAL | 0 refills | 90.00000 days | Status: DC
Start: 2024-02-29 — End: 2024-03-27

## 2024-02-29 MED ORDER — FERROUS SULFATE 325 (65 FE) MG PO TABS
325 | ORAL_TABLET | Freq: Every day | ORAL | 0 refills | Status: AC
Start: 2024-02-29 — End: 2024-05-29

## 2024-02-29 MED ORDER — CHOLECALCIFEROL 25 MCG (1000 UT) PO TABS
25 | ORAL_TABLET | Freq: Every day | ORAL | 1 refills | Status: AC
Start: 2024-02-29 — End: ?

## 2024-02-29 NOTE — Progress Notes (Signed)
 "Subjective    Michelle Benitez is a 53 y.o. female who presents today for the following:  Chief Complaint   Patient presents with    3 Month Follow-Up    Hypertension       History of Present Illness  The patient presents for evaluation of right knee pain, hypertension, emotional instability, and medication management.    She reports persistent right knee pain, which has not improved as anticipated. The pain is exacerbated by sitting, kneeling, and squatting. She has not sought orthopedic consultation due to her intention to first complete physical therapy. She occasionally uses a wrap for support when the pain intensifies and notes an audible sound from the knee during movement. Prolonged driving exacerbates her knee pain. Physical therapy provided significant relief, but the condition did not fully resolve. Previously prescribed steroid tablets effectively reduced the swelling.    Her blood pressure is elevated today, which she attributes to a stressful day. She is compliant with her antihypertensive medication and typically records home readings in the range of 130s/80s. She acknowledges a lack of regular exercise.    She has discontinued her iron supplement due to the absence of a prescription.    She experiences urgency with urination and is requesting a refill of her Vesicare  prescription, which was initially prescribed by her gynecologist and later by her previous primary care physician. She finds this medication effective in managing her symptoms.    She has been feeling emotionally unstable, often crying without a clear cause. She has not menstruated for over 13 years following a LEEP procedure but retains her ovaries. She reports excessive hair growth requiring shaving but does not experience vaginal dryness. She has discussed these symptoms with her gynecologist, who did not express concern. She is considering hormone replacement therapy. She also reports difficulty initiating tasks, leading to feelings of  self-disgust and potential depression. She is reluctant to start antidepressant medication and prefers to explore therapy options.    She is requesting a refill of her vitamin D supplement.    She has discontinued melatonin and instead practices meditation before sleep, which she finds beneficial.    Sleep: Practices meditation before sleep    GYNECOLOGICAL HISTORY:  - Last Menstrual Period: Over 13 years ago    PAST SURGICAL HISTORY:  - LEEP procedure        PMH/PSH/Allergies/Social History/medication list and most recent studies reviewed with patient.     reports that she has never smoked. She has never used smokeless tobacco.    reports that she does not currently use alcohol.   Results  Imaging   - X-ray of the knee: Joint effusion, otherwise unremarkable.       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 Repeat Pap in 1 year    Hypertension     Menorrhagia with irregular cycle 01/20/2014    Migraine     None in years, 3y, since have a piercing in upper ear after someone suggested this could help    Vitamin D deficiency 10/14/2013       Allergies   Allergen Reactions    Escitalopram Other (See Comments)     insomnia    Influenza Vaccines Swelling     Happened 2 years in a row.    Lisinopril Angioedema     Lip swelling on 07/10/13 while taking lisinopril-HCTZ        Current Outpatient Medications on File Prior to Visit   Medication Sig Dispense Refill    acetaminophen  (TYLENOL ) 500 MG tablet Take 2  tablets by mouth every 8 hours as needed for Pain (right knee pain) 90 tablet 0    valACYclovir (VALTREX) 1 g tablet Take 1 tablet by mouth daily (Patient not taking: Reported on 02/29/2024)       No current facility-administered medications on file prior to visit.           Review of Systems   Constitutional:  Positive for fatigue.   Respiratory:  Negative for shortness of breath.    Cardiovascular:  Negative for chest pain.   Musculoskeletal:  Positive for arthralgias and gait problem.   Allergic/Immunologic: Negative for immunocompromised state.   Psychiatric/Behavioral:  The patient is nervous/anxious.    All other systems reviewed and are negative.        Objective    Physical Exam  Vitals and nursing note reviewed.   Constitutional:       Appearance: Normal appearance.   HENT:      Head: Normocephalic.      Nose: Nose normal.   Eyes:      Extraocular Movements: Extraocular movements intact.      Conjunctiva/sclera: Conjunctivae normal.      Pupils: Pupils are equal, round, and reactive to light.   Cardiovascular:      Rate and Rhythm: Normal rate and regular rhythm.      Heart sounds: Normal heart sounds.   Pulmonary:      Effort: Pulmonary effort is normal.      Breath sounds: Normal breath sounds.   Abdominal:      Tenderness: There is no guarding.   Musculoskeletal:         General: Normal range of motion.      Cervical back: Normal range of motion.   Skin:     General: Skin is warm and dry.   Neurological:      General: No focal deficit present.      Mental Status: She is alert and oriented to person, place, and time. Mental status is at baseline.   Psychiatric:         Mood and Affect: Mood normal.         Behavior: Behavior normal.         Thought Content: Thought content normal.         Judgment: Judgment normal.        Physical Exam  Musculoskeletal: Right knee with joint effusion, occasional crepitus noted on movement.      Assessment & Plan    Assessment & Plan  1. Right knee pain:  - The knee has  not healed as expected, with difficulty kneeling and squatting.  - The x-ray  results indicate a joint effusion, which may necessitate drainage or steroid intervention.  - A referral to orthopedics will be made for further evaluation and potential treatment recommendations.  - The use of a compression garment for the right knee is suggested.    2. Hypertension:  - Blood pressure has been consistently uncontrolled since 09/2023, placing her at an elevated risk for heart disease.  - The current regimen of losartan  50 mg and hydrochlorothiazide  25 mg will be replaced with Hyzaar , a combination medication.  - A 90-day supply of Hyzaar  will be provided, and she is advised to discontinue the use of losartan  50 mg and hydrochlorothiazide  25 mg.  - A follow-up appointment for a blood pressure check is scheduled for 03/13/2024.    3. Iron deficiency:  - A prescription for iron supplements will be provided.    4. Urinary urgency:  - A prescription for Vesicare  will be provided.    5. Depression:  - She exhibits symptoms suggestive of depression, including emotional instability and lack of motivation.  - A referral to [facility] will be made for further evaluation and potential treatment.  - she doesn't want medication management.    6. Medication management:  - A prescription for vitamin D supplements will be provided.    Follow-up: The patient will follow up on 03/13/2024.    Momina was seen today for 3 month follow-up and hypertension.    Diagnoses and all orders for this visit:    Primary hypertension  Comments:  uncontrolled  increase RX   f/u in 2 weeks  Orders:  -     losartan -hydroCHLOROthiazide  (HYZAAR ) 100-25 MG per tablet; Take 1 tablet by mouth daily    Pain and swelling of right knee  Comments:  uncontrolled  refer to ortho for additional education and evaluation  Orders:  -     [facility] Referral    Iron deficiency anemia, unspecified iron deficiency anemia  type  Comments:  Uncontrolled  needs to take iron as prescribed  Orders:  -     ferrous sulfate  (IRON 325) 325 (65 Fe) MG tablet; Take 1 tablet by mouth daily for 90 doses    Urge incontinence of urine  Comments:  stable  rx below  Orders:  -     solifenacin  (VESICARE ) 5 MG tablet; Take 1 tablet by mouth daily    Vitamin D deficiency  Comments:  not controlled  needs to take RX consistently  Orders:  -     vitamin D (CHOLECALCIFEROL ) 25 MCG (1000 UT) TABS tablet; Take 2 tablets by mouth daily          Return in about 13 days (around 03/13/2024) for BP check, medication f/u, MD visit.     Michelle Ron S Samier Jaco, APRN - CNP      The patient (or guardian, if applicable) and other individuals in attendance with the patient were advised that Artificial Intelligence will be utilized during this visit to record and process the conversation to generate a clinical note. The patient (or guardian, if applicable) and other individuals in attendance at the appointment consented to the use of AI, including the recording.                  "

## 2024-02-29 NOTE — Progress Notes (Signed)
"  Chief Complaint   Patient presents with    3 Month Follow-Up         Health Maintenance Due   Topic Date Due    Hepatitis B vaccine (1 of 3 - 19+ 3-dose series) Never done    Colorectal Cancer Screen  Never done    Pneumococcal 50+ years Vaccine (1 of 1 - PCV) Never done    Shingles vaccine (2 of 2) 01/06/2022    Breast cancer screen  07/28/2023    COVID-19 Vaccine (1 - 2024-25 season) Never done         Have you been to the ER, urgent care clinic since your last visit?  Hospitalized since your last visit?    NO    Have you seen or consulted any other health care providers outside of [facility] since your last visit?    NO    Have you had a colorectal cancer screening such as a colonoscopy/FIT/Cologuard?    NO    No colonoscopy on file  No cologuard on file  No FIT/FOBT on file   No flexible sigmoidoscopy on file        Have you had a mammogram?   NO    Date of last Mammogram: 07/27/2021         "

## 2024-03-01 ENCOUNTER — Ambulatory Visit: Payer: Medicaid (Managed Care) | Attending: Adult Health | Primary: Adult Health

## 2024-03-08 ENCOUNTER — Ambulatory Visit: Admit: 2024-03-08 | Discharge: 2024-03-08 | Payer: Medicaid (Managed Care) | Primary: Adult Health

## 2024-03-08 VITALS — Ht 67.0 in | Wt 196.0 lb

## 2024-03-08 DIAGNOSIS — M2241 Chondromalacia patellae, right knee: Principal | ICD-10-CM

## 2024-03-08 MED ORDER — BUPIVACAINE HCL 0.25 % IJ SOLN
0.25 | Freq: Once | INTRAMUSCULAR | Status: AC
Start: 2024-03-08 — End: 2024-03-08
  Administered 2024-03-08: 20:00:00 7.5 mL via INTRA_ARTICULAR

## 2024-03-08 MED ORDER — METHYLPREDNISOLONE ACETATE 80 MG/ML IJ SUSP
80 | Freq: Once | INTRAMUSCULAR | Status: AC
Start: 2024-03-08 — End: 2024-03-08
  Administered 2024-03-08: 20:00:00 80 mg via INTRA_ARTICULAR
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
## 2024-03-11 ENCOUNTER — Ambulatory Visit: Payer: Medicaid (Managed Care) | Attending: Adult Health | Primary: Adult Health

## 2024-03-13 ENCOUNTER — Ambulatory Visit
Admit: 2024-03-13 | Discharge: 2024-03-13 | Payer: Medicaid (Managed Care) | Attending: Adult Health | Primary: Adult Health

## 2024-03-13 VITALS — BP 138/88 | HR 66 | Temp 98.00000°F | Resp 20 | Ht 67.0 in | Wt 191.0 lb

## 2024-03-13 DIAGNOSIS — I10 Essential (primary) hypertension: Principal | ICD-10-CM

## 2024-03-13 DIAGNOSIS — M25561 Pain in right knee: Secondary | ICD-10-CM

## 2024-03-13 MED ORDER — ACETAMINOPHEN 500 MG PO TABS
500 | ORAL_TABLET | Freq: Three times a day (TID) | ORAL | 0 refills | 10.00000 days | Status: AC | PRN
Start: 2024-03-13 — End: 2024-03-28

## 2024-03-13 NOTE — Progress Notes (Signed)
 "Subjective    Michelle Benitez is a 53 y.o. female who presents today for the following:  No chief complaint on file.      History of Present Illness  The patient presents for evaluation of blood pressure management and knee pain.    She has been adhering to her prescribed medication regimen, which she takes early in the morning. She reports no chest pain or shortness of breath. Blood pressure is monitored at home, typically in the morning before taking medication, and occasionally afterwards. Dietary modifications include reducing salt intake.    A knee injection was received on 03/08/2024, and she is currently experiencing pain, which may be attributed to the needle. Follow-up every 3 months was advised. A knee replacement is not recommended at this time. It was explained that if there was an existing condition exacerbated by impact, it could potentially worsen symptoms. The initial approach is to administer the injection to assess its effectiveness before considering further treatment options.    She is taking vitamin D, iron, and bladder control medication.        PMH/PSH/Allergies/Social History/medication list and most recent studies reviewed with patient.     reports that she has never smoked. She has never used smokeless tobacco.    reports that she does not currently use alcohol.   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 Repeat Pap in 1 year    Hypertension     Menorrhagia with irregular cycle 01/20/2014    Migraine     None in years, 3y, since have a piercing in upper ear after someone suggested this could help    Vitamin D deficiency 10/14/2013       Allergies   Allergen Reactions    Escitalopram Other (See Comments)     insomnia    Influenza Vaccines Swelling      Happened 2 years in a row.    Lisinopril Angioedema     Lip swelling on 07/10/13 while taking lisinopril-HCTZ        Current Outpatient Medications on File Prior to Visit   Medication Sig Dispense Refill    ferrous sulfate  (IRON 325) 325 (65 Fe) MG tablet Take 1 tablet by mouth daily for 90 doses 90 tablet 0    solifenacin  (VESICARE ) 5 MG tablet Take 1 tablet by mouth daily 30 tablet 0    vitamin D (CHOLECALCIFEROL ) 25 MCG (1000 UT) TABS tablet Take 2 tablets by mouth daily 90 tablet 1    losartan -hydroCHLOROthiazide  (HYZAAR ) 100-25 MG per tablet Take 1 tablet by mouth daily 90 tablet 0    acetaminophen  (TYLENOL ) 500 MG tablet Take 2 tablets by mouth every 8 hours as needed for Pain (right knee pain) 90 tablet 0    valACYclovir (VALTREX) 1 g tablet Take 1 tablet by mouth daily (Patient not taking: Reported on 02/29/2024)       No current facility-administered medications on file prior to visit.           Review of Systems   Respiratory:  Negative for shortness of breath.    Cardiovascular:  Negative for chest pain.   Gastrointestinal:  Negative for nausea and  vomiting.   Musculoskeletal:  Positive for arthralgias. Negative for gait problem.   Allergic/Immunologic: Negative for immunocompromised state.   Neurological:  Negative for headaches.   All other systems reviewed and are negative.        Objective    Physical Exam  Vitals and nursing note reviewed.   Constitutional:       Appearance: Normal appearance.   HENT:      Head: Normocephalic.      Nose: Nose normal.   Eyes:      Extraocular Movements: Extraocular movements intact.      Conjunctiva/sclera: Conjunctivae normal.      Pupils: Pupils are equal, round, and reactive to light.   Cardiovascular:      Rate and Rhythm: Normal rate.   Pulmonary:      Effort: Pulmonary effort is normal.   Abdominal:      Tenderness: There is no guarding.   Musculoskeletal:         General: Normal range of motion.      Cervical back: Normal range of motion.   Skin:      General: Skin is warm and dry.   Neurological:      General: No focal deficit present.      Mental Status: She is alert and oriented to person, place, and time. Mental status is at baseline.   Psychiatric:         Mood and Affect: Mood normal.         Behavior: Behavior normal.         Thought Content: Thought content normal.         Judgment: Judgment normal.        Physical Exam        Assessment & Plan    Assessment & Plan  1. Blood pressure management:  - Blood pressure readings have shown improvement.  - The target blood pressure range is set between 130/80 and 139/89.  - She should monitor her blood pressure twice daily, once before and once after taking her medication.  - She is advised to continue her current antihypertensive medication regimen and maintain a low-sodium diet.    2. Knee pain:  - She received a knee injection on Friday and is experiencing some pain, which may be due to the injection.  - She is advised to give it time to see if the pain subsides. If the pain persists after 3 months, she should follow up for further evaluation.    Follow-up: Follow up in 6 months or sooner if needed.    Diagnoses and all orders for this visit:    Primary hypertension  Comments:  controlled at goal  cont meds as prescribed  f/u in 6 months          No follow-ups on file.     Jerian Morais S Taniaya Rudder, APRN - CNP      The patient (or guardian, if applicable) and other individuals in attendance with the patient were advised that Artificial Intelligence will be utilized during this visit to record and process the conversation to generate a clinical note. The patient (or guardian, if applicable) and other individuals in attendance at the appointment consented to the use of AI, including the recording.                "

## 2024-03-26 ENCOUNTER — Ambulatory Visit

## 2024-03-27 ENCOUNTER — Encounter

## 2024-03-27 MED ORDER — SOLIFENACIN SUCCINATE 5 MG PO TABS
5 | ORAL_TABLET | Freq: Every day | ORAL | 0 refills | 90.00000 days | Status: AC
Start: 2024-03-27 — End: ?

## 2024-04-26 ENCOUNTER — Encounter: Payer: No Typology Code available for payment source | Admitting: Internal Medicine
